# Patient Record
Sex: Male | Born: 1940 | Race: White | Hispanic: No | Marital: Married | Smoking: Former smoker
Health system: Southern US, Community
[De-identification: ages and names within clinical notes are randomized; demographics above are authoritative.]

## PROBLEM LIST (undated history)

## (undated) DIAGNOSIS — C61 Malignant neoplasm of prostate: Secondary | ICD-10-CM

## (undated) DIAGNOSIS — E119 Type 2 diabetes mellitus without complications: Secondary | ICD-10-CM

## (undated) DIAGNOSIS — G20A1 Parkinson's disease without dyskinesia, without mention of fluctuations: Secondary | ICD-10-CM

## (undated) DIAGNOSIS — I10 Essential (primary) hypertension: Secondary | ICD-10-CM

## (undated) DIAGNOSIS — D509 Iron deficiency anemia, unspecified: Secondary | ICD-10-CM

## (undated) DIAGNOSIS — K08109 Complete loss of teeth, unspecified cause, unspecified class: Secondary | ICD-10-CM

## (undated) DIAGNOSIS — Z972 Presence of dental prosthetic device (complete) (partial): Secondary | ICD-10-CM

## (undated) DIAGNOSIS — N200 Calculus of kidney: Secondary | ICD-10-CM

## (undated) DIAGNOSIS — G20 Parkinson's disease: Secondary | ICD-10-CM

## (undated) DIAGNOSIS — E785 Hyperlipidemia, unspecified: Secondary | ICD-10-CM

## (undated) DIAGNOSIS — K219 Gastro-esophageal reflux disease without esophagitis: Secondary | ICD-10-CM

## (undated) HISTORY — DX: Calculus of kidney: N20.0

## (undated) HISTORY — DX: Parkinson's disease without dyskinesia, without mention of fluctuations: G20.A1

## (undated) HISTORY — DX: Parkinson's disease: G20

## (undated) HISTORY — PX: UPPER GI ENDOSCOPY: SHX6162

## (undated) HISTORY — DX: Type 2 diabetes mellitus without complications: E11.9

## (undated) HISTORY — DX: Iron deficiency anemia, unspecified: D50.9

## (undated) HISTORY — DX: Hyperlipidemia, unspecified: E78.5

## (undated) HISTORY — DX: Essential (primary) hypertension: I10

## (undated) HISTORY — PX: COLONOSCOPY: SHX174

## (undated) HISTORY — DX: Gastro-esophageal reflux disease without esophagitis: K21.9

## (undated) HISTORY — PX: OTHER SURGICAL HISTORY: SHX169

## (undated) HISTORY — PX: ORIF RADIAL FRACTURE: SHX5113

## (undated) HISTORY — DX: Malignant neoplasm of prostate: C61

---

## 1999-10-27 ENCOUNTER — Encounter: Payer: Self-pay | Admitting: Emergency Medicine

## 1999-10-27 ENCOUNTER — Emergency Department (HOSPITAL_COMMUNITY): Admission: EM | Admit: 1999-10-27 | Discharge: 1999-10-27 | Payer: Self-pay | Admitting: Emergency Medicine

## 2007-03-16 ENCOUNTER — Inpatient Hospital Stay (HOSPITAL_COMMUNITY): Admission: EM | Admit: 2007-03-16 | Discharge: 2007-03-19 | Payer: Self-pay | Admitting: Emergency Medicine

## 2008-07-24 IMAGING — CT CT ENTERO ABD W/CM
2 of 4 series · 16 of 46 positions shown, 18 images · IV contrast (omnipaque)
Comparison: None.

ABDOMEN CT WITH CONTRAST: (Performed using CT Enterography protocol)

CLINICAL DATA: Abdominal pain. Nausea. Vomiting. GI bleed.
TECHNIQUE: Multidetector CT imaging of the abdomen and pelvis was performed
following administration of 8116cc of VoLumen low attenuation enteric contrast. 
Images were acquired after bolus injection of nonionic intravenous contrast with
coronal and sagittal reformations reviewed for assessment of the small bowel
mucosa.

Contrast:  125 cc Omnipaque 300

[Series 3: abd_pel 3.0 b40s do not sen · axial · 0.74mm/px · z∈[-461,-14]mm · 13 of 163 slices shown, 15 images]
[im 7/163  soft-tissue]
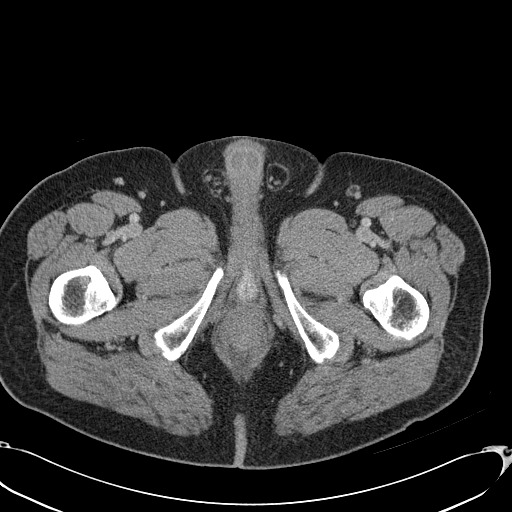
[im 7/163  bone]
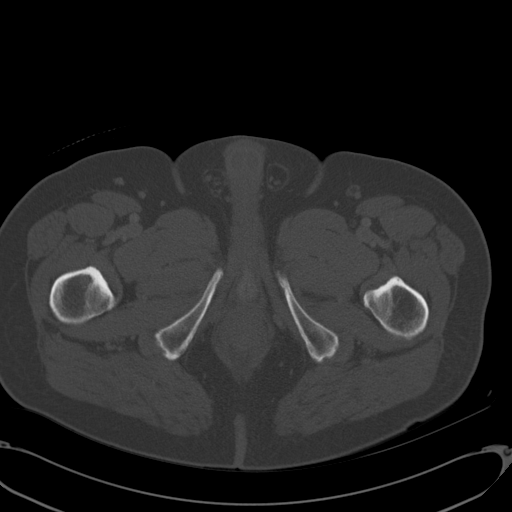
[im 19/163  soft-tissue]
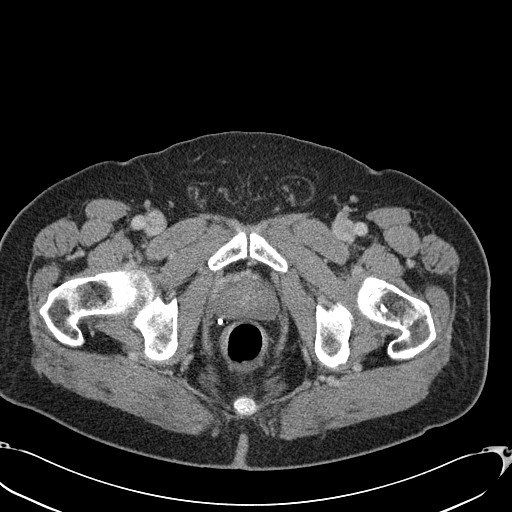
[im 32/163  soft-tissue]
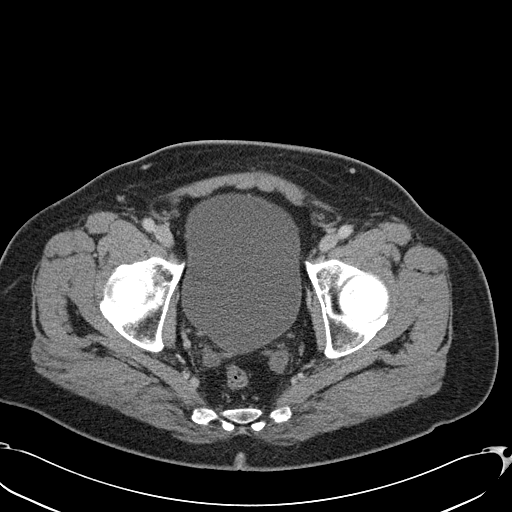
[im 44/163  soft-tissue]
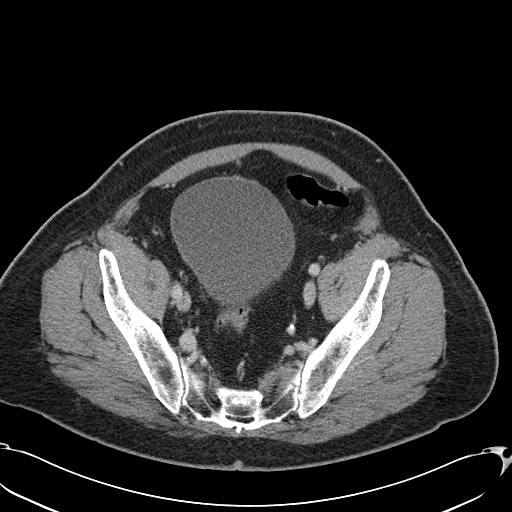
[im 57/163  soft-tissue]
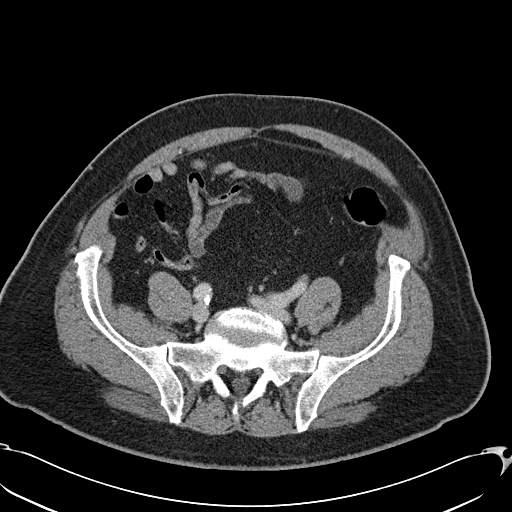
[im 69/163  soft-tissue]
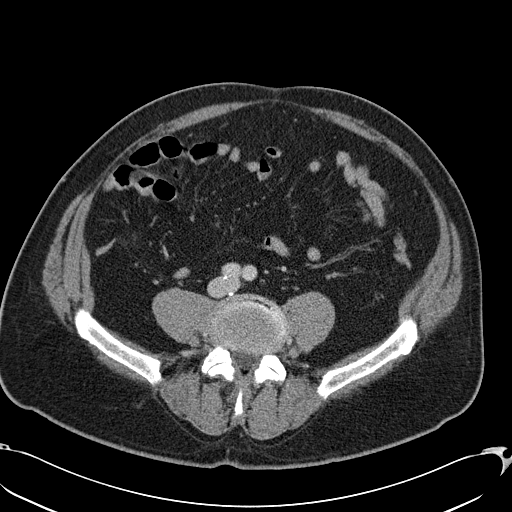
[im 82/163  soft-tissue]
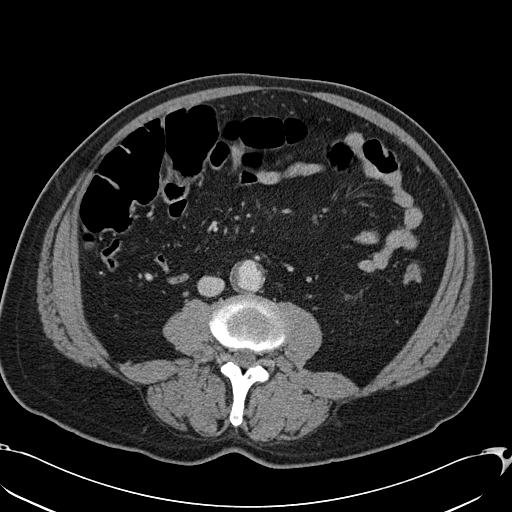
[im 94/163  soft-tissue]
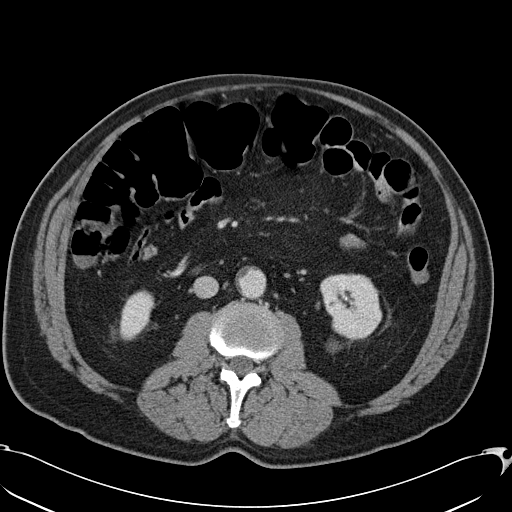
[im 106/163  soft-tissue]
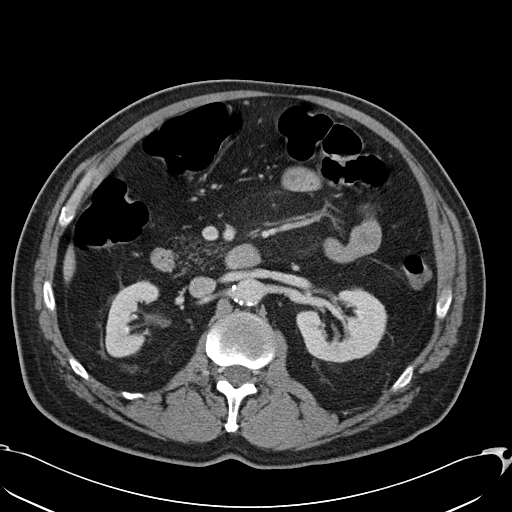
[im 106/163  bone]
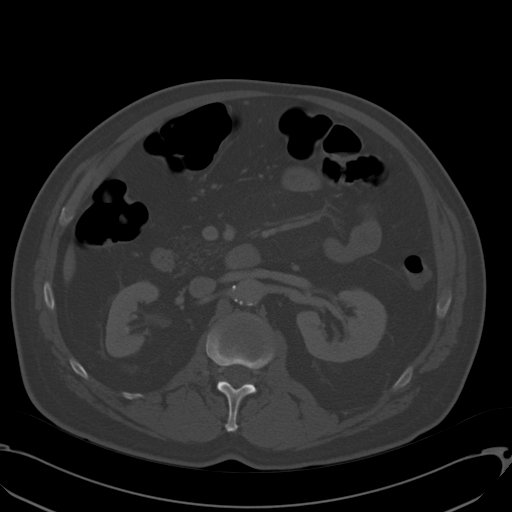
[im 119/163  soft-tissue]
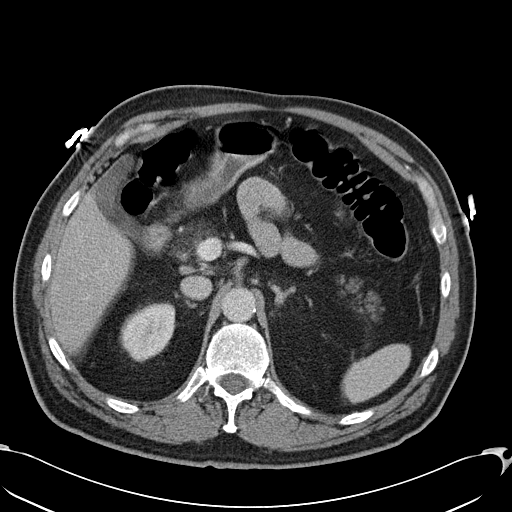
[im 131/163  soft-tissue]
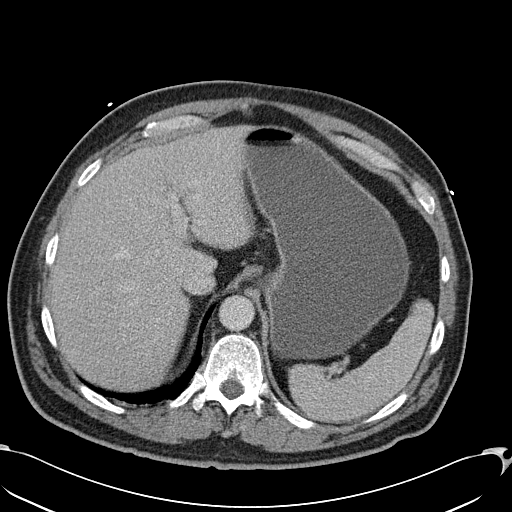
[im 144/163  soft-tissue]
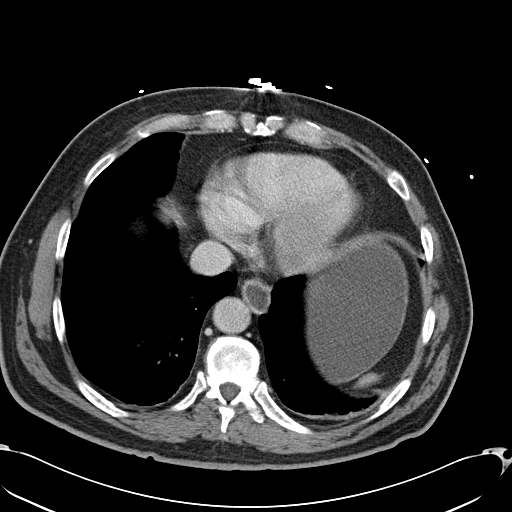
[im 156/163  soft-tissue]
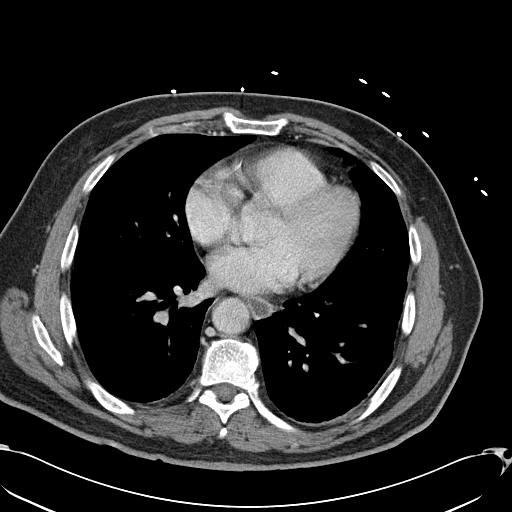

[Series 602: <mpr range> · coronal · 0.95mm/px · 3 of 133 slices shown]
[im 45/133  soft-tissue]
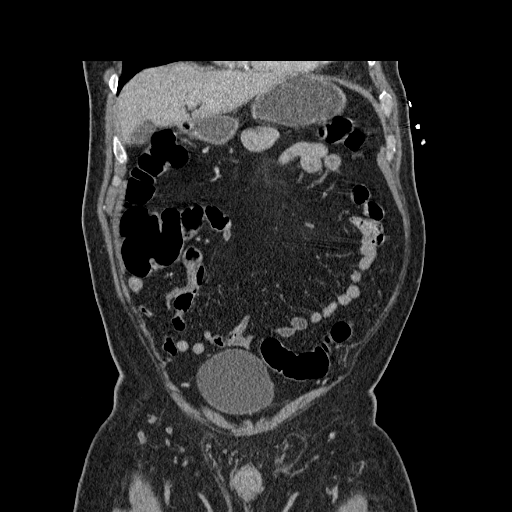
[im 59/133  soft-tissue]
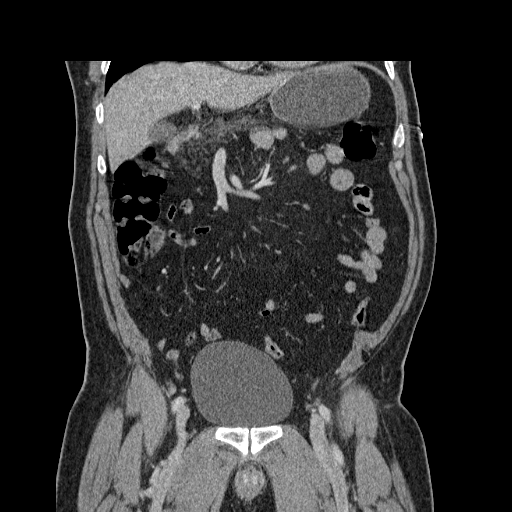
[im 74/133  soft-tissue]
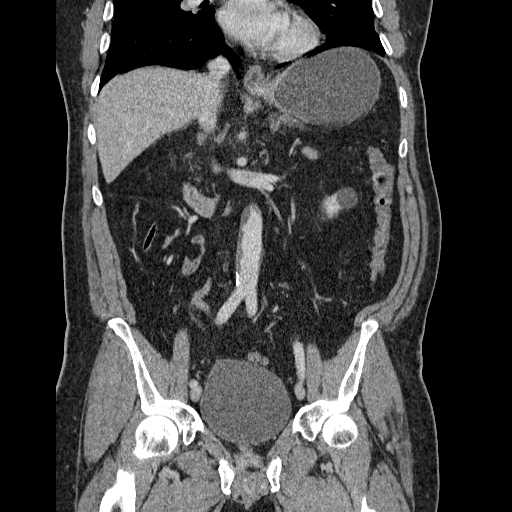

[16 of 46 positions shown; findings below may reference images not displayed]

FINDINGS: The stomach is prominently distended with oral contrast. There is oral
contrast within the visualized portions of the distal esophagus. The small bowel
is poorly distended with appearance suggesting most of the oral contrast agent
has remained in the stomach. Apparent wall thickening in the jejunum is diffuse
and there is no associated perienteric edema or inflammation. The appearance of
jejunal wall is felt to be related to underdistention. The ileal loops are
collapsed and otherwise unremarkable in appearance. The terminal ileum is
unremarkable. No areas of transmural enhancement identified within the small
bowel. Inner wall hyper enhancement may be obscured by the poor bowel
distention.

No focal abnormality is seen in the liver or spleen. The pancreas is diffusely
fatty calculated. Low attenuation diffusely in the pancreatic head is felt to be
a combination of associated fat and motion artifact. The gallbladder and adrenal
glands are unremarkable. Bilateral small nonobstructing renal calculi are
evident. Three cysts are identified in the lower pole of the left kidney.
Abdominal aorta measures up to 2.6 cm in diameter with prominent mural thrombus.
The origin of the celiac axis, superior mesenteric artery are widely patent.
There is calcification at the origin of the IMA, but it does opacify. Subtle
haziness in the root of the small bowel mesentery is a nonspecific finding.
IMPRESSION: Limited study in that all of the consumed oral contrast appears to still reside
within the stomach which is prominently distended. Functional or mechanical
gastric outlet obstruction is a consideration. Gastroparesis could also have
this appearance. There is fluid in the distal esophagus.

Bilateral nonobstructing renal calculi with left renal cysts.

PELVIS CT WITH CONTRAST:  (Performed using CT Enterography protocol)
FINDINGS: No intraperitoneal free fluid. No lymphadenopathy. The bladder is
moderately distended. No evidence for diverticulitis. The appendix is not
visualized.
IMPRESSION: No acute findings in the anatomic pelvis.

## 2008-08-02 ENCOUNTER — Ambulatory Visit (HOSPITAL_COMMUNITY): Admission: RE | Admit: 2008-08-02 | Discharge: 2008-08-02 | Payer: Self-pay | Admitting: Urology

## 2008-08-05 DIAGNOSIS — C61 Malignant neoplasm of prostate: Secondary | ICD-10-CM

## 2008-08-05 HISTORY — DX: Malignant neoplasm of prostate: C61

## 2008-08-30 ENCOUNTER — Ambulatory Visit: Admission: RE | Admit: 2008-08-30 | Discharge: 2008-09-29 | Payer: Self-pay | Admitting: Radiation Oncology

## 2008-09-15 ENCOUNTER — Ambulatory Visit: Payer: Self-pay | Admitting: Oncology

## 2008-09-15 ENCOUNTER — Emergency Department (HOSPITAL_COMMUNITY): Admission: EM | Admit: 2008-09-15 | Discharge: 2008-09-15 | Payer: Self-pay | Admitting: Emergency Medicine

## 2008-09-22 LAB — CBC & DIFF AND RETIC
BASO%: 0 % (ref 0.0–2.0)
Basophils Absolute: 0 10*3/uL (ref 0.0–0.1)
EOS%: 1.9 % (ref 0.0–7.0)
HCT: 27.8 % — ABNORMAL LOW (ref 38.7–49.9)
HGB: 9 g/dL — ABNORMAL LOW (ref 13.0–17.1)
IRF: 0.49 — ABNORMAL HIGH (ref 0.070–0.380)
MCH: 26.6 pg — ABNORMAL LOW (ref 28.0–33.4)
MCHC: 32.3 g/dL (ref 32.0–35.9)
MONO#: 0.8 10*3/uL (ref 0.1–0.9)
RDW: 19 % — ABNORMAL HIGH (ref 11.2–14.6)
RETIC #: 76.4 10*3/uL (ref 31.8–103.9)
WBC: 8.2 10*3/uL (ref 4.0–10.0)
lymph#: 2.4 10*3/uL (ref 0.9–3.3)

## 2008-09-28 LAB — SPEP & IFE WITH QIG
Albumin ELP: 55.4 % — ABNORMAL LOW (ref 55.8–66.1)
Alpha-1-Globulin: 4 % (ref 2.9–4.9)
Alpha-2-Globulin: 9.7 % (ref 7.1–11.8)
Beta 2: 7.2 % — ABNORMAL HIGH (ref 3.2–6.5)
Beta Globulin: 7.8 % — ABNORMAL HIGH (ref 4.7–7.2)
Gamma Globulin: 15.9 % (ref 11.1–18.8)
IgA: 637 mg/dL — ABNORMAL HIGH (ref 68–378)
IgG (Immunoglobin G), Serum: 1310 mg/dL (ref 694–1618)
IgM, Serum: 111 mg/dL (ref 60–263)
Total Protein, Serum Electrophoresis: 7.5 g/dL (ref 6.0–8.3)

## 2008-09-28 LAB — VITAMIN B12: Vitamin B-12: 467 pg/mL (ref 211–911)

## 2008-09-28 LAB — HEMOGLOBINOPATHY EVALUATION
Hgb A2 Quant: 2.3 % (ref 2.2–3.2)
Hgb A: 97.7 % (ref 96.8–97.8)
Hgb F Quant: 0 % (ref 0.0–2.0)
Hgb S Quant: 0 % (ref 0.0–0.0)

## 2008-09-28 LAB — FERRITIN: Ferritin: 3 ng/mL — ABNORMAL LOW (ref 22–322)

## 2008-09-28 LAB — KAPPA/LAMBDA LIGHT CHAINS: Kappa:Lambda Ratio: 0.64 (ref 0.26–1.65)

## 2008-09-28 LAB — IRON AND TIBC: %SAT: 5 % — ABNORMAL LOW (ref 20–55)

## 2008-09-28 LAB — COMPREHENSIVE METABOLIC PANEL
ALT: 10 U/L (ref 0–53)
AST: 15 U/L (ref 0–37)
Alkaline Phosphatase: 97 U/L (ref 39–117)
Creatinine, Ser: 1.2 mg/dL (ref 0.40–1.50)
Sodium: 138 mEq/L (ref 135–145)
Total Bilirubin: 0.3 mg/dL (ref 0.3–1.2)
Total Protein: 7.5 g/dL (ref 6.0–8.3)

## 2008-09-28 LAB — DIRECT ANTIGLOBULIN TEST (NOT AT ARMC)
DAT (Complement): NEGATIVE
DAT IgG: NEGATIVE

## 2008-09-28 LAB — JAK2 EXONS 12 & 13 MUTATION, REFLEX: JAK2 Exons 12 & 13: 13

## 2008-09-28 LAB — FOLATE: Folate: >20 ng/mL

## 2008-09-28 LAB — ERYTHROPOIETIN: Erythropoietin: 156 m[IU]/mL — ABNORMAL HIGH (ref 2.6–34.0)

## 2008-09-28 LAB — PNH PROFILE (-HIGH SENSITIVITY)
% CD55: 99.999 % (ref 99.995–100.000)
% PNH Cells: 0.001 % (ref 0.000–0.005)

## 2008-09-30 ENCOUNTER — Encounter (HOSPITAL_COMMUNITY): Admission: RE | Admit: 2008-09-30 | Discharge: 2008-12-08 | Payer: Self-pay | Admitting: Oncology

## 2008-09-30 LAB — CBC WITH DIFFERENTIAL/PLATELET
Basophils Absolute: 0.1 10*3/uL (ref 0.0–0.1)
Eosinophils Absolute: 0.2 10*3/uL (ref 0.0–0.5)
HCT: 25.4 % — ABNORMAL LOW (ref 38.4–49.9)
LYMPH%: 43.5 % (ref 14.0–49.0)
MCHC: 31.8 g/dL — ABNORMAL LOW (ref 32.0–36.0)
MONO#: 0.8 10*3/uL (ref 0.1–0.9)
NEUT%: 40.1 % (ref 39.0–75.0)
Platelets: 601 10*3/uL — ABNORMAL HIGH (ref 140–400)
WBC: 6.9 10*3/uL (ref 4.0–10.3)

## 2008-10-07 ENCOUNTER — Encounter: Admission: RE | Admit: 2008-10-07 | Discharge: 2008-10-07 | Payer: Self-pay | Admitting: Gastroenterology

## 2008-10-12 ENCOUNTER — Other Ambulatory Visit: Admission: RE | Admit: 2008-10-12 | Discharge: 2008-10-12 | Payer: Self-pay | Admitting: Oncology

## 2008-10-12 ENCOUNTER — Encounter: Payer: Self-pay | Admitting: Oncology

## 2008-10-12 LAB — CBC WITH DIFFERENTIAL/PLATELET
Basophils Absolute: 0 10*3/uL (ref 0.0–0.1)
EOS%: 1.9 % (ref 0.0–7.0)
HGB: 9.2 g/dL — ABNORMAL LOW (ref 13.0–17.1)
LYMPH%: 32.7 % (ref 14.0–49.0)
MCH: 27 pg — ABNORMAL LOW (ref 27.2–33.4)
MCV: 82.2 fL (ref 79.3–98.0)
MONO%: 9.2 % (ref 0.0–14.0)
Platelets: 516 10*3/uL — ABNORMAL HIGH (ref 140–400)
RBC: 3.4 10*6/uL — ABNORMAL LOW (ref 4.20–5.82)
RDW: 18.8 % — ABNORMAL HIGH (ref 11.0–14.6)

## 2008-10-19 ENCOUNTER — Encounter (INDEPENDENT_AMBULATORY_CARE_PROVIDER_SITE_OTHER): Payer: Self-pay | Admitting: Urology

## 2008-10-19 ENCOUNTER — Inpatient Hospital Stay (HOSPITAL_COMMUNITY): Admission: RE | Admit: 2008-10-19 | Discharge: 2008-10-20 | Payer: Self-pay | Admitting: Urology

## 2008-10-31 ENCOUNTER — Ambulatory Visit: Payer: Self-pay | Admitting: Oncology

## 2008-11-02 LAB — HOLD TUBE, BLOOD BANK

## 2008-11-02 LAB — CBC WITH DIFFERENTIAL/PLATELET
BASO%: 0.7 % (ref 0.0–2.0)
EOS%: 2.1 % (ref 0.0–7.0)
HCT: 36.6 % — ABNORMAL LOW (ref 38.4–49.9)
LYMPH%: 31.1 % (ref 14.0–49.0)
MCH: 26.9 pg — ABNORMAL LOW (ref 27.2–33.4)
MCHC: 32.2 g/dL (ref 32.0–36.0)
MONO#: 0.7 10*3/uL (ref 0.1–0.9)
MONO%: 8 % (ref 0.0–14.0)
NEUT%: 58.1 % (ref 39.0–75.0)
Platelets: 618 10*3/uL — ABNORMAL HIGH (ref 140–400)
RBC: 4.39 10*6/uL (ref 4.20–5.82)
WBC: 9 10*3/uL (ref 4.0–10.3)

## 2008-11-16 LAB — CBC WITH DIFFERENTIAL/PLATELET
Basophils Absolute: 0 10*3/uL (ref 0.0–0.1)
Eosinophils Absolute: 0.2 10*3/uL (ref 0.0–0.5)
HGB: 12.3 g/dL — ABNORMAL LOW (ref 13.0–17.1)
NEUT#: 2.8 10*3/uL (ref 1.5–6.5)
RDW: 20.2 % — ABNORMAL HIGH (ref 11.0–14.6)
lymph#: 3.2 10*3/uL (ref 0.9–3.3)

## 2008-12-01 LAB — CBC WITH DIFFERENTIAL/PLATELET
Eosinophils Absolute: 0.2 10*3/uL (ref 0.0–0.5)
HCT: 34.3 % — ABNORMAL LOW (ref 38.4–49.9)
HGB: 11.3 g/dL — ABNORMAL LOW (ref 13.0–17.1)
LYMPH%: 22 % (ref 14.0–49.0)
MONO#: 1 10*3/uL — ABNORMAL HIGH (ref 0.1–0.9)
NEUT#: 6 10*3/uL (ref 1.5–6.5)
NEUT%: 64.2 % (ref 39.0–75.0)
Platelets: 419 10*3/uL — ABNORMAL HIGH (ref 140–400)
WBC: 9.3 10*3/uL (ref 4.0–10.3)

## 2008-12-01 LAB — FERRITIN: Ferritin: 58 ng/mL (ref 22–322)

## 2008-12-01 LAB — HOLD TUBE, BLOOD BANK

## 2008-12-01 LAB — IRON AND TIBC
TIBC: 283 ug/dL (ref 215–435)
UIBC: 205 ug/dL

## 2009-01-27 ENCOUNTER — Ambulatory Visit: Payer: Self-pay | Admitting: Oncology

## 2009-01-31 LAB — IRON AND TIBC: %SAT: 30 % (ref 20–55)

## 2009-01-31 LAB — CBC WITH DIFFERENTIAL/PLATELET
Basophils Absolute: 0 10*3/uL (ref 0.0–0.1)
EOS%: 1 % (ref 0.0–7.0)
Eosinophils Absolute: 0.1 10*3/uL (ref 0.0–0.5)
HCT: 40.1 % (ref 38.4–49.9)
HGB: 13.9 g/dL (ref 13.0–17.1)
MCH: 32 pg (ref 27.2–33.4)
MONO#: 0.8 10*3/uL (ref 0.1–0.9)
NEUT#: 4.4 10*3/uL (ref 1.5–6.5)
NEUT%: 50.3 % (ref 39.0–75.0)
RDW: 16.6 % — ABNORMAL HIGH (ref 11.0–14.6)
lymph#: 3.4 10*3/uL — ABNORMAL HIGH (ref 0.9–3.3)

## 2009-01-31 LAB — FERRITIN: Ferritin: 24 ng/mL (ref 22–322)

## 2009-03-28 ENCOUNTER — Ambulatory Visit: Payer: Self-pay | Admitting: Oncology

## 2009-05-26 ENCOUNTER — Ambulatory Visit: Payer: Self-pay | Admitting: Oncology

## 2009-05-31 LAB — IRON AND TIBC
%SAT: 29 % (ref 20–55)
Iron: 86 ug/dL (ref 42–165)

## 2009-05-31 LAB — COMPREHENSIVE METABOLIC PANEL
ALT: 15 U/L (ref 0–53)
AST: 18 U/L (ref 0–37)
CO2: 23 mEq/L (ref 19–32)
Chloride: 101 mEq/L (ref 96–112)
Sodium: 137 mEq/L (ref 135–145)
Total Bilirubin: 0.6 mg/dL (ref 0.3–1.2)
Total Protein: 7.4 g/dL (ref 6.0–8.3)

## 2009-05-31 LAB — CBC WITH DIFFERENTIAL/PLATELET
BASO%: 0.5 % (ref 0.0–2.0)
EOS%: 1.7 % (ref 0.0–7.0)
LYMPH%: 37.4 % (ref 14.0–49.0)
MCH: 33 pg (ref 27.2–33.4)
MCHC: 33.9 g/dL (ref 32.0–36.0)
MONO#: 1 10*3/uL — ABNORMAL HIGH (ref 0.1–0.9)
RBC: 4.18 10*6/uL — ABNORMAL LOW (ref 4.20–5.82)
WBC: 8.1 10*3/uL (ref 4.0–10.3)
lymph#: 3 10*3/uL (ref 0.9–3.3)

## 2009-05-31 LAB — FERRITIN: Ferritin: 50 ng/mL (ref 22–322)

## 2009-07-25 ENCOUNTER — Ambulatory Visit: Payer: Self-pay | Admitting: Oncology

## 2009-07-27 LAB — IRON AND TIBC
%SAT: 37 % (ref 20–55)
TIBC: 336 ug/dL (ref 215–435)
UIBC: 213 ug/dL

## 2009-07-27 LAB — CBC WITH DIFFERENTIAL/PLATELET
Basophils Absolute: 0 10*3/uL (ref 0.0–0.1)
EOS%: 0.6 % (ref 0.0–7.0)
Eosinophils Absolute: 0.1 10*3/uL (ref 0.0–0.5)
HCT: 44.3 % (ref 38.4–49.9)
HGB: 15.1 g/dL (ref 13.0–17.1)
MCH: 32.9 pg (ref 27.2–33.4)
MCV: 96.8 fL (ref 79.3–98.0)
MONO%: 8.1 % (ref 0.0–14.0)
NEUT#: 7 10*3/uL — ABNORMAL HIGH (ref 1.5–6.5)
NEUT%: 65.2 % (ref 39.0–75.0)
RDW: 12.9 % (ref 11.0–14.6)
lymph#: 2.8 10*3/uL (ref 0.9–3.3)

## 2009-07-27 LAB — COMPREHENSIVE METABOLIC PANEL
AST: 23 U/L (ref 0–37)
Albumin: 4.5 g/dL (ref 3.5–5.2)
BUN: 18 mg/dL (ref 6–23)
Calcium: 10.1 mg/dL (ref 8.4–10.5)
Chloride: 100 mEq/L (ref 96–112)
Creatinine, Ser: 0.96 mg/dL (ref 0.40–1.50)
Glucose, Bld: 129 mg/dL — ABNORMAL HIGH (ref 70–99)
Potassium: 4 mEq/L (ref 3.5–5.3)

## 2010-02-23 IMAGING — CR DG CHEST 2V
2 series · 2 of 2 positions shown · non-contrast
Comparison: 03/16/2007

CLINICAL DATA: Prostate cancer, preop.

CHEST - 2 VIEW

[w chest pa]
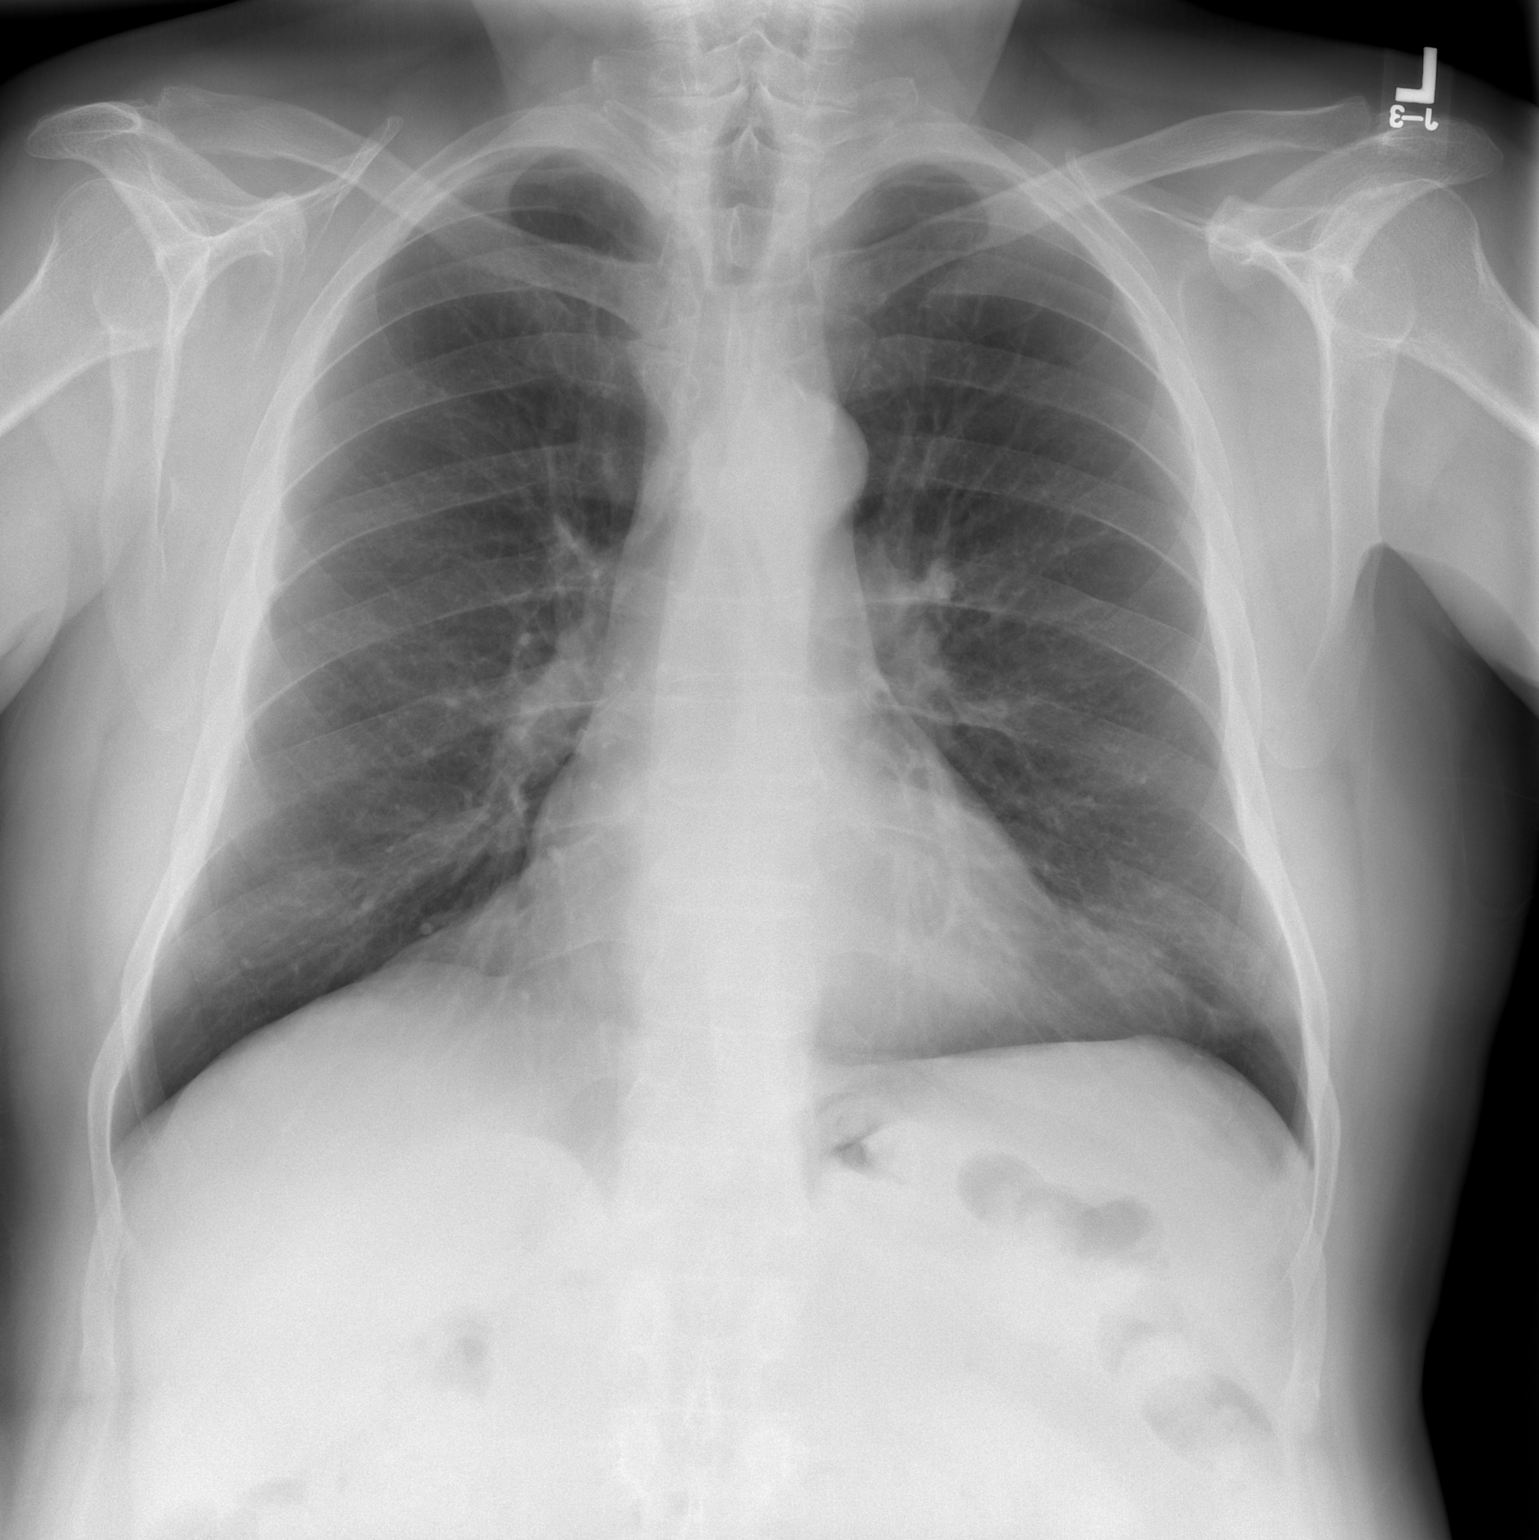

[w chest lat]
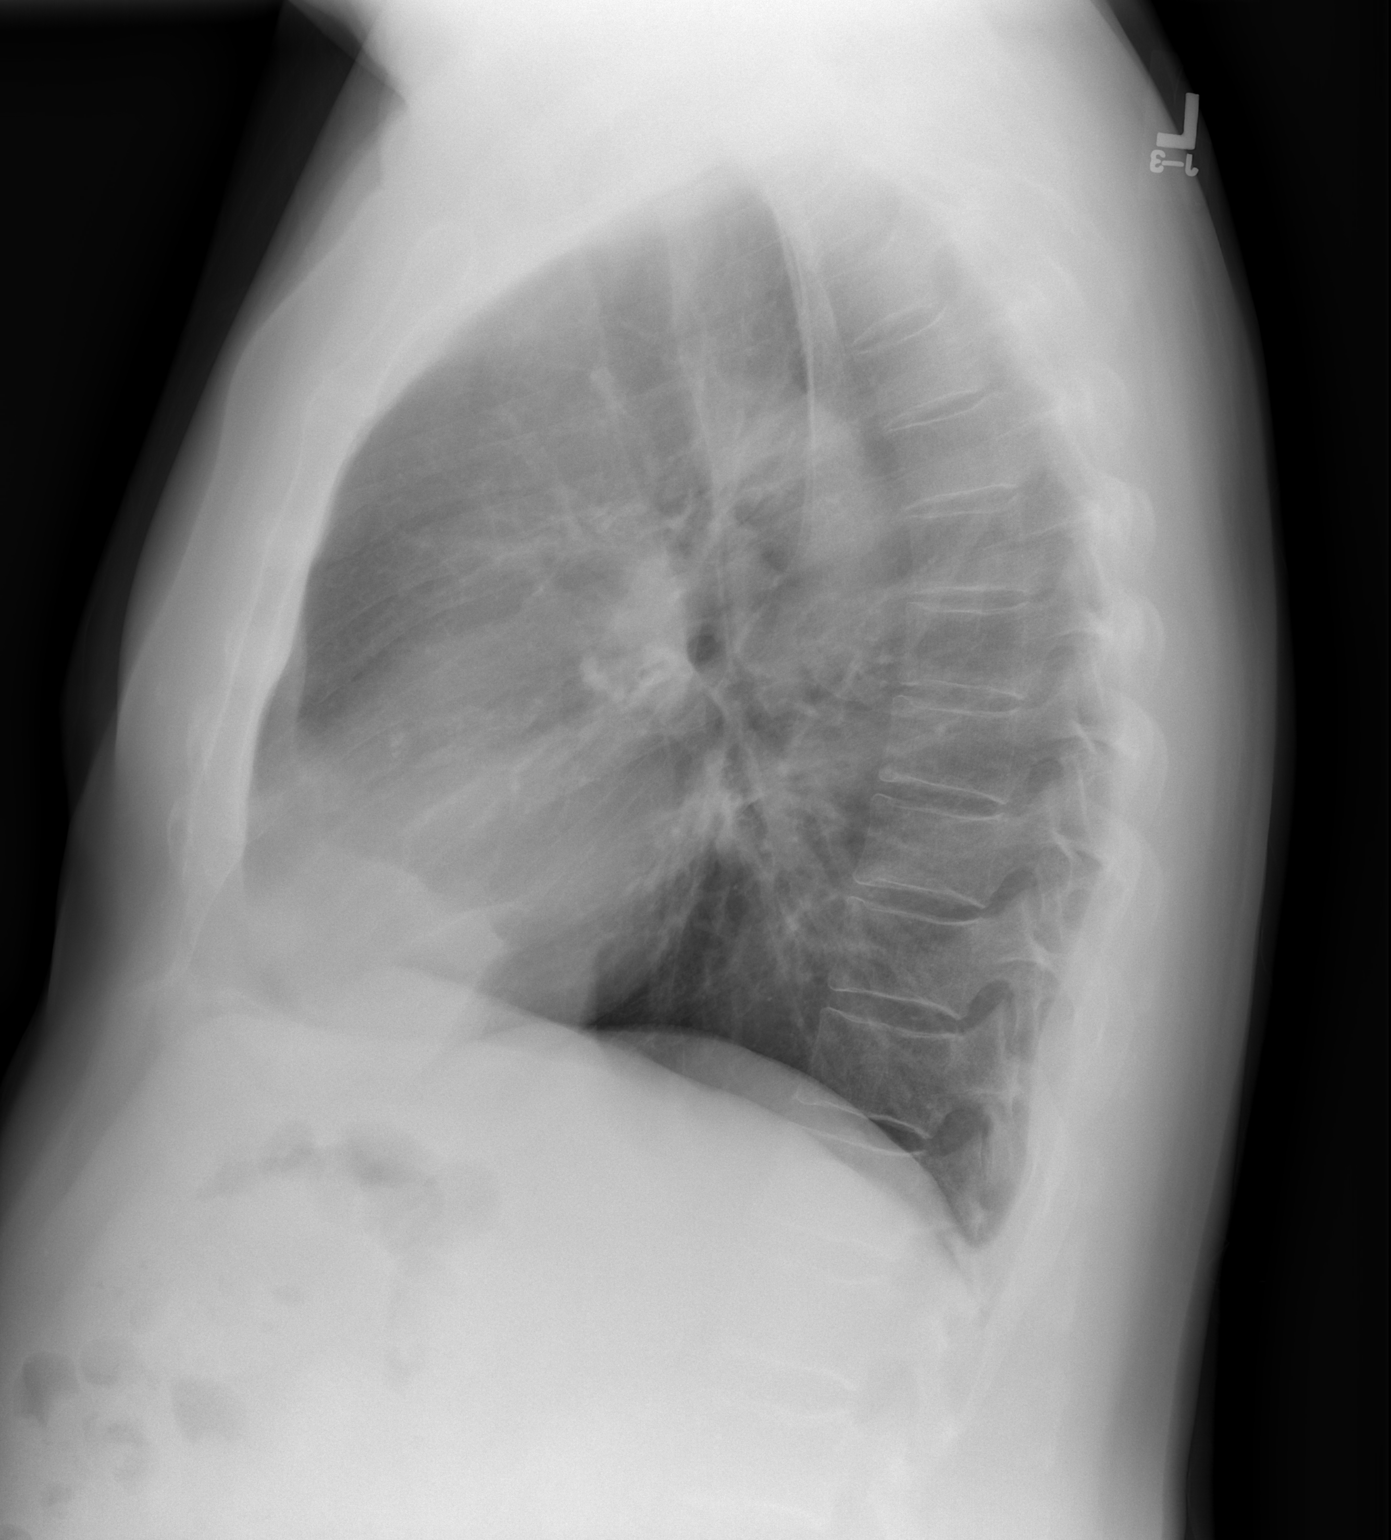

[2 of 2 positions shown; findings below may reference images not displayed]

FINDINGS: Heart and mediastinal contours are within normal limits.
No focal opacities or effusions.  No acute bony abnormality.
IMPRESSION: No active disease.

## 2010-03-21 ENCOUNTER — Encounter: Payer: Self-pay | Admitting: Cardiology

## 2010-04-05 ENCOUNTER — Ambulatory Visit: Payer: Self-pay | Admitting: Cardiology

## 2010-04-05 DIAGNOSIS — E1159 Type 2 diabetes mellitus with other circulatory complications: Secondary | ICD-10-CM | POA: Insufficient documentation

## 2010-04-05 DIAGNOSIS — R9431 Abnormal electrocardiogram [ECG] [EKG]: Secondary | ICD-10-CM | POA: Insufficient documentation

## 2010-04-05 DIAGNOSIS — E663 Overweight: Secondary | ICD-10-CM | POA: Insufficient documentation

## 2010-04-05 DIAGNOSIS — R5383 Other fatigue: Secondary | ICD-10-CM

## 2010-04-05 DIAGNOSIS — E1169 Type 2 diabetes mellitus with other specified complication: Secondary | ICD-10-CM | POA: Insufficient documentation

## 2010-04-05 DIAGNOSIS — I10 Essential (primary) hypertension: Secondary | ICD-10-CM | POA: Insufficient documentation

## 2010-04-05 DIAGNOSIS — I152 Hypertension secondary to endocrine disorders: Secondary | ICD-10-CM | POA: Insufficient documentation

## 2010-04-05 DIAGNOSIS — R5381 Other malaise: Secondary | ICD-10-CM | POA: Insufficient documentation

## 2010-04-05 DIAGNOSIS — E785 Hyperlipidemia, unspecified: Secondary | ICD-10-CM | POA: Insufficient documentation

## 2010-09-04 NOTE — Assessment & Plan Note (Signed)
Summary: np6/rbbb/jml   Visit Type:  Initial Consult Primary Helana Macbride:  Paul [name], PAc  CC:  abnormal ekg.  History of Present Illness: The she presents for evaluation of an abnormal EKG.  He was recently noted to have a right bundle branch block on routine followup. He has no prior cardiac history. He has never had any cardiac testing. He is an active gentleman working in his garden and around his house though he does not exercise routinely. With this level of activity he denies any chest pressure, neck or arm discomfort. He has no palpitations, presyncope or syncope. He has no PND or orthopnea. He denies any shortness of breath with activity. He has no weight gain or swelling. Of note I don't have an old EKG for comparison.  Preventive Screening-Counseling & Management  Alcohol-Tobacco     Smoking Status: quit     Year Quit: 1980  Current Medications (verified): 1)  Lisinopril 20 Mg Tabs (Lisinopril) .... Take 1 Tablet By Mouth Once A Day 2)  Aspir-Low 81 Mg Tbec (Aspirin) .... Take 1 Tablet By Mouth Once A Day 3)  Amlodipine Besylate 5 Mg Tabs (Amlodipine Besylate) .... Take 1 Tablet By Mouth Once A Day 4)  Lipitor 40 Mg Tabs (Atorvastatin Calcium) .... Take 1 Tablet By Mouth Once A Day 5)  Multivitamins  Tabs (Multiple Vitamin) .... Take 1 Tablet By Mouth Once A Day 6)  Fish Oil 1200 Mg Caps (Omega-3 Fatty Acids) .... Take 1 Tablet By Mouth Once A Day 7)  Drisdol 01027 Unit Caps (Ergocalciferol) .... Take One By Mouth Weekly  Allergies (verified): 1)  ! Sulfa 2)  ! * Norvasc 10mg   Comments:  Nurse/Medical Assistant: The patient's medication list and allergies were reviewed with the patient and were updated in the Medication and Allergy Lists.  Past History:  Past Medical History: Hypertension x 5 years Hyperlipidemia x 5 years Kdney stones Low-grade adenocarcinoma of the prostate.   Past Surgical History: Robotic-assisted laparoscopic radical  prostatectomy and  bilateral pelvic lymphadenectomy.     Family History: Mother died at 102 years of age of heart failure, diabetes mellitus.  Father died at 5 years of age of heart attack. Brother with stents 60.     Social History: Married, Tobacco x 33 years 1 ppd quit 1983, No EtOHSmoking Status:  quit  Review of Systems       As stated in the HPI and negative for all other systems.   Vital Signs:  Patient profile:   70 year old male Height:      68 inches Weight:      199 pounds BMI:     30.37 Pulse rate:   90 / minute BP sitting:   119 / 73  (left arm) Cuff size:   large  Vitals Entered By: Carlye Grippe (April 05, 2010 1:03 PM)  Nutrition Counseling: Patient's BMI is greater than 25 and therefore counseled on weight management options. CC: abnormal ekg   Physical Exam  General:  Well developed, well nourished, in no acute distress. Head:  normocephalic and atraumatic Eyes:  PERRLA/EOM intact; conjunctiva and lids normal. Neck:  Neck supple, no JVD. No masses, thyromegaly or abnormal cervical nodes. Chest Wall:  no deformities or breast masses noted Lungs:  Clear bilaterally to auscultation and percussion. Abdomen:  Bowel sounds positive; abdomen soft and non-tender without masses, organomegaly, or hernias noted. No hepatosplenomegaly. Msk:  Back normal, normal gait. Muscle strength and tone normal. Extremities:  No clubbing  or cyanosis. Neurologic:  Alert and oriented x 3. Skin:  Intact without lesions or rashes. Cervical Nodes:  no significant adenopathy Axillary Nodes:  no significant adenopathy Inguinal Nodes:  no significant adenopathy Psych:  Normal affect.   Detailed Cardiovascular Exam  Neck    Carotids: Carotids full and equal bilaterally without bruits.      Neck Veins: Normal, no JVD.    Heart    Inspection: no deformities or lifts noted.      Palpation: normal PMI with no thrills palpable.      Auscultation: regular rate and rhythm, S1, S2 without  murmurs, rubs, gallops, or clicks.    Vascular    Abdominal Aorta: no palpable masses, pulsations, or audible bruits.      Femoral Pulses: normal femoral pulses bilaterally.      Pedal Pulses: normal pedal pulses bilaterally.      Radial Pulses: normal radial pulses bilaterally.      Peripheral Circulation: no clubbing, cyanosis, or edema noted with normal capillary refill.     EKG  Procedure date:  04/05/2010  Findings:      Sinus rhythm, rate 69,right bundle branch block, right axis deviation, no acute ST-T wave changes  Impression & Recommendations:  Problem # 1:  ABNORMAL ELECTROCARDIOGRAM (ICD-794.31) The patient has a right bundle branch block which I assume is new. However, he has no other conduction abnormalities. He has a normal physical examination and no symptoms. In this situation further structural heart disease is not suspected. No further cardiovascular testing is suggested.  Problem # 2:  OVERWEIGHT (ICD-278.02) We discussed the need for weight loss with diet and exercise.  Problem # 3:  ESSENTIAL HYPERTENSION, BENIGN (ICD-401.1) His blood pressure is controlled. He will continue the meds as listed.  Problem # 4:  HYPERLIPIDEMIA-MIXED (ICD-272.4) I reviewed his lipid profile from the outside. While his LDL is not significantly out of range his particle size is not optimal. I agree with current management and aggressive followup.  Patient Instructions: 1)  No further cardiac work up needed.

## 2010-09-04 NOTE — Letter (Signed)
Summary: External Correspondence/ [facility]  External Correspondence/ [facility]   Imported By: Dorise Hiss 03/27/2010 12:37:45  _____________________________________________________________________  External Attachment:    Type:   Image     Comment:   External Document

## 2010-11-15 LAB — COMPREHENSIVE METABOLIC PANEL
ALT: 17 U/L (ref 0–53)
AST: 23 U/L (ref 0–37)
Alkaline Phosphatase: 81 U/L (ref 39–117)
CO2: 28 mEq/L (ref 19–32)
Calcium: 9.5 mg/dL (ref 8.4–10.5)
Chloride: 99 mEq/L (ref 96–112)
GFR calc Af Amer: >60 mL/min (ref >60)
GFR calc non Af Amer: >60 mL/min (ref >60)
Glucose, Bld: 108 mg/dL — ABNORMAL HIGH (ref 70–99)
Potassium: 3.7 mEq/L (ref 3.5–5.1)
Sodium: 136 mEq/L (ref 135–145)
Total Bilirubin: 0.6 mg/dL (ref 0.3–1.2)

## 2010-11-15 LAB — CHROMOSOME ANALYSIS, BONE MARROW

## 2010-11-15 LAB — BASIC METABOLIC PANEL
BUN: 11 mg/dL (ref 6–23)
BUN: 16 mg/dL (ref 6–23)
Calcium: 8.5 mg/dL (ref 8.4–10.5)
Creatinine, Ser: 1.09 mg/dL (ref 0.4–1.5)
GFR calc non Af Amer: >60 mL/min (ref >60)
GFR calc non Af Amer: >60 mL/min (ref >60)
Glucose, Bld: 140 mg/dL — ABNORMAL HIGH (ref 70–99)
Potassium: 3.9 mEq/L (ref 3.5–5.1)
Potassium: 4.4 mEq/L (ref 3.5–5.1)

## 2010-11-15 LAB — HEMOGLOBIN AND HEMATOCRIT, BLOOD
HCT: 29.7 % — ABNORMAL LOW (ref 39.0–52.0)
Hemoglobin: 9.7 g/dL — ABNORMAL LOW (ref 13.0–17.0)

## 2010-11-15 LAB — CBC
HCT: 23.6 % — ABNORMAL LOW (ref 39.0–52.0)
HCT: 28.5 % — ABNORMAL LOW (ref 39.0–52.0)
Hemoglobin: 9.4 g/dL — ABNORMAL LOW (ref 13.0–17.0)
MCHC: 32.3 g/dL (ref 30.0–36.0)
MCV: 83.1 fL (ref 78.0–100.0)
MCV: 83.9 fL (ref 78.0–100.0)
Platelets: 421 10*3/uL — ABNORMAL HIGH (ref 150–400)
Platelets: 522 10*3/uL — ABNORMAL HIGH (ref 150–400)
RBC: 3.4 MIL/uL — ABNORMAL LOW (ref 4.22–5.81)
RBC: 3.52 MIL/uL — ABNORMAL LOW (ref 4.22–5.81)
RDW: 20 % — ABNORMAL HIGH (ref 11.5–15.5)
WBC: 10 10*3/uL (ref 4.0–10.5)
WBC: 6.8 10*3/uL (ref 4.0–10.5)
WBC: 7.3 10*3/uL (ref 4.0–10.5)

## 2010-11-15 LAB — DIFFERENTIAL
Eosinophils Absolute: 0.1 10*3/uL (ref 0.0–0.7)
Eosinophils Relative: 2 % (ref 0–5)
Lymphocytes Relative: 16 % (ref 12–46)
Lymphocytes Relative: 34 % (ref 12–46)
Lymphs Abs: 1.6 10*3/uL (ref 0.7–4.0)
Lymphs Abs: 2.4 10*3/uL (ref 0.7–4.0)
Monocytes Relative: 9 % (ref 3–12)
Neutrophils Relative %: 71 % (ref 43–77)
Neutrophils Relative %: 56 % (ref 43–77)

## 2010-11-15 LAB — URINALYSIS, ROUTINE W REFLEX MICROSCOPIC
Bilirubin Urine: NEGATIVE
Nitrite: NEGATIVE
Specific Gravity, Urine: 1.008 (ref 1.005–1.030)
pH: 7 (ref 5.0–8.0)

## 2010-11-15 LAB — PROTIME-INR: Prothrombin Time: 13.2 seconds (ref 11.6–15.2)

## 2010-11-15 LAB — TYPE AND SCREEN: ABO/RH(D): O POS

## 2010-11-20 LAB — CROSSMATCH

## 2010-12-18 NOTE — Discharge Summary (Signed)
NAME:  Paul [name], KINA NO.:  [id number]   MEDICAL RECORD NO.:  [id number]          PATIENT TYPE:  INP   LOCATION:  1406                         FACILITY:  [facility]   PHYSICIAN:  Lonia Blood, M.D.      DATE OF BIRTH:  1940/11/19   DATE OF ADMISSION:  03/16/2007  DATE OF DISCHARGE:  03/19/2007                               DISCHARGE SUMMARY   PRIMARY CARE PHYSICIAN:  Dr. Molly Maduro Day.   DISCHARGE DIAGNOSIS:  1. Profound anemia, profound etiology.  2. Hypertension.  3. Hyperlipidemia.  4. Kidney stones.   DISCHARGE MEDICATIONS:  Vytorin 1 tablet daily, amlodipine/benazepril  50/20 1 tablet daily, multivitamin daily, aspirin 1 mg daily and iron  tablet daily.   DISPOSITION:  The patient was followed with Dr. Morrie Sheldon in about a week.  He  subsequently had a hemoglobin followup.  If he continues to drop he will  need to have hematology workup.  At this point there is no evidence of  GI source of bleeding.   PROCEDURES PERFORMED:  This admission.  Chest x-ray on March 16, 2007  showed no acute cardiopulmonary findings.  CT entero, abdomen and pelvis  on March 18, 2007 showed limited study, but not negative finding except  for bilateral nonobstructive renal calculi and left renal cyst.  Also  EGD and colonoscopy on March 18, 2007 by Dr. Herbert Moors was negative.   CONSULTATIONS:  Dr. Evette Cristal, gastroenterologist.   BRIEF HISTORY AND PHYSICAL:  Please refer to dictated history and  physical on March 16, 2007 by Dr. Elliot Cousin.  In short however,  this is a 70 year old gentleman that presented with weakness and some  elements of dizziness.  He was seen in the emergency room, and initial  evaluation showed that he was profoundly anemic with hemoglobin of 7.4.  He was heme-negative.  Anemia was microcytic with an MCV of 70.  The  patient was subsequently admitted for further workup of microcytic  anemia.   HOSPITAL COURSE:  1. Profound microcytic anemia.  The first  impression was this is      likely to be GI based on the patient's age, and the fact that this      is microcytic anemia.  The patient's guaiac was negative as      indicated.  He received 2 units of packed red blood cells and also      a GI consult.  He had workup done including EGD, colonoscopy and CT      scan arthroscopy which are all negative.  This indicated his source      of bleeding is likely to be GI  based on these studies.  We will      now discharge him, and his hemoglobin will be followed as an      outpatient.  If he continues dropping, hematology workup is      warranted.  2. Hypertension.  His blood pressure was well-controlled his home      medications.  3. Dyslipidemia.  The patient was continued on Vytorin once his oral  intake resumes.  4. Hypokalemia and hyponatremia.  Again, he had transient hypokalemia      and hyponatremia on admission with potassium 3.4 and a sodium of      131.  This was likely secondary to dehydration which has since      resolved.  Otherwise, patient quite stable for discharge.      Lonia Blood, M.D.  Electronically Signed     LG/MEDQ  D:  03/19/2007  T:  03/20/2007  Job:  [id number]

## 2010-12-18 NOTE — Op Note (Signed)
NAME:  Paul [name], Paul [name] NO.:  [id number]   MEDICAL RECORD NO.:  [id number]          PATIENT TYPE:  INP   LOCATION:  1406                         FACILITY:  [facility]   PHYSICIAN:  Graylin Shiver, M.D.   DATE OF BIRTH:  Feb 22, 1941   DATE OF PROCEDURE:  03/18/2007  DATE OF DISCHARGE:                               OPERATIVE REPORT   PROCEDURE PERFORMED:  Esophagogastroduodenoscopy.   INDICATIONS:  Anemia.  Informed consent was obtained after explanation  of the risks of bleeding, infection and perforation.   PREMEDICATION:  Fentanyl 50 mcg IV, Versed 4 mg IV.   PROCEDURE:  With the patient in the left lateral decubitus position, the  Pentax gastroscope was inserted into the oropharynx and passed into the  esophagus. It was advanced down the esophagus then into the stomach and  into the duodenum.  The second portion and bulb of the duodenum were  normal.  The stomach looked normal in its entirety.  The esophagus  looked normal in its entirety.  He tolerated the procedure well without  complications.   IMPRESSION:  Normal esophagogastroduodenoscopy.           ______________________________  Graylin Shiver, M.D.     SFG/MEDQ  D:  03/18/2007  T:  03/19/2007  Job:  [id number]

## 2010-12-18 NOTE — H&P (Signed)
NAME:  RAKIN, LEMELLE NO.:  [id number]   MEDICAL RECORD NO.:  [id number]          PATIENT TYPE:  INP   LOCATION:  0107                         FACILITY:  [facility]   PHYSICIAN:  Elliot Cousin, M.D.    DATE OF BIRTH:  August 28, 1940   DATE OF ADMISSION:  03/16/2007  DATE OF DISCHARGE:                              HISTORY & PHYSICAL   PRIMARY CARE PHYSICIAN:  Alfredia Client, MD, [facility].   CHIEF COMPLAINT:  Swimmy headed and weak.   HISTORY OF PRESENT ILLNESS:  The patient is a 70 year old man with a  past medical history significant for hypertension and hyperlipidemia,  who presents to the emergency department with the Chief Complaint of  feeling swimmy headed and weak.  His symptoms started approximately a  week to two weeks ago.  He also complains of no energy.  He has had  intermittent blurred vision but no headache or syncopal episodes.  He  had a sore throat which radiated to the chest while he was eating dinner  yesterday.  He has had a mild productive cough with clear and greenish  sputum.  He denies associated fever and chills.  He has had no abdominal  pain, although he has had nausea.  He denies vomiting.  He denies  diarrhea and constipation.  The stools have been mostly brown with the  exception on one which was maroon colored yesterday.  He says that he  has hemorrhoids.  He denies shortness of breath, swelling in the legs,  and weight loss.  He denies using NSAIDs frequently.  He generally takes  Tylenol if needed for pain.  He also denies alcohol use.   The patient presented to his primary care physician today.  Ms. Bevelyn Buckles  evaluated the patient in the office.  At that time, the patient was  hemodynamically stable, and his abdomen was nontender per exam.  Ms.  Bevelyn Buckles performed a rectal exam, and his stool was apparently heme  negative.  Ms. Bevelyn Buckles did see small external hemorrhoids.  A CBC was  ordered.  His hemoglobin was  found to be 6.4 with an MCV of 71.5.  The  patient was advised to come to the emergency department for further  evaluation.   When the patient arrived to the emergency department, he was found to be  hemodynamically stable.  His hemoglobin per the CBC in the emergency  department is 7.0.  The patient will be admitted for further evaluation  and management.   PAST MEDICAL HISTORY:  1. Hypertension.  2. Hyperlipidemia.  3. Kidney stones.   MEDICATIONS:  1. Amlodipine/benazepril 5/20 mg daily.  2. Vytorin 10/20 mg daily.  3. Multivitamins once daily.  4. Aspirin 81 mg daily.   ALLERGIES:  No known drug allergies.   SOCIAL HISTORY:  The patient is married. [place]  He has one son.  He is retired.  He stopped smoking 25 years  ago.  He denies alcohol use.   FAMILY HISTORY:  His mother died at 70 years of age of heart  failure.  She also had diabetes mellitus.  His father died at 98 years of age of  heart attack.   REVIEW OF SYSTEMS:  Otherwise negative.   PHYSICAL EXAMINATION:  VITAL SIGNS:  Temperature 98.9, blood pressure  151/84, pulse 93, respiratory rate 20, oxygen saturation 97% on room  air.  GENERAL:  The patient is a pleasant, 70 year old,  Caucasian man who is  currently lying in bed in no acute distress.  HEENT:  Head is normocephalic and atraumatic.  Pupils equal, round, and  reactive to light. Extraocular movements are intact.  Conjunctivae are  clear, sclerae white.  Nasal mucosa is mildly dry.  No sinus tenderness.  Oropharynx reveals a full set of dentures. Mucous membranes are mildly  dry.  No posterior exudates or erythema.  His mucous membranes are pale  in color.  NECK:  Supple.  No adenopathy, no thyromegaly, no bruit, no JVD.  LUNGS:  Clear to auscultation bilaterally.  HEART:  S1, S2, with no murmurs, rubs, or gallops.  ABDOMEN:  Mild obese, positive bowel sounds.  Soft, nontender,  nondistended.  No hepatosplenomegaly,  no masses palpated.  RECTAL/GU:  Deferred.  EXTREMITIES:  Pedal pulse on the right 2+, pedal pulse on the left 1+.  No pretibial edema and no pedal edema.  NEUROLOGIC:  The patient is alert and oriented x3.  Cranial nerves II-  XII intact.  Strength is 5/5 throughout.  Sensation is intact.  No  nystagmus.   ADMISSION LABORATORY DATA:  Chest x-ray pending.   EKG reveals normal sinus rhythm with a questionable incomplete right  bundle branch block,  heart rate 82 beats per minute.   WBC 6.4, hemoglobin 7.0, MCV 70.3, platelets 551.  Sodium 131, potassium  3.4, chloride 98, CO2 25, glucose 197, BUN 10, creatinine 1.02, calcium  9.0, total protein 7.4, ALT 13, AST 24, albumin 3.8.  PT 13.1, INR  1.0,  PTT 26.   ASSESSMENT:  1. Profound microcytic anemia.  The etiology is unclear at this time.      More than likely, the patient's anemia is from a gastrointestinal      source.  Of note, his stool was heme negative per Ms. Steadman's      exam in the office at [facility] today.  The patient is      currently hemodynamically stable.  His hemoglobin is 7 and his MCV      is 70.3.  2. Mild thrombocytosis.  The patient's platelet count is 551. More      than likely, the thrombocytosis is secondary to anemia.  3. Hypokalemia and hyponatremia.  The patient's serum potassium is 3.4      and his serum sodium is 131.   PLAN:  1. The patient will be admitted for further evaluation and management.  2. Will type and cross 4 units of packed red blood cells and transfuse      2 tonight.  3. Will continue to monitor his hemoglobin and hematocrit.  We will      also order iron studies.  4. Consult GI (gastroenterologist, Dr. Randa Evens, has already been      notified).  5. Will guaiac stools x3.  6. Replete potassium and sodium via normal saline with potassium      chloride added.  7. Prophylactic Protonix.  8. Will add bilateral lower extremity compression devices for DVT       prophylaxis.      Elliot Cousin, M.D.  Electronically Signed  DF/MEDQ  D:  03/16/2007  T:  03/16/2007  Job:  [id number]   cc:   Alfredia Client, MD  Fax: [phone]

## 2010-12-18 NOTE — Consult Note (Signed)
NAME:  Paul [name], Paul [name] NO.:  [id number]   MEDICAL RECORD NO.:  [id number]          PATIENT TYPE:  INP   LOCATION:  1406                         FACILITY:  [facility]   PHYSICIAN:  Graylin Shiver, M.D.   DATE OF BIRTH:  [birth year]   DATE OF CONSULTATION:  DATE OF DISCHARGE:                                 CONSULTATION   We were asked to see Paul [name] today in consultation for a possible GI  bleed and anemia, by Dr. Elliot Cousin.   HPI:  This is a 70 year old gentleman with a history of hypertension,  hyperlipidemia and kidney stones who began feeling weak and dizzy  approximately 2 weeks ago.  He was found in his primary care physician's  office to have a hemoglobin of 6.4 with an MCV value of 71.5.  He  reports positive acid indigestion and occasional episodes of dysphagia.  He had a bad episode of both acid indigestion and dysphagia this past  Sunday at a family reunion.  He also had an episode of maroon stools on  Sunday.  However, he reports no previous history of maroon stools or  melena.  No hematemesis.  Reports that he does not use NSAID'S but  occasionally takes Tylenol for pain.  He does use an 81-mg aspirin per  day as prescribed by his doctor.  He has never had a colonoscopy,  endoscopy or sedation that he knows of.   PAST MEDICAL HISTORY:  Significant for hypertension, hyperlipidemia and  kidney stones.  He reports that he has had no surgeries.  No previous  history of anemia.   CURRENT MEDICATIONS:  Vytorin, amlodipine and 81-mg aspirin and a  multivitamin.   ALLERGIES:  HE HAS NO KNOWN DRUG ALLERGIES.   REVIEW OF SYSTEMS:  Negative for recent fever, shortness of breath or  chest pain.   SOCIAL HISTORY:  Social history is positive for tobacco x25 years.  He  quit 25 years ago. It is also positive for alcohol x25 years and again  the patient quit drinking 25 years ago.  Negative for drugs.   FAMILY HISTORY:  Negative for colon cancer and ulcer  disease.   PHYSICAL EXAM:  GENERAL:  On physical exam he is alert and oriented in  no apparent distress.  VITAL SIGNS:  His temperature is 98.4.  His pulse is 77.  His  respirations are 18.  His blood pressure is 136/76.  CARDIOVASCULAR:  His cardiovascular system has a regular rate and  rhythm.  LUNGS:  His lungs are clear to auscultation bilaterally.  ABDOMEN:  His abdomen is obese, mildly firm.  Has positive bowel sounds.  He is nontender.  RECTAL:  On rectal exam, he has both internal and external hemorrhoids.  No masses found, and he is guaiac negative.   LABS:  Hemoglobin 8.9, hematocrit 27.5.  Yesterday, he had a hemoglobin  of 7, hematocrit 21.8, white count 6.4 and platelets 551,000.  His BUN  is 10, creatinine is 1.02, potassium is 3.4.  Sodium is 131.  LFT's are  within normal limits.   ASSESSMENT:  Dr.  Wandalee Ferdinand has seen and examined the patient and  believes that this is a 70 year old male who has anemia.  It is possibly  an upper or a lower GI bleed.  We will proceed with endoscopy and  colonoscopy  tomorrow at noon.  Risks of sedation, perforation and bleeding have been  explained to the patient and his wife.  Both indicate comprehension and  acceptance and wish to proceed with the colonoscopy and endoscopy.   Thanks very much for this consultation.      Stephani Police, PA    ______________________________  Graylin Shiver, M.D.    MLY/MEDQ  D:  03/17/2007  T:  03/18/2007  Job:  [id number]   cc:   Alfredia Client, MD  Fax: [phone]

## 2010-12-18 NOTE — Op Note (Signed)
NAME:  Paul [name], Paul [name] NO.:  [id number]   MEDICAL RECORD NO.:  [id number]          PATIENT TYPE:  INP   LOCATION:  1406                         FACILITY:  [facility]   PHYSICIAN:  Graylin Shiver, M.D.   DATE OF BIRTH:  12/04/1940   DATE OF PROCEDURE:  03/18/2007  DATE OF DISCHARGE:                               OPERATIVE REPORT   PROCEDURE PERFORMED:  Colonoscopy.   INDICATIONS FOR PROCEDURE:  Anemia.  Informed consent was obtained after  explanation of the risks of bleeding, infection and perforation.   PREMEDICATION:  The procedure was done after an EGD with an additional  50 mcg of fentanyl and 6 mg of Versed.   PROCEDURE:  With the patient in the left lateral decubitus position, a  rectal exam was performed.  No masses were felt. The Pentax colonoscope  was inserted into the rectum and advanced around the colon to the cecum.  Cecal landmarks were identified.  The cecum and ascending colon were  normal.  The transverse colon was normal.  The descending colon, sigmoid  and rectum were normal.  The scope was retroflexed.  There was a small  hemorrhoid. He tolerated the procedure well without complications.   IMPRESSION:  Normal colonoscopy with a small hemorrhoid   There is nothing on the colonoscopy or the EGD to explain the patient's  anemia. We will schedule a CT enterography.           ______________________________  Graylin Shiver, M.D.     SFG/MEDQ  D:  03/18/2007  T:  03/19/2007  Job:  [id number]

## 2010-12-18 NOTE — Discharge Summary (Signed)
NAME:  Paul [name], Paul [name] NO.:  [id number]   MEDICAL RECORD NO.:  [id number]          PATIENT TYPE:  INP   LOCATION:  1439                         FACILITY:  [facility]   PHYSICIAN:  Lucrezia Starch. Earlene Plater, M.D.  DATE OF BIRTH:  September 11, 1940   DATE OF ADMISSION:  10/19/2008  DATE OF DISCHARGE:  10/20/2008                               DISCHARGE SUMMARY   ADMISSION DIAGNOSIS:  Adenocarcinoma of the prostate.   DISCHARGE DIAGNOSIS:  Adenocarcinoma of the prostate.   PROCEDURES:  1. Robotic assisted laparoscopic radical prostatectomy (bilateral      nerve sparring).  2. Bilateral pelvic lymphadenectomy.   HISTORY AND PHYSICAL:  For full details, please see admission history  and physical.  Briefly, Paul [name] is a 70 year old gentleman who was  found to have clinically low-grade adenocarcinoma of the prostate.  After careful consideration regarding management options for treatment,  he elected to proceed with surgical therapy and a robotic assisted  laparoscopic radical prostatectomy.   HOSPITAL COURSE:  On October 19, 2008, he was taken to the operating room  and underwent the above named procedure in which he tolerated well and  without complications.  Postoperatively, he was able to be transferred  to a regular hospital room following recovery from anesthesia.  He was  able to begin ambulation that evening.  His postoperative labs showed a  hemoglobin of 7.6.  Dr. Earlene Plater and Dr. Jethro Bolus (Hematology) were  consulted about his low hemoglobin.  It was found that he has a  chronically low hemoglobin.  It was decided that this was probably  delusional and he was given two units of packed red blood cells, and  then his hemoglobin was rechecked.  At that time, his hemoglobin had  increased to 9.7.  On the morning of postoperative day #1, his  hemoglobin was also found to be stable at 9.7.  He maintained excellent  urine output with minimal output from his pelvic drain, therefore, the  pelvic drain was removed.  He was placed on clear, liquid diet and  continued to ambulate.  He was reevaluated on the afternoon of  postoperative day #1.  His urine output, blood pressure and pulse all  remained stable.  He was also able to tolerate his clear liquid diet.  Therefore, he was felt to be stable for discharge as he had met all  discharge criteria.   DISPOSITION:  Home.   DISCHARGE MEDICATIONS:  He was instructed to resume his regular home  medications including lisinopril, Vytorin, iron supplements and vitamin  C.  In addition, he was provided a prescription for Vicodin to take as  needed for pain, and told to use Colace as a stool softener.  He was  also given a prescription for Cipro to use for seven days.   DISCHARGE INSTRUCTIONS:  He was instructed to be ambulatory, but  specifically told to refrain from any heavy lifting, strenuous activity  or driving.  He was instructed on routine Foley catheter care and told  to gradually advance his diet over the course of the next few days.  FOLLOWUP:  He will follow up in one week with Dr. Earlene Plater for removal of  Foley catheter and skin staples.  He will follow up in 10 days with Dr.  Gaylyn Rong (Hematology) for continued workup of his clinically low hemoglobin.      Delia Chimes, NP      Lucrezia Starch. Earlene Plater, M.D.  Electronically Signed    MA/MEDQ  D:  10/21/2008  T:  10/21/2008  Job:  [id number]

## 2010-12-18 NOTE — Op Note (Signed)
NAME:  Paul [name], Paul [name] NO.:  [id number]   MEDICAL RECORD NO.:  [id number]          PATIENT TYPE:  INP   LOCATION:  0003                         FACILITY:  [facility]   PHYSICIAN:  Lucrezia Starch. Earlene Plater, M.D.  DATE OF BIRTH:  May 30, 1941   DATE OF PROCEDURE:  10/19/2008  DATE OF DISCHARGE:                               OPERATIVE REPORT   DIAGNOSIS:  Adenocarcinoma of the prostate.   OPERATIVE PROCEDURE:  Robotic-assisted laparoscopic radical  prostatectomy and bilateral pelvic lymphadenectomy.   SURGEON:  Gaynelle Arabian, M.D.   ASSISTANT:  Delia Chimes, N.P.C.   ANESTHESIA:  General endotracheal.   BLOOD LOSS:  200 mL.   TUBES:  Large flat Blake drain and 20-French coude catheter.   COMPLICATIONS:  None.   INDICATIONS FOR PROCEDURE:  Mr. Alter is a very nice 70 year old white  male who was found have an elevated PSA of 5.8.  He subsequently  underwent transrectal ultrasound and biopsy of the prostate which  revealed multiple foci of prostate cancer throughout the prostate.  It  was Gleason 3 + 3 = 6 on the right side of the prostate.  However, on  the left at the left lateral apex and left apex, it was a Gleason score  7 which was 3 + 4.  He has undergone a metastatic workup which was  negative.  He has a chronic anemia and has been worked up by hematology  and GI, and after having cleared and after understanding risks, benefits  and alternatives, he has elected to proceed with the above procedure.   PROCEDURE IN DETAIL:  The patient was placed in supine position.  After  proper general endotracheal anesthesia, he was placed in the exaggerated  lithotomy position and prepped and draped with Betadine in sterile  fashion.  A 24-French 30-mL balloon Foley was placed into the bladder,  and the bladder was drained of clear urine.  A periumbilical port was  placed with a small incision under direct vision at the proper measured  angle, and a 12-mm cannula was placed for  the camera port, and the  abdomen was insufflated with carbon dioxide.  Inspection of the abdomen  revealed no significant lesions throughout the abdomen.  Under direct  vision, both a right and left robotic arm port were placed and noted to  be in good position with 8-mm robotic cannulas.  A fourth arm port was  placed in the left lateral portion of the abdomen, and 2 working ports  were placed in the right abdomen and 5-mm and 12-mm working ports.  The  bladder was then insufflated with 250 mL of normal saline, and the  dissection was begun.  Both the median and medial umbilical ligaments  were taken down anteriorly and the space of Retzius was entered, and the  pelvic fascia was taken down bilaterally, and the prostate was defatted.  Dissection was carried down after the endopelvic fascia had been taken  down.  The puboprostatic ligaments were maintained intact, and the  dorsal vein complex was identified.  Utilizing the endovascular staple,  the dorsal vein complex  was clamped and cut, and the prostate was  released.  The bladder was then taken off of the prostate carefully, the  anterior bladder and then the posterior bladder.  The seminal vesicles  and ampulla vas deferens were identified.  The seminal vesicles were  dissected out in their entirety, and the ampulla vas deferens were  incised revealing Denonvilliers fascia.  Bilateral nerve spare was then  performed with sharp dissection utilizing a veil technique, and the  pedicles were taken both on the right side in packets with Hem-o-lok  clips, and dissection was carried down on Denonvilliers fascia to the  apex of the prostate.  The apex was then approached, and the  neurovascular bundles and then taken down laterally and appeared to be a  good margin.  The apex was then incised and the specimen placed in the  right lower quadrant.  Bilateral pelvic lymphadenectomy was then  performed.  Both the obturator, hypogastric and  external iliac nodes  were taken.  There were clipped proximally and distally and submitted to  pathology as right and left sides, and the obturator nerve was  identified and protected.  Next, the bladder urethral anastomosis was  approached.  An amp of indigo carmine was given IV, and the ureteral  orifices appeared to be well away from the bladder neck incision.  A  holding stitch was placed in the 6 o'clock position with 2-0 Vicryl  suture and placed the bladder neck in proximity to the urethra.  The  anastomosis was then completed with running 3-0 Monocryl suture, both  dyed and undyed suture tied posteriorly under the bladder.  Following  anastomosis, a 20-French coude catheter was passed in the bladder.  The  balloon was inflated with 15 mL of sterile water, and the bladder was  noted to irrigate a clear blue dye without extravasation.  The specimen  was then grasped with an EndoCatch bag and pulled through the  periumbilical port.  The right 12-mm working port was closed under  direct vision with a suture passer and a 2-0 Vicryl suture.  Each port  was visualized and removed, and there was good hemostasis present.  The  specimen was moved to the periumbilical incision and submitted to  pathology.  The fascia of the paraumbilical incision was closed under  direct vision with running 2-0 Vicryl suture.  All wounds were  irrigated, injected with 0.25% Marcaine and closed with skin staples.  Again, his bladder was noted to irrigate clear.  Of note, a large flat  Blake drain was then placed through the fourth arm port and was placed  in good position in the pelvis and was sutured to the skin with nylon.  All wounds were dressed sterilely.  The patient tolerated the procedure  well.  No complications.  He was taken to recovery room stable.      Ronald L. Earlene Plater, M.D.  Electronically Signed     RLD/MEDQ  D:  10/19/2008  T:  10/19/2008  Job:  [id number]

## 2010-12-18 NOTE — H&P (Signed)
NAME:  Paul [name], SASSONE NO.:  [id number]   MEDICAL RECORD NO.:  [id number]          PATIENT TYPE:  INP   LOCATION:  1439                         [facility]ACILITY:  [facility]   PHYSICIAN:  Lucrezia Starch. Earlene Plater, M.D.  DATE OF BIRTH:  06/23/1941   DATE OF ADMISSION:  10/19/2008  DATE OF DISCHARGE:  10/20/2008                              HISTORY & PHYSICAL   HISTORY OF PRESENT ILLNESS:  Mr. Luhn is a nice 70 year old white male  who was found on November 24, 2007, to have a PSA of 3.9 and his biopsy  revealed a Gleason score 6/3+3 and a Gleason score 7/3+4 plus  adenocarcinoma.  His metastatic workup was negative.  His prostate size  was 29.5 mL.  He has considered all options, delayed the situation for a  while, but has elected to be admitted to undergo a robotic  prostatectomy.  His metastatic workup was negative.   PAST MEDICAL HISTORY:  NO KNOWN ALLERGIES.   MEDICATIONS:  Benicar/hydrochlorothiazide combination, Vytorin, iron and  vitamin C.   PAST MEDICAL HISTORY:  He does have chronic anemia and he has been  worked up for that and his iron deficiency.   FAMILY HISTORY:  Nonsignificant.   SOCIAL HISTORY:  Nonsignificant.   REVIEW OF SYSTEMS:  He has no shortness of breath, dyspnea on exertion,  chest pain or GI complaints.   PHYSICAL EXAMINATION:  VITAL SIGNS:  He is afebrile.  Vital signs  stable.  GENERAL:  Well-nourished, well-developed, well-groomed in no acute  stress, oriented x3.  HEAD/EARS/EYES/NOSE/THROAT:  Normal.  NECK:  Without masses, thyromegaly or bruits.  CHEST:  Clear posteriorly without rales or rhonchi.  HEART:  Normal sinus rhythm without murmurs or gallops.  ABDOMEN:  Soft, mildly obese without masses or organomegaly.  GU:  Penis and testicles without lesion.  Rectal vault is empty.  Prostate is 3+ and smooth.  EXTREMITIES:  Normal.  NEURO:  Intact.  SKIN:  Normal.   IMPRESSION:  Adenocarcinoma of the prostate.   PLAN:  Robotic prostatectomy  with bilateral pelvic lymphadenectomy.      Ronald L. Earlene Plater, M.D.  Electronically Signed     RLD/MEDQ  D:  11/07/2008  T:  11/07/2008  Job:  [id number]

## 2011-05-20 LAB — HEMOGLOBIN AND HEMATOCRIT, BLOOD
HCT: 29.2 — ABNORMAL LOW
Hemoglobin: 8.9 — ABNORMAL LOW

## 2011-05-20 LAB — BASIC METABOLIC PANEL
GFR calc Af Amer: >60
GFR calc non Af Amer: >60
Glucose, Bld: 111 — ABNORMAL HIGH
Potassium: 3.9
Sodium: 138

## 2011-05-20 LAB — DIFFERENTIAL
Basophils Relative: 2 — ABNORMAL HIGH
Lymphs Abs: 1.8
Monocytes Relative: 11
Neutro Abs: 3.8
Neutrophils Relative %: 59

## 2011-05-20 LAB — CBC
HCT: 21.8 — ABNORMAL LOW
HCT: 34.1 — ABNORMAL LOW
HCT: 35.6 — ABNORMAL LOW
Hemoglobin: 11.3 — ABNORMAL LOW
Hemoglobin: 11.6 — ABNORMAL LOW
Hemoglobin: 7 — CL
MCHC: 31.9
MCHC: 33.1
MCV: 70.3 — ABNORMAL LOW
MCV: 79.5
RBC: 4.44
RDW: 18.6 — ABNORMAL HIGH
RDW: 22.4 — ABNORMAL HIGH
RDW: 22.9 — ABNORMAL HIGH

## 2011-05-20 LAB — CROSSMATCH: Antibody Screen: NEGATIVE

## 2011-05-20 LAB — COMPREHENSIVE METABOLIC PANEL
BUN: 10
Calcium: 9
GFR calc non Af Amer: >60
Glucose, Bld: 197 — ABNORMAL HIGH
Total Protein: 7.4

## 2011-05-20 LAB — FERRITIN: Ferritin: <1 — ABNORMAL LOW (ref 22–322)

## 2011-05-20 LAB — VITAMIN B12: Vitamin B-12: 298 (ref 211–911)

## 2011-05-20 LAB — ABO/RH: ABO/RH(D): O POS

## 2012-04-07 ENCOUNTER — Other Ambulatory Visit: Payer: Self-pay | Admitting: Oncology

## 2012-04-07 DIAGNOSIS — D649 Anemia, unspecified: Secondary | ICD-10-CM

## 2012-04-07 DIAGNOSIS — D539 Nutritional anemia, unspecified: Secondary | ICD-10-CM

## 2012-04-09 ENCOUNTER — Telehealth: Payer: Self-pay | Admitting: Oncology

## 2012-04-09 NOTE — Telephone Encounter (Signed)
S/w the pt's wife and she is aware of the appts on 04/17/2012

## 2012-04-17 ENCOUNTER — Ambulatory Visit (HOSPITAL_BASED_OUTPATIENT_CLINIC_OR_DEPARTMENT_OTHER): Payer: Medicare Other

## 2012-04-17 ENCOUNTER — Ambulatory Visit (HOSPITAL_BASED_OUTPATIENT_CLINIC_OR_DEPARTMENT_OTHER): Payer: Medicare Other | Admitting: Oncology

## 2012-04-17 ENCOUNTER — Other Ambulatory Visit (HOSPITAL_BASED_OUTPATIENT_CLINIC_OR_DEPARTMENT_OTHER): Payer: Medicare Other | Admitting: Lab

## 2012-04-17 VITALS — BP 125/72 | HR 86 | Temp 98.4°F | Resp 20 | Ht 68.0 in | Wt 200.0 lb

## 2012-04-17 DIAGNOSIS — D649 Anemia, unspecified: Secondary | ICD-10-CM

## 2012-04-17 DIAGNOSIS — D509 Iron deficiency anemia, unspecified: Secondary | ICD-10-CM

## 2012-04-17 DIAGNOSIS — D539 Nutritional anemia, unspecified: Secondary | ICD-10-CM

## 2012-04-17 LAB — CBC WITH DIFFERENTIAL/PLATELET
EOS%: 0.8 % (ref 0.0–7.0)
LYMPH%: 30.1 % (ref 14.0–49.0)
MCH: 28.6 pg (ref 27.2–33.4)
MCHC: 32.5 g/dL (ref 32.0–36.0)
MCV: 88.1 fL (ref 79.3–98.0)
MONO%: 10.1 % (ref 0.0–14.0)
Platelets: 387 10*3/uL (ref 140–400)
RBC: 3.83 10*6/uL — ABNORMAL LOW (ref 4.20–5.82)
RDW: 24.1 % — ABNORMAL HIGH (ref 11.0–14.6)

## 2012-04-17 LAB — COMPREHENSIVE METABOLIC PANEL (CC13)
ALT: 20 U/L (ref 0–55)
AST: 21 U/L (ref 5–34)
Albumin: 4 g/dL (ref 3.5–5.0)
BUN: 15 mg/dL (ref 7.0–26.0)
CO2: 24 mEq/L (ref 22–29)
Calcium: 9.9 mg/dL (ref 8.4–10.4)
Chloride: 104 mEq/L (ref 98–107)
Creatinine: 1 mg/dL (ref 0.7–1.3)
Potassium: 4.5 mEq/L (ref 3.5–5.1)

## 2012-04-17 LAB — IRON AND TIBC
TIBC: 352 ug/dL (ref 215–435)
UIBC: 145 ug/dL (ref 125–400)

## 2012-04-17 LAB — MORPHOLOGY

## 2012-04-17 LAB — CHCC SMEAR

## 2012-04-17 LAB — LACTATE DEHYDROGENASE (CC13): LDH: 159 U/L (ref 125–220)

## 2012-04-17 MED ORDER — SODIUM CHLORIDE 0.9 % IV SOLN
1020.0000 mg | Freq: Once | INTRAVENOUS | Status: AC
  Administered 2012-04-17: 1020 mg via INTRAVENOUS
  Filled 2012-04-17: qty 34

## 2012-04-17 NOTE — Patient Instructions (Addendum)
1.  Diagnosis:  Anemia.  History of iron deficiency with improvement with IV iron and resolution of prostate biopsy bleeding. 2.  Work up:  Iron panel to determine if you're iron deficient. 3.  Treatment:  Empiric IV iron within 1 week.  4.  Follow up:  Lab appointment every 2 months.  Return visit in about 6 months.  If no improvement of hemoglobin despite IV infusion, then further work up may be considered.

## 2012-04-18 ENCOUNTER — Encounter: Payer: Self-pay | Admitting: Oncology

## 2012-04-18 NOTE — Progress Notes (Signed)
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   PAST THERAPY: IV iron  CURRENT THERAPY: oral iron.   INTERVAL HISTORY: Paul [name] 71 y.o. male returns for follow up.  I evaluated him in 2010 for anemia.  Extensive work up for iron deficiency anemia was negative including EGD/capsule endo/colonoscopy.  Even bone marrow biopsy was performed due to the refractory nature of his anemia which turned out to be normal.  He received IV iron with normalization of his anemia.  He was lost to follow up.  He was doing well until recently when his PCP found him anemic again.  He has recently resumed oral iron for the last 3 weeks.  He was kindly referred back to the [facility] for evaluation.    Paul [name] presented today with his wife.  There has been no additional medical history since he last saw me.  His urologist seems him about twice a year for his history of prostate cancer.  His wife thinks that his last PSA was not detectable.  Despite anemia, he feels well.  He denies all visible bleeding.  Patient denies fever, anorexia, weight loss, fatigue, headache, visual changes, confusion, drenching night sweats, palpable lymph node swelling, mucositis, odynophagia, dysphagia, nausea vomiting, jaundice, chest pain, palpitation, shortness of breath, dyspnea on exertion, productive cough, gum bleeding, epistaxis, hematemesis, hemoptysis, abdominal pain, abdominal swelling, early satiety, melena, hematochezia, hematuria, skin rash, spontaneous bleeding, joint swelling, joint pain, heat or cold intolerance, bowel bladder incontinence, back pain, focal motor weakness, paresthesia, depression, suicidal or homicidal ideation, feeling hopelessness.   Past Medical History  Diagnosis Date  . Hypertension   . Hyperlipidemia   . Kidney stones   . Adenocarcinoma  of prostate 2010    Gleason 7; s/p prostatectomy; involving both lobes; No extraprostatic involvement; neg margin;  pT2c, pN0, M0   . Iron deficiency anemia     presumped malabsorption; neg EGD/ capsule endos/colosopcy    Past Surgical History  Procedure Date  . Robtic assisted laparoscopic     radical prostatectomy and bilateral pelvic lymphadenectomy    Current Outpatient Prescriptions  Medication Sig Dispense Refill  . AMLODIPINE BESYLATE PO Take 5 mg by mouth daily.      Marland Kitchen aspirin 81 MG tablet Take 81 mg by mouth daily.      Marland Kitchen atorvastatin (LIPITOR) 40 MG tablet Take 40 mg by mouth daily.      . ergocalciferol (VITAMIN D2) 50000 UNITS capsule Take 50,000 Units by mouth once a week.      . fish oil-omega-3 fatty acids 1000 MG capsule Take 2 g by mouth daily.      . Iron 66 MG TABS Take 1 tablet by mouth daily.      Marland Kitchen lisinopril (PRINIVIL,ZESTRIL) 20 MG tablet Take 20 mg by mouth daily.        ALLERGIES:  is allergic to sulfonamide derivatives.  REVIEW OF SYSTEMS:  The rest of the 14-point review of system was negative.   Filed Vitals:   04/17/12 1342  BP: 125/72  Pulse: 86  Temp: 98.4 F (36.9 C)  Resp: 20   Wt Readings from Last 3 Encounters:  04/17/12 200 lb (90.719 kg)  04/05/10 199 lb (90.266 kg)   ECOG Performance status: 0  PHYSICAL EXAMINATION:   General:  well-nourished man in no acute distress.  Eyes:  no scleral icterus.  ENT:  There were no oropharyngeal lesions.  Neck was without thyromegaly.  Lymphatics:  Negative cervical, supraclavicular or axillary adenopathy.  Respiratory: lungs were clear bilaterally without wheezing or crackles.  Cardiovascular:  Regular rate and rhythm, S1/S2, without murmur, rub or gallop.  There was no pedal edema.  GI:  abdomen was soft, flat, nontender, nondistended, without organomegaly.  Muscoloskeletal:  no spinal tenderness of palpation of vertebral spine.  Skin exam was without echymosis, petichae.  Neuro exam was nonfocal.   Patient was able to get on and off exam table without assistance.  Gait was normal.  Patient was alerted and oriented.  Attention was good.   Language was appropriate.  Mood was normal without depression.  Speech was not pressured.  Thought content was not tangential.       LABORATORY/RADIOLOGY DATA:  Lab Results  Component Value Date   WBC 6.8 04/17/2012   HGB 10.9* 04/17/2012   HCT 33.7* 04/17/2012   PLT 387 04/17/2012   GLUCOSE 107* 04/17/2012   ALKPHOS 109 04/17/2012   ALT 20 04/17/2012   AST 21 04/17/2012   NA 138 04/17/2012   K 4.5 04/17/2012   CL 104 04/17/2012   CREATININE 1.0 04/17/2012   BUN 15.0 04/17/2012   CO2 24 04/17/2012   INR 1.0 10/17/2008   Iron 207; TIBC 352; % sat 59; Ferritin 21.  ASSESSMENT AND PLAN:   1.  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  Lab today did not show evidence of hemolysis.  He has been on oral iron with no significant improvement of his Hgb yet. He had great improvement of his anemia in the past with IV iron.  I again recommended IV Kathrine Cords which he agreed to receive today.  He expressed informed understanding of slight risk of myalgia/arthralgia, infusion reaction.  If there is no significant response with IV iron, I may consider further work up in the future.   - Follow up: [facility] in 2, 4 months.  Return visit in 6 months.      The length of time of the face-to-face encounter was 25   minutes. More than 50% of time was spent counseling and coordination of care.

## 2012-06-12 ENCOUNTER — Other Ambulatory Visit (HOSPITAL_BASED_OUTPATIENT_CLINIC_OR_DEPARTMENT_OTHER): Payer: Medicare Other

## 2012-06-12 ENCOUNTER — Telehealth: Payer: Self-pay | Admitting: *Deleted

## 2012-06-12 ENCOUNTER — Telehealth: Payer: Self-pay | Admitting: Oncology

## 2012-06-12 DIAGNOSIS — D649 Anemia, unspecified: Secondary | ICD-10-CM

## 2012-06-12 LAB — CBC WITH DIFFERENTIAL/PLATELET
BASO%: 0.7 % (ref 0.0–2.0)
HCT: 42.8 % (ref 38.4–49.9)
MCHC: 33.9 g/dL (ref 32.0–36.0)
MONO#: 0.7 10*3/uL (ref 0.1–0.9)
NEUT%: 47.2 % (ref 39.0–75.0)
RBC: 4.49 10*6/uL (ref 4.20–5.82)
WBC: 6.6 10*3/uL (ref 4.0–10.3)
lymph#: 2.6 10*3/uL (ref 0.9–3.3)

## 2012-06-12 NOTE — Telephone Encounter (Signed)
Message copied by Wende Mott on Fri Jun 12, 2012 11:16 AM ------      Message from: Jethro Bolus T      Created: Fri Jun 12, 2012 10:37 AM       Please call pt.  His Hgb improved with IV iron.  He can cancel lab only appointment in Jan 2013, but keep lab/RV with Belenda Cruise in March 2014.  Continue oral iron as tolerated.  Thanks.

## 2012-06-12 NOTE — Telephone Encounter (Signed)
Per pof cancelled lab only in [facility]

## 2012-06-12 NOTE — Telephone Encounter (Signed)
Called pt and spoke w/ wife.  Informed her of anemia improved w/ IV Iron,  Hgb wnl today.  Instructed per Dr. Gaylyn Rong pt to keep appt in March but we will cancel the lab in January.  She verbalized understanding.

## 2012-08-07 ENCOUNTER — Other Ambulatory Visit: Payer: Medicare Other | Admitting: Lab

## 2012-09-24 ENCOUNTER — Encounter: Payer: Self-pay | Admitting: *Deleted

## 2012-09-24 NOTE — Progress Notes (Signed)
Lab received from Dr. Caryn Bee at [facility].  Forwarded to Dr. Gaylyn Rong for review.  Hgb 12.0.

## 2012-10-16 ENCOUNTER — Encounter: Payer: Self-pay | Admitting: Oncology

## 2012-10-16 ENCOUNTER — Other Ambulatory Visit (HOSPITAL_BASED_OUTPATIENT_CLINIC_OR_DEPARTMENT_OTHER): Payer: Medicare Other | Admitting: Lab

## 2012-10-16 ENCOUNTER — Ambulatory Visit (HOSPITAL_BASED_OUTPATIENT_CLINIC_OR_DEPARTMENT_OTHER): Payer: Medicare Other | Admitting: Oncology

## 2012-10-16 ENCOUNTER — Telehealth: Payer: Self-pay | Admitting: Oncology

## 2012-10-16 VITALS — BP 141/72 | HR 79 | Temp 97.5°F | Resp 18 | Ht 68.0 in | Wt 205.7 lb

## 2012-10-16 DIAGNOSIS — D509 Iron deficiency anemia, unspecified: Secondary | ICD-10-CM | POA: Insufficient documentation

## 2012-10-16 DIAGNOSIS — D649 Anemia, unspecified: Secondary | ICD-10-CM

## 2012-10-16 HISTORY — DX: Iron deficiency anemia, unspecified: D50.9

## 2012-10-16 LAB — COMPREHENSIVE METABOLIC PANEL (CC13)
BUN: 18 mg/dL (ref 7.0–26.0)
CO2: 26 mEq/L (ref 22–29)
Creatinine: 1 mg/dL (ref 0.7–1.3)
Glucose: 105 mg/dl — ABNORMAL HIGH (ref 70–99)
Total Bilirubin: 0.36 mg/dL (ref 0.20–1.20)
Total Protein: 7.3 g/dL (ref 6.4–8.3)

## 2012-10-16 LAB — FERRITIN: Ferritin: 33 ng/mL (ref 22–322)

## 2012-10-16 LAB — CBC WITH DIFFERENTIAL/PLATELET
Basophils Absolute: 0 10*3/uL (ref 0.0–0.1)
Eosinophils Absolute: 0.2 10*3/uL (ref 0.0–0.5)
HCT: 38.2 % — ABNORMAL LOW (ref 38.4–49.9)
HGB: 12.4 g/dL — ABNORMAL LOW (ref 13.0–17.1)
MONO#: 0.8 10*3/uL (ref 0.1–0.9)
NEUT#: 4.3 10*3/uL (ref 1.5–6.5)
NEUT%: 53.1 % (ref 39.0–75.0)
WBC: 8.1 10*3/uL (ref 4.0–10.3)
lymph#: 2.7 10*3/uL (ref 0.9–3.3)

## 2012-10-16 LAB — IRON AND TIBC
%SAT: 95 % — ABNORMAL HIGH (ref 20–55)
Iron: 322 ug/dL — ABNORMAL HIGH (ref 42–165)
TIBC: 340 ug/dL (ref 215–435)

## 2012-10-16 NOTE — Telephone Encounter (Signed)
gv and printed appt schedule for may, july, and sept

## 2012-10-16 NOTE — Progress Notes (Signed)
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   PAST THERAPY: IV iron  CURRENT THERAPY: oral iron.   INTERVAL HISTORY: Paul [name] 72 y.o. male returns for follow up. He has had extensive work up for iron deficiency anemia was negative including EGD/capsule endo/colonoscopy.  Even bone marrow biopsy was performed due to the refractory nature of his anemia which turned out to be normal.  He received IV iron with normalization of his anemia.  Paul [name] presented today with his wife.  There has been no additional medical history since he last saw me.  His urologist sees him annually for his history of prostate cancer.  His wife thinks that his last PSA was not detectable.  He denies all visible bleeding.  Patient denies fever, anorexia, weight loss, fatigue, headache, visual changes, confusion, drenching night sweats, palpable lymph node swelling, mucositis, odynophagia, dysphagia, nausea vomiting, jaundice, chest pain, palpitation, shortness of breath, dyspnea on exertion, productive cough, gum bleeding, epistaxis, hematemesis, hemoptysis, abdominal pain, abdominal swelling, early satiety, melena, hematochezia, hematuria, skin rash, spontaneous bleeding, joint swelling, joint pain, heat or cold intolerance, bowel bladder incontinence, back pain, focal motor weakness, paresthesia, depression, suicidal or homicidal ideation, feeling hopelessness.   Past Medical History  Diagnosis Date  . Hypertension   . Hyperlipidemia   . Kidney stones   . 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 Iron deficiency anemia     presumped malabsorption; neg EGD/ capsule endos/colosopcy  . Iron deficiency anemia, unspecified 10/16/2012    Past Surgical  History  Procedure Laterality Date  . Robtic assisted laparoscopic      radical prostatectomy and bilateral pelvic lymphadenectomy    Current Outpatient Prescriptions  Medication Sig Dispense Refill  . Cholecalciferol (VITAMIN D3) 2000 UNITS TABS Take 2,000 Units by mouth daily.      Marland Kitchen AMLODIPINE BESYLATE PO Take 5 mg by mouth daily.      Marland Kitchen aspirin 81 MG tablet Take 81 mg by mouth daily.      Marland Kitchen atorvastatin (LIPITOR) 40 MG tablet Take 40 mg by mouth daily.      . fish oil-omega-3 fatty acids 1000 MG capsule Take 2 g by mouth daily.      . Iron 66 MG TABS Take 1 tablet by mouth daily.      Marland Kitchen lisinopril (PRINIVIL,ZESTRIL) 20 MG tablet Take 20 mg by mouth daily.       No current facility-administered medications for this visit.    ALLERGIES:  is allergic to sulfonamide derivatives.  REVIEW OF SYSTEMS:  The rest of the 14-point review of system was negative.   Filed Vitals:   10/16/12 1039  BP: 141/72  Pulse: 79  Temp: 97.5 F (36.4 C)  Resp: 18   Wt Readings from Last 3 Encounters:  10/16/12 205 lb 11.2 oz (93.305 kg)  04/17/12 200 lb (90.719 kg)  04/05/10 199 lb (90.266 kg)   ECOG Performance status: 0  PHYSICAL EXAMINATION:   General:  well-nourished man in no acute distress.  Eyes:  no scleral icterus.  ENT:  There were no oropharyngeal lesions.  Neck was without thyromegaly.  Lymphatics:  Negative cervical, supraclavicular or axillary adenopathy.  Respiratory: lungs were clear bilaterally without wheezing or crackles.  Cardiovascular:  Regular rate and rhythm, S1/S2, without murmur, rub or gallop.  There was no pedal edema.  GI:  abdomen was soft, flat, nontender, nondistended, without organomegaly.  Muscoloskeletal:  no spinal tenderness of palpation of vertebral spine.  Skin exam was without echymosis, petichae.  Neuro exam was nonfocal.  Patient was able to get on and off exam table without assistance.  Gait was normal.  Patient was alerted and oriented.  Attention was  good.   Language was appropriate.  Mood was normal without depression.  Speech was not pressured.  Thought content was not tangential.       LABORATORY/RADIOLOGY DATA:  Lab Results  Component Value Date   WBC 8.1 10/16/2012   HGB 12.4* 10/16/2012   HCT 38.2* 10/16/2012   PLT 410* 10/16/2012   GLUCOSE 105* 10/16/2012   ALKPHOS 121 10/16/2012   ALT 23 10/16/2012   AST 25 10/16/2012   NA 141 10/16/2012   K 4.1 10/16/2012   CL 106 10/16/2012   CREATININE 1.0 10/16/2012   BUN 18.0 10/16/2012   CO2 26 10/16/2012   INR 1.0 10/17/2008    ASSESSMENT AND PLAN:   1.  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  Lab today did not show evidence of hemolysis.  He received IV iron with normalization of his Hgb; Hgb is now drifting down. He remains or oral iron. I will await the results of his iron studies. He may need additional IV iron pending these results.   - Follow up: CBC [facility] at [facility] in 2, 4 months.  Return visit in 6 months.      The length of time of the face-to-face encounter was 15   minutes. More than 50% of time was spent counseling and coordination of care.

## 2012-12-11 ENCOUNTER — Other Ambulatory Visit (HOSPITAL_BASED_OUTPATIENT_CLINIC_OR_DEPARTMENT_OTHER): Payer: Medicare Other

## 2012-12-11 ENCOUNTER — Telehealth: Payer: Self-pay

## 2012-12-11 DIAGNOSIS — D509 Iron deficiency anemia, unspecified: Secondary | ICD-10-CM

## 2012-12-11 LAB — CBC WITH DIFFERENTIAL/PLATELET
BASO%: 0.7 % (ref 0.0–2.0)
Eosinophils Absolute: 0.2 10*3/uL (ref 0.0–0.5)
HCT: 41 % (ref 38.4–49.9)
LYMPH%: 40.9 % (ref 14.0–49.0)
MCHC: 32.2 g/dL (ref 32.0–36.0)
MCV: 95.3 fL (ref 79.3–98.0)
MONO#: 0.8 10*3/uL (ref 0.1–0.9)
MONO%: 11.9 % (ref 0.0–14.0)
NEUT%: 44.2 % (ref 39.0–75.0)
Platelets: 328 10*3/uL (ref 140–400)
RBC: 4.31 10*6/uL (ref 4.20–5.82)
WBC: 6.9 10*3/uL (ref 4.0–10.3)

## 2012-12-11 NOTE — Telephone Encounter (Signed)
Message copied by Kallie Locks on Fri Dec 11, 2012  5:58 PM ------      Message from: Myrtis Ser      Created: Fri Dec 11, 2012 10:58 AM       Please call pt. Hgb is normal today. Continue observation with labs as scheduled in July. ------

## 2013-01-12 ENCOUNTER — Telehealth: Payer: Self-pay | Admitting: *Deleted

## 2013-01-12 NOTE — Telephone Encounter (Signed)
sw pt informed him that 04/05/13 we are closed. gv appt d/t for 04/06/13 labs@10am  and ov @10 :30am. Pt is aware that i will mail a letter/cal as well....td

## 2013-02-08 ENCOUNTER — Other Ambulatory Visit (HOSPITAL_BASED_OUTPATIENT_CLINIC_OR_DEPARTMENT_OTHER): Payer: Medicare Other

## 2013-02-08 DIAGNOSIS — D509 Iron deficiency anemia, unspecified: Secondary | ICD-10-CM

## 2013-02-08 LAB — CBC WITH DIFFERENTIAL/PLATELET
BASO%: 0.7 % (ref 0.0–2.0)
EOS%: 2.3 % (ref 0.0–7.0)
HCT: 37.9 % — ABNORMAL LOW (ref 38.4–49.9)
LYMPH%: 34.1 % (ref 14.0–49.0)
MCH: 32.4 pg (ref 27.2–33.4)
MCHC: 33.3 g/dL (ref 32.0–36.0)
NEUT%: 51.8 % (ref 39.0–75.0)
Platelets: 448 10*3/uL — ABNORMAL HIGH (ref 140–400)
RBC: 3.89 10*6/uL — ABNORMAL LOW (ref 4.20–5.82)
WBC: 7.9 10*3/uL (ref 4.0–10.3)

## 2013-02-10 ENCOUNTER — Telehealth: Payer: Self-pay | Admitting: *Deleted

## 2013-02-10 NOTE — Telephone Encounter (Signed)
Wife called for pt's CBC results.  Informed her Hbg stable at 12.2 and to have pt keep his next visit as scheduled in September.  She verbalized understanding.

## 2013-03-23 ENCOUNTER — Ambulatory Visit (INDEPENDENT_AMBULATORY_CARE_PROVIDER_SITE_OTHER): Payer: Medicare Other | Admitting: Family Medicine

## 2013-03-23 ENCOUNTER — Encounter: Payer: Self-pay | Admitting: Family Medicine

## 2013-03-23 VITALS — BP 126/74 | HR 85 | Temp 97.7°F | Ht 71.0 in | Wt 202.0 lb

## 2013-03-23 DIAGNOSIS — E785 Hyperlipidemia, unspecified: Secondary | ICD-10-CM

## 2013-03-23 DIAGNOSIS — I10 Essential (primary) hypertension: Secondary | ICD-10-CM

## 2013-03-23 DIAGNOSIS — D509 Iron deficiency anemia, unspecified: Secondary | ICD-10-CM

## 2013-03-23 DIAGNOSIS — R9431 Abnormal electrocardiogram [ECG] [EKG]: Secondary | ICD-10-CM

## 2013-03-23 DIAGNOSIS — E663 Overweight: Secondary | ICD-10-CM

## 2013-03-23 DIAGNOSIS — E559 Vitamin D deficiency, unspecified: Secondary | ICD-10-CM

## 2013-03-23 DIAGNOSIS — N200 Calculus of kidney: Secondary | ICD-10-CM | POA: Insufficient documentation

## 2013-03-23 DIAGNOSIS — R635 Abnormal weight gain: Secondary | ICD-10-CM

## 2013-03-23 DIAGNOSIS — K219 Gastro-esophageal reflux disease without esophagitis: Secondary | ICD-10-CM

## 2013-03-23 DIAGNOSIS — C61 Malignant neoplasm of prostate: Secondary | ICD-10-CM | POA: Insufficient documentation

## 2013-03-23 MED ORDER — PANTOPRAZOLE SODIUM 40 MG PO TBEC: 40.0000 mg | DELAYED_RELEASE_TABLET | Freq: Every day | ORAL | Status: DC

## 2013-03-23 NOTE — Patient Instructions (Addendum)
Gastroesophageal Reflux Disease, Adult Gastroesophageal reflux disease (GERD) happens when acid from your stomach flows up into the esophagus. When acid comes in contact with the esophagus, the acid causes soreness (inflammation) in the esophagus. Over time, GERD may create small holes (ulcers) in the lining of the esophagus. CAUSES   Increased body weight. This puts pressure on the stomach, making acid rise from the stomach into the esophagus.  Smoking. This increases acid production in the stomach.  Drinking alcohol. This causes decreased pressure in the lower esophageal sphincter (valve or ring of muscle between the esophagus and stomach), allowing acid from the stomach into the esophagus.  Late evening meals and a full stomach. This increases pressure and acid production in the stomach.  A malformed lower esophageal sphincter. Sometimes, no cause is found. SYMPTOMS   Burning pain in the lower part of the mid-chest behind the breastbone and in the mid-stomach area. This may occur twice a week or more often.  Trouble swallowing.  Sore throat.  Dry cough.  Asthma-like symptoms including chest tightness, shortness of breath, or wheezing. DIAGNOSIS  Your caregiver may be able to diagnose GERD based on your symptoms. In some cases, X-rays and other tests may be done to check for complications or to check the condition of your stomach and esophagus. TREATMENT  Your caregiver may recommend over-the-counter or prescription medicines to help decrease acid production. Ask your caregiver before starting or adding any new medicines.  HOME CARE INSTRUCTIONS   Change the factors that you can control. Ask your caregiver for guidance concerning weight loss, quitting smoking, and alcohol consumption.  Avoid foods and drinks that make your symptoms worse, such as:  Caffeine or alcoholic drinks.  Chocolate.  Peppermint or mint flavorings.  Garlic and onions.  Spicy foods.  Citrus fruits,  such as oranges, lemons, or limes.  Tomato-based foods such as sauce, chili, salsa, and pizza.  Fried and fatty foods.  Avoid lying down for the 3 hours prior to your bedtime or prior to taking a nap.  Eat small, frequent meals instead of large meals.  Wear loose-fitting clothing. Do not wear anything tight around your waist that causes pressure on your stomach.  Raise the head of your bed 6 to 8 inches with wood blocks to help you sleep. Extra pillows will not help.  Only take over-the-counter or prescription medicines for pain, discomfort, or fever as directed by your caregiver.  Do not take aspirin, ibuprofen, or other nonsteroidal anti-inflammatory drugs (NSAIDs). SEEK IMMEDIATE MEDICAL CARE IF:   You have pain in your arms, neck, jaw, teeth, or back.  Your pain increases or changes in intensity or duration.  You develop nausea, vomiting, or sweating (diaphoresis).  You develop shortness of breath, or you faint.  Your vomit is green, yellow, black, or looks like coffee grounds or blood.  Your stool is red, bloody, or black. These symptoms could be signs of other problems, such as heart disease, gastric bleeding, or esophageal bleeding. MAKE SURE YOU:   Understand these instructions.  Will watch your condition.  Will get help right away if you are not doing well or get worse. Document Released: 05/01/2005 Document Revised: 10/14/2011 Document Reviewed: 02/08/2011 Exit[employer] Patient Information 2014 [employer].        Dr Kaizer Dissinger's Recommendations  For nutrition information, I recommend books:  1).Eat to Live by Dr Joel Fuhrman. 2).Prevent and Reverse Heart Disease by Dr Caldwell Esselstyn. 3) Dr Neal Barnard's Book:  Program to Reverse Diabetes    Exercise recommendations are:  If unable to walk, then the patient can exercise in a chair 3 times a day. By flapping arms like a bird gently and raising legs outwards to the front.  If ambulatory, the patient  can go for walks for 30 minutes 3 times a week. Then increase the intensity and duration as tolerated.  Goal is to try to attain exercise frequency to 5 times a week.  If applicable: Best to perform resistance exercises (machines or weights) 2 days a week and cardio type exercises 3 days per week.  

## 2013-03-23 NOTE — Progress Notes (Signed)
Patient ID: Paul [name], male   DOB: 1940/09/16, 72 y.o.   MRN: [id number] SUBJECTIVE: CC: Chief Complaint  Patient presents with  . Follow-up    6 MONTH FOLLOW UP  C/O BURPING WITH SOUR BELCHES    HPI: Breakfast: eggs and grits one day, oatmeal next day, cereal the next day Lunch: differs, chicken salad and potato chips, sometimes vegetables Supper: last night: green peas and potatoes, October beans.  .Patient is here for follow up of hyperlipidemia/HTN denies Headache;denies Chest Pain;denies weakness;denies Shortness of Breath and orthopnea;denies Visual changes;denies palpitations;denies cough;denies pedal edema;denies symptoms of TIA or stroke;deniesClaudication symptoms. admits to Compliance with medications; denies Problems with medications.  Having GERD symptoms: after he eats and belches, fluid would come up in his throat. Worse if he lies down. No chest pain. This has been going on for months. TUMS relieves it. No weight loss. Went to a [employer] and ate a hotdog and potato salad and fixings and he belched and the sour fluids came up the chest.  Past Medical History  Diagnosis Date  . Hypertension   . Hyperlipidemia   . Iron deficiency anemia     presumped malabsorption; neg EGD/ capsule endos/colosopcy  . Iron deficiency anemia, unspecified 10/16/2012  . 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 Kidney stones    Past Surgical History  Procedure Laterality Date  . Robtic assisted laparoscopic      radical prostatectomy and bilateral pelvic lymphadenectomy   History   Social History  . Marital Status: Married    Spouse Name: N/A    Number of Children: N/A  . Years of Education: N/A   Occupational History  . Not on file.   Social History Main Topics  . Smoking status: Former Smoker    Quit date: 09/18/1981  . Smokeless tobacco: Not on file  .  Alcohol Use: No  . Drug Use: No  . Sexual Activity: Not on file   Other Topics Concern  . Not on file   Social History Narrative  . No narrative on file   Family History  Problem Relation Age of Onset  . Heart failure Mother   . Diabetes type II Mother    Current Outpatient Prescriptions on File Prior to Visit  Medication Sig Dispense Refill  . AMLODIPINE BESYLATE PO Take 5 mg by mouth daily.      Marland Kitchen aspirin 81 MG tablet Take 81 mg by mouth daily.      Marland Kitchen atorvastatin (LIPITOR) 40 MG tablet Take 40 mg by mouth daily.      . Cholecalciferol (VITAMIN D3) 2000 UNITS TABS Take 2,000 Units by mouth daily.      . fish oil-omega-3 fatty acids 1000 MG capsule Take 2 g by mouth daily.      . Iron 66 MG TABS Take 1 tablet by mouth daily.      Marland Kitchen lisinopril (PRINIVIL,ZESTRIL) 20 MG tablet Take 20 mg by mouth daily.       No current facility-administered medications on file prior to visit.   Allergies  Allergen Reactions  . Sulfonamide Derivatives     REACTION: rash    There is no immunization history on file for this patient. Prior to Admission medications   Medication Sig Start Date End Date Taking? Authorizing Provider  AMLODIPINE BESYLATE PO Take 5 mg by mouth daily.   Yes Historical Provider,  MD  aspirin 81 MG tablet Take 81 mg by mouth daily.   Yes Historical Provider, MD  atorvastatin (LIPITOR) 40 MG tablet Take 40 mg by mouth daily.   Yes Historical Provider, MD  Cholecalciferol (VITAMIN D3) 2000 UNITS TABS Take 2,000 Units by mouth daily.   Yes Historical Provider, MD  fish oil-omega-3 fatty acids 1000 MG capsule Take 2 g by mouth daily.   Yes Historical Provider, MD  Iron 66 MG TABS Take 1 tablet by mouth daily.   Yes Historical Provider, MD  lisinopril (PRINIVIL,ZESTRIL) 20 MG tablet Take 20 mg by mouth daily.   Yes Historical Provider, MD    ROS: As above in the HPI. All other systems are stable or negative.  OBJECTIVE: APPEARANCE:  Patient in no acute distress.The  patient appeared well nourished and normally developed. Acyanotic. Waist: VITAL SIGNS:BP 126/74  Pulse 85  Temp(Src) 97.7 F (36.5 C) (Oral)  Ht 5\' 11"  (1.803 m)  Wt 202 lb (91.627 kg)  BMI 28.19 kg/m2 WM Quiet man  SKIN: warm and  Dry without overt rashes, tattoos and scars  HEAD and Neck: without JVD, Head and scalp: normal Eyes:No scleral icterus. Fundi normal, eye movements normal. Ears: Auricle normal, canal normal, Tympanic membranes normal, insufflation normal. Nose: normal Throat: normal Neck & thyroid: normal  CHEST & LUNGS: Chest wall: normal Lungs: Clear  CVS: Reveals the PMI to be normally located. Regular rhythm, First and Second Heart sounds are normal,  absence of murmurs, rubs or gallops. Peripheral vasculature: Radial pulses: normal Dorsal pedis pulses: normal Posterior pulses: normal  ABDOMEN:  Appearance: central overweight Benign, no organomegaly, no masses, no Abdominal Aortic enlargement. No Guarding , no rebound. No Bruits. Bowel sounds: normal  RECTAL: N/A GU: N/A  EXTREMETIES: nonedematous.  MUSCULOSKELETAL:  Spine: normal Joints: intact  NEUROLOGIC: oriented to time,place and person; nonfocal.  ASSESSMENT: Essential hypertension, benign - Plan: CMP14+EGFR  HYPERLIPIDEMIA-MIXED - Plan: CMP14+EGFR, NMR, lipoprofile  Iron deficiency anemia, unspecified  OVERWEIGHT  Adenocarcinoma of prostate  Kidney stones  ABNORMAL ELECTROCARDIOGRAM  Unspecified vitamin D deficiency - Plan: Vit D25 hydroxy(osteoporosis monitoring)  GERD (gastroesophageal reflux disease) - Plan: pantoprazole (PROTONIX) 40 MG tablet   PLAN: Orders Placed This Encounter  Procedures  . CMP14+EGFR  . NMR, lipoprofile  . Vit D25 hydroxy(osteoporosis monitoring)    Meds ordered this encounter  Medications  . pantoprazole (PROTONIX) 40 MG tablet    Sig: Take 1 tablet (40 mg total) by mouth daily.    Dispense:  30 tablet    Refill:  3    Discussed  reducing belching by reducing aerophagia.      Dr Woodroe Mode Recommendations  For nutrition information, I recommend books:  1).Eat to Live by Dr Monico Hoar. 2).Prevent and Reverse Heart Disease by Dr Suzzette Righter. 3) Dr Katherina Right Book:  Program to Reverse Diabetes  Exercise recommendations are:  If unable to walk, then the patient can exercise in a chair 3 times a day. By flapping arms like a bird gently and raising legs outwards to the front.  If ambulatory, the patient can go for walks for 30 minutes 3 times a week. Then increase the intensity and duration as tolerated.  Goal is to try to attain exercise frequency to 5 times a week.  If applicable: Best to perform resistance exercises (machines or weights) 2 days a week and cardio type exercises 3 days per week.  GERD handout in the AVS. Discussed steps to take to control GERD  symptoms.  Discussed risks -benefits of medications(PPI).  Continue other medications. Keep follow up with the Urologist and Dr Gaylyn Rong the hematologist. Will leave the CBC and the PSA for the relevant specialist to follow.   Return in about 4 months (around 07/23/2013) for Recheck medical problems.  Star Cheese P. [place] Charon, M.D.

## 2013-03-25 ENCOUNTER — Other Ambulatory Visit: Payer: Self-pay | Admitting: Family Medicine

## 2013-03-25 DIAGNOSIS — R899 Unspecified abnormal finding in specimens from other organs, systems and tissues: Secondary | ICD-10-CM

## 2013-03-25 DIAGNOSIS — R1013 Epigastric pain: Secondary | ICD-10-CM

## 2013-03-25 LAB — NMR, LIPOPROFILE
Cholesterol: 170 mg/dL (ref <200)
HDL Cholesterol by NMR: 54 mg/dL (ref >=40)
HDL Particle Number: 35.6 umol/L (ref >=30.5)
LDL Particle Number: 1098 nmol/L — ABNORMAL HIGH (ref <1000)
LDL Size: 20.5 nm — ABNORMAL LOW (ref >20.5)
LDLC SERPL CALC-MCNC: 82 mg/dL (ref <100)
LP-IR Score: 44 (ref <=45)
Small LDL Particle Number: 540 nmol/L — ABNORMAL HIGH (ref <=527)
Triglycerides by NMR: 169 mg/dL — ABNORMAL HIGH (ref <150)

## 2013-03-25 LAB — CMP14+EGFR
ALT: 22 IU/L (ref 0–44)
AST: 24 IU/L (ref 0–40)
Albumin/Globulin Ratio: 1.5 (ref 1.1–2.5)
Albumin: 4.6 g/dL (ref 3.5–4.8)
Alkaline Phosphatase: 126 IU/L — ABNORMAL HIGH (ref 39–117)
BUN/Creatinine Ratio: 13 (ref 10–22)
BUN: 14 mg/dL (ref 8–27)
CO2: 24 mmol/L (ref 18–29)
Calcium: 10.2 mg/dL (ref 8.6–10.2)
Chloride: 98 mmol/L (ref 97–108)
Creatinine, Ser: 1.06 mg/dL (ref 0.76–1.27)
GFR calc Af Amer: 81 mL/min/{1.73_m2} (ref >59)
GFR calc non Af Amer: 70 mL/min/{1.73_m2} (ref >59)
Globulin, Total: 3 g/dL (ref 1.5–4.5)
Glucose: 118 mg/dL — ABNORMAL HIGH (ref 65–99)
Potassium: 4.9 mmol/L (ref 3.5–5.2)
Sodium: 138 mmol/L (ref 134–144)
Total Bilirubin: 0.3 mg/dL (ref 0.0–1.2)
Total Protein: 7.6 g/dL (ref 6.0–8.5)

## 2013-03-25 LAB — H PYLORI, IGM, IGG, IGA AB
H Pylori IgG: 6.9 U/mL — ABNORMAL HIGH (ref 0.0–0.8)
H. pylori, IgA Abs: 28.4 units — ABNORMAL HIGH (ref 0.0–8.9)
H. pylori, IgM Abs: <9 units (ref 0.0–8.9)

## 2013-03-25 LAB — PSA: PSA: <0.1 ng/mL (ref 0.0–4.0)

## 2013-03-25 LAB — VITAMIN D 25 HYDROXY (VIT D DEFICIENCY, FRACTURES): Vit D, 25-Hydroxy: 30.2 ng/mL (ref 30.0–100.0)

## 2013-03-25 NOTE — Progress Notes (Signed)
Quick Note:  Labs abnormal. H pylori test is positive. Need to do a stool test to see if he has the bacteria that can cause ulcers and some of his indigestion symptoms. Ordered in Epic.  Rest of labs okay with present medications.  ______

## 2013-03-26 ENCOUNTER — Telehealth: Payer: Self-pay | Admitting: Family Medicine

## 2013-03-29 NOTE — Telephone Encounter (Signed)
Spoke with wife and will come get the stool test for the h pyloric

## 2013-04-01 ENCOUNTER — Other Ambulatory Visit (INDEPENDENT_AMBULATORY_CARE_PROVIDER_SITE_OTHER): Payer: Medicare Other

## 2013-04-01 DIAGNOSIS — Z1212 Encounter for screening for malignant neoplasm of rectum: Secondary | ICD-10-CM

## 2013-04-02 LAB — FECAL OCCULT BLOOD, IMMUNOCHEMICAL: Fecal Occult Bld: NEGATIVE

## 2013-04-05 ENCOUNTER — Other Ambulatory Visit: Payer: Medicare Other | Admitting: Lab

## 2013-04-05 ENCOUNTER — Ambulatory Visit: Payer: Medicare Other | Admitting: Oncology

## 2013-04-06 ENCOUNTER — Ambulatory Visit (HOSPITAL_BASED_OUTPATIENT_CLINIC_OR_DEPARTMENT_OTHER): Payer: Medicare Other | Admitting: Hematology and Oncology

## 2013-04-06 ENCOUNTER — Telehealth: Payer: Self-pay | Admitting: Hematology and Oncology

## 2013-04-06 ENCOUNTER — Other Ambulatory Visit (HOSPITAL_BASED_OUTPATIENT_CLINIC_OR_DEPARTMENT_OTHER): Payer: Medicare Other | Admitting: Lab

## 2013-04-06 VITALS — BP 125/70 | HR 72 | Temp 97.4°F | Resp 18 | Ht 71.0 in | Wt 202.6 lb

## 2013-04-06 DIAGNOSIS — D509 Iron deficiency anemia, unspecified: Secondary | ICD-10-CM

## 2013-04-06 LAB — COMPREHENSIVE METABOLIC PANEL (CC13)
ALT: 23 U/L (ref 0–55)
Albumin: 3.7 g/dL (ref 3.5–5.0)
BUN: 16.7 mg/dL (ref 7.0–26.0)
CO2: 24 mEq/L (ref 22–29)
Calcium: 9.7 mg/dL (ref 8.4–10.4)
Chloride: 106 mEq/L (ref 98–109)
Creatinine: 1 mg/dL (ref 0.7–1.3)
Potassium: 4.1 mEq/L (ref 3.5–5.1)

## 2013-04-06 LAB — IRON AND TIBC CHCC
TIBC: 285 ug/dL (ref 202–409)
UIBC: 123 ug/dL (ref 117–376)

## 2013-04-06 LAB — CBC WITH DIFFERENTIAL/PLATELET
BASO%: 0.8 % (ref 0.0–2.0)
Eosinophils Absolute: 0.2 10*3/uL (ref 0.0–0.5)
HCT: 41.4 % (ref 38.4–49.9)
LYMPH%: 36.1 % (ref 14.0–49.0)
MONO#: 0.9 10*3/uL (ref 0.1–0.9)
NEUT#: 3.8 10*3/uL (ref 1.5–6.5)
Platelets: 312 10*3/uL (ref 140–400)
RBC: 4.35 10*6/uL (ref 4.20–5.82)
WBC: 7.7 10*3/uL (ref 4.0–10.3)
lymph#: 2.8 10*3/uL (ref 0.9–3.3)

## 2013-04-06 LAB — FERRITIN CHCC: Ferritin: 24 ng/ml (ref 22–316)

## 2013-04-06 NOTE — Telephone Encounter (Signed)
Gavept appt for labs and MD on November , January and MArch 2015

## 2013-04-07 NOTE — Progress Notes (Signed)
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   PAST THERAPY: IV iron   CURRENT THERAPY: oral iron.   INTERVAL HISTORY:  Paul [name] 72 y.o. male who returns for follow up visit. His appetite is good. He has had extensive work up for iron deficiency anemia was negative including EGD/capsule endo/colonoscopy. Bone marrow biopsy was done and it was normal. He received IV iron with normalization of his anemia. Mrs. Tigges presented today with his wife. Patient complains on constipation which he controled with prune juice. Occasionally he saw blood on tissue secondary to hemorrhoids. He had negative colonoscopy in 2010. He has annual follow up with  his urologist  for his history of prostate cancer. The patient denied fever, chills, night sweats, change in weight. He denied headaches, double vision, blurry vision, nasal congestion, nasal discharge, hearing problems, odynophagia or dysphagia. No chest pain, palpitations, dyspnea, cough, abdominal pain, nausea, vomiting, diarrhea. The patient denied dysuria, nocturia, polyuria, hematuria, myalgia, numbness, tingling, psychiatric problems.  PAST MEDICAL HISTORY: Past Medical History  Diagnosis Date  . Hypertension   . Hyperlipidemia   . Iron deficiency anemia     presumped malabsorption; neg EGD/ capsule endos/colosopcy  . Iron deficiency anemia, unspecified 10/16/2012  . 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 Kidney stones     PAST SURGICAL HISTORY: Past Surgical History  Procedure Laterality Date  . Robtic assisted laparoscopic      radical prostatectomy and bilateral pelvic  lymphadenectomy    FAMILY HISTORY Family History  Problem Relation Age of Onset  . Heart failure Mother   . Diabetes type II Mother    HEALTH MAINTENANCE: History  Substance Use Topics  . Smoking status: Former Smoker    Quit date: 09/18/1981  . Smokeless tobacco: Not on file  . Alcohol Use: No    Allergies  Allergen Reactions  . Sulfonamide Derivatives     REACTION: rash    Current Outpatient Prescriptions  Medication Sig Dispense Refill  . AMLODIPINE BESYLATE PO Take 5 mg by mouth daily.      Marland Kitchen aspirin 81 MG tablet Take 81 mg by mouth daily.      Marland Kitchen atorvastatin (LIPITOR) 40 MG tablet Take 40 mg by mouth daily.      . Cholecalciferol (VITAMIN D3) 2000 UNITS TABS Take 2,000 Units by mouth daily.      . fish oil-omega-3 fatty acids 1000 MG capsule Take 2 g by mouth daily.      . Iron 66 MG TABS Take 1 tablet by mouth daily.      Marland Kitchen lisinopril (PRINIVIL,ZESTRIL) 20 MG tablet Take 20 mg by mouth daily.      . pantoprazole (PROTONIX) 40 MG tablet Take 1 tablet (40 mg total) by mouth daily.  30 tablet  3   No current facility-administered medications for this visit.    OBJECTIVE: Filed Vitals:   04/06/13 1040  BP: 125/70  Pulse: 72  Temp: 97.4 F (36.3 C)  Resp: 18     Body mass index is 28.27 kg/(m^2).    ECOG FS: 0 PHYSICAL EXAMINATION:  HEENT: Sclerae anicteric.  Conjunctivae were pink. Pupils round and reactive bilaterally.  Oral mucosa is moist without ulceration or thrush. No occipital, submandibular, cervical, supraclavicular or axillar adenopathy. Lungs: clear to auscultation without wheezes. No rales or rhonchi. Heart: regular rate and rhythm. No murmur, gallop or rubs. Abdomen: soft, non tender. No guarding or rebound tenderness. Bowel sounds are present. No palpable hepatosplenomegaly. MSK: no focal spinal tenderness. Extremities: No clubbing or cyanosis.No calf tenderness to palpitation, no peripheral edema. The patient had grossly intact strength in upper  and lower extremities. Skin exam was without ecchymosis, petechiae. Neuro: non-focal, diminished hearing on left. Alert and oriented to time, person and place, appropriate affect  LAB RESULTS:  CMP     Component Value Date/Time   NA 141 04/06/2013 0956   NA 138 03/23/2013 1004   NA 136 07/27/2009 1406   K 4.1 04/06/2013 0956   K 4.9 03/23/2013 1004   CL 98 03/23/2013 1004   CL 106 10/16/2012 1022   CO2 24 04/06/2013 0956   CO2 24 03/23/2013 1004   GLUCOSE 116 04/06/2013 0956   GLUCOSE 118* 03/23/2013 1004   GLUCOSE 105* 10/16/2012 1022   GLUCOSE 129* 07/27/2009 1406   BUN 16.7 04/06/2013 0956   BUN 14 03/23/2013 1004   BUN 18 07/27/2009 1406   CREATININE 1.0 04/06/2013 0956   CREATININE 1.06 03/23/2013 1004   CALCIUM 9.7 04/06/2013 0956   CALCIUM 10.2 03/23/2013 1004   PROT 7.3 04/06/2013 0956   PROT 7.6 03/23/2013 1004   PROT 8.0 07/27/2009 1406   ALBUMIN 3.7 04/06/2013 0956   ALBUMIN 4.5 07/27/2009 1406   AST 24 04/06/2013 0956   AST 24 03/23/2013 1004   ALT 23 04/06/2013 0956   ALT 22 03/23/2013 1004   ALKPHOS 101 04/06/2013 0956   ALKPHOS 126* 03/23/2013 1004   BILITOT 0.34 04/06/2013 0956   BILITOT 0.3 03/23/2013 1004   GFRNONAA 70 03/23/2013 1004   GFRAA 81 03/23/2013 1004    Lab Results  Component Value Date   WBC 7.7 04/06/2013   NEUTROABS 3.8 04/06/2013   HGB 14.0 04/06/2013   HCT 41.4 04/06/2013   MCV 95.2 04/06/2013   PLT 312 04/06/2013      Chemistry      Component Value Date/Time   NA 141 04/06/2013 0956   NA 138 03/23/2013 1004   NA 136 07/27/2009 1406   K 4.1 04/06/2013 0956   K 4.9 03/23/2013 1004   CL 98 03/23/2013 1004   CL 106 10/16/2012 1022   CO2 24 04/06/2013 0956   CO2 24 03/23/2013 1004   BUN 16.7 04/06/2013 0956   BUN 14 03/23/2013 1004   BUN 18 07/27/2009 1406   CREATININE 1.0 04/06/2013 0956   CREATININE 1.06 03/23/2013 1004      Component Value Date/Time   CALCIUM 9.7 04/06/2013 0956   CALCIUM 10.2 03/23/2013 1004   ALKPHOS 101 04/06/2013 0956   ALKPHOS 126* 03/23/2013 1004   AST 24  04/06/2013 0956   AST 24 03/23/2013 1004   ALT 23 04/06/2013 0956   ALT 22 03/23/2013 1004   BILITOT 0.34 04/06/2013 0956   BILITOT 0.3 03/23/2013 1004       ASSESSMENT AND PLAN: 1. Iron deficiency anemia:  - He received IV iron with normalization of his Hgb. He remains or oral iron. His CBC is normal today - Recommendation: Continue iron pills p.o.Marland Kitchen - Follow up: CBC here at the Cancer Center in 2, 4 months. Return visit in 6 months.    Myra Rude, MD   04/07/2013 1:57 PM

## 2013-04-16 ENCOUNTER — Other Ambulatory Visit: Payer: Medicare Other

## 2013-04-20 LAB — H PYLORI, IGM, IGG, IGA AB

## 2013-04-21 LAB — SPECIMEN STATUS REPORT

## 2013-04-21 LAB — H. PYLORI ANTIGEN, STOOL: H pylori Ag, Stl: POSITIVE — AB

## 2013-05-10 ENCOUNTER — Ambulatory Visit: Payer: Medicare Other

## 2013-05-10 DIAGNOSIS — Z23 Encounter for immunization: Secondary | ICD-10-CM

## 2013-06-07 ENCOUNTER — Encounter (INDEPENDENT_AMBULATORY_CARE_PROVIDER_SITE_OTHER): Payer: Self-pay

## 2013-06-07 ENCOUNTER — Telehealth: Payer: Self-pay | Admitting: Hematology and Oncology

## 2013-06-07 ENCOUNTER — Other Ambulatory Visit (HOSPITAL_BASED_OUTPATIENT_CLINIC_OR_DEPARTMENT_OTHER): Payer: Medicare Other

## 2013-06-07 ENCOUNTER — Ambulatory Visit (HOSPITAL_BASED_OUTPATIENT_CLINIC_OR_DEPARTMENT_OTHER): Payer: Medicare Other | Admitting: Hematology and Oncology

## 2013-06-07 ENCOUNTER — Encounter: Payer: Self-pay | Admitting: Hematology and Oncology

## 2013-06-07 ENCOUNTER — Other Ambulatory Visit: Payer: Self-pay | Admitting: Hematology and Oncology

## 2013-06-07 VITALS — BP 132/65 | HR 101 | Temp 96.9°F | Resp 18 | Ht 71.0 in | Wt 201.7 lb

## 2013-06-07 DIAGNOSIS — K921 Melena: Secondary | ICD-10-CM

## 2013-06-07 DIAGNOSIS — D509 Iron deficiency anemia, unspecified: Secondary | ICD-10-CM

## 2013-06-07 DIAGNOSIS — A048 Other specified bacterial intestinal infections: Secondary | ICD-10-CM

## 2013-06-07 LAB — CBC & DIFF AND RETIC
BASO%: 0.3 % (ref 0.0–2.0)
EOS%: 1.2 % (ref 0.0–7.0)
Immature Retic Fract: 25.4 % — ABNORMAL HIGH (ref 3.00–10.60)
MCH: 31.1 pg (ref 27.2–33.4)
MCHC: 32.6 g/dL (ref 32.0–36.0)
MONO#: 0.9 10*3/uL (ref 0.1–0.9)
RBC: 3.34 10*6/uL — ABNORMAL LOW (ref 4.20–5.82)
RDW: 15.3 % — ABNORMAL HIGH (ref 11.0–14.6)
Retic Ct Abs: 150.3 10*3/uL — ABNORMAL HIGH (ref 34.80–93.90)
WBC: 11.5 10*3/uL — ABNORMAL HIGH (ref 4.0–10.3)
lymph#: 3 10*3/uL (ref 0.9–3.3)
nRBC: 0 % (ref 0–0)

## 2013-06-07 LAB — IRON AND TIBC CHCC: TIBC: 296 ug/dL (ref 202–409)

## 2013-06-07 NOTE — Progress Notes (Signed)
[facility] OFFICE PROGRESS NOTE  Redmond Baseman, MD DIAGNOSIS:  Iron deficiency anemia  SUMMARY OF HEMATOLOGIC HISTORY: This is a pleasant gentleman who has extensive workup in 2010 for anemia. He had EGD colonoscopy and capsule endoscopy that came back negative. Bone marrow aspirate and biopsy was negative. The patient was also diagnosed with prostate cancer and underwent prostatectomy. Staging is unknown but he never require any adjuvant treatment. INTERVAL HISTORY: Paul [name] 72 y.o. male returns for followup. He complained of some mild fatigue. The patient had intermittent hematochezia for hemorrhoidal bleeding, twice a week on average. He denies any epistaxis or hematuria. Denies any dizziness, chest pain or shortness of breath on exertion. His appetite is stable no recent weight loss. Recently he was tested positive for H. pylori infection in the stool. He was placed on a proton pump inhibitor but never received antibiotics for this.  I have reviewed the past medical history, past surgical history, social history and family history with the patient and they are unchanged from previous note.  ALLERGIES:  is allergic to sulfonamide derivatives.  MEDICATIONS:  Current Outpatient Prescriptions  Medication Sig Dispense Refill  . AMLODIPINE BESYLATE PO Take 5 mg by mouth daily.      Marland Kitchen aspirin 81 MG tablet Take 81 mg by mouth daily.      Marland Kitchen atorvastatin (LIPITOR) 40 MG tablet Take 40 mg by mouth daily.      . Cholecalciferol (VITAMIN D3) 2000 UNITS TABS Take 2,000 Units by mouth daily.      . fish oil-omega-3 fatty acids 1000 MG capsule Take 2 g by mouth daily.      . Iron 66 MG TABS Take 1 tablet by mouth daily.      Marland Kitchen lisinopril (PRINIVIL,ZESTRIL) 20 MG tablet Take 20 mg by mouth daily.      . pantoprazole (PROTONIX) 40 MG tablet Take 1 tablet (40 mg total) by mouth daily.  30 tablet  3   No current facility-administered medications for this visit.      REVIEW OF SYSTEMS:   Constitutional: Denies fevers, chills or night sweats Eyes: Denies blurriness of vision Ears, nose, mouth, throat, and face: Denies mucositis or sore throat Respiratory: Denies cough, dyspnea or wheezes Cardiovascular: Denies palpitation, chest discomfort or lower extremity swelling Gastrointestinal:  Denies nausea, heartburn or change in bowel habits Skin: Denies abnormal skin rashes Lymphatics: Denies new lymphadenopathy or easy bruising Neurological:Denies numbness, tingling or new weaknesses Behavioral/Psych: Mood is stable, no new changes  All other systems were reviewed with the patient and are negative.  PHYSICAL EXAMINATION: ECOG PERFORMANCE STATUS: 1 - Symptomatic but completely ambulatory  Filed Vitals:   06/07/13 1015  BP: 132/65  Pulse: 101  Temp: 96.9 F (36.1 C)  Resp: 18   Filed Weights   06/07/13 1015  Weight: 201 lb 11.2 oz (91.491 kg)    GENERAL:alert, no distress and comfortable. Patient is pale. He is mildly obese SKIN: skin color, texture, turgor are normal, no rashes or significant lesions EYES: normal, Conjunctiva are pink and non-injected, sclera clear OROPHARYNX:no exudate, no erythema and lips, buccal mucosa, and tongue normal  NECK: supple, thyroid normal size, non-tender, without nodularity LYMPH:  no palpable lymphadenopathy in the cervical, axillary or inguinal LUNGS: clear to auscultation and percussion with normal breathing effort HEART: regular rate & rhythm and no murmurs and no lower extremity edema ABDOMEN:abdomen soft, non-tender and normal bowel sounds Musculoskeletal:no cyanosis of digits and no clubbing  NEURO: alert &  oriented x 3 with fluent speech, no focal motor/sensory deficits  LABORATORY DATA:  I have reviewed the data as listed  ASSESSMENT:  Iron deficiency anemia with a significant drop in blood count, GI bleeding until proven otherwise  PLAN:  #1 iron deficiency anemia #2 positive H. pylori  test I will get in touch with his referring physician about the positive H. pylori testing, whether he would need triple therapy for this. He is not taking proton pump inhibitor on a regular basis and I told him to take it in the morning. The patient has significant drop in his hemoglobin compared to the one done only 2 months ago. This is GI bleed until proven otherwise. I am waiting for the ferritin level. I instructed the patient to take 1 oral iron tablet twice a day half an hour before meals in the midday and evening. I will see him back in a month with repeat ferritin and CBC. If his hemoglobin drops further, I recommend repeat GI evaluation. At present time he does not require blood transfusion. All questions were answered. The patient knows to call the clinic with any problems, questions or concerns. No barriers to learning was detected.  I spent 25 minutes counseling the patient face to face. The total time spent in the appointment was 30 minutes and more than 50% was on counseling.     Leauna Sharber, MD 06/07/2013 10:51 AM

## 2013-06-07 NOTE — Telephone Encounter (Signed)
gv and printed appt sched and avs for pt for DEC °

## 2013-07-05 ENCOUNTER — Telehealth: Payer: Self-pay | Admitting: *Deleted

## 2013-07-05 ENCOUNTER — Other Ambulatory Visit (HOSPITAL_BASED_OUTPATIENT_CLINIC_OR_DEPARTMENT_OTHER): Payer: Medicare Other | Admitting: Lab

## 2013-07-05 ENCOUNTER — Ambulatory Visit (HOSPITAL_BASED_OUTPATIENT_CLINIC_OR_DEPARTMENT_OTHER): Payer: Medicare Other | Admitting: Hematology and Oncology

## 2013-07-05 VITALS — BP 126/67 | HR 86 | Temp 98.2°F | Resp 18 | Ht 71.0 in | Wt 205.2 lb

## 2013-07-05 DIAGNOSIS — D509 Iron deficiency anemia, unspecified: Secondary | ICD-10-CM

## 2013-07-05 LAB — CBC & DIFF AND RETIC
Basophils Absolute: 0 10*3/uL (ref 0.0–0.1)
Eosinophils Absolute: 0.1 10*3/uL (ref 0.0–0.5)
HGB: 10.6 g/dL — ABNORMAL LOW (ref 13.0–17.1)
Immature Retic Fract: 17.7 % — ABNORMAL HIGH (ref 3.00–10.60)
LYMPH%: 27 % (ref 14.0–49.0)
MCV: 103.7 fL — ABNORMAL HIGH (ref 79.3–98.0)
MONO#: 0.7 10*3/uL (ref 0.1–0.9)
MONO%: 9.8 % (ref 0.0–14.0)
NEUT#: 4.4 10*3/uL (ref 1.5–6.5)
Platelets: 408 10*3/uL — ABNORMAL HIGH (ref 140–400)
RBC: 3.25 10*6/uL — ABNORMAL LOW (ref 4.20–5.82)
RDW: 15.3 % — ABNORMAL HIGH (ref 11.0–14.6)
Retic %: 5.28 % — ABNORMAL HIGH (ref 0.80–1.80)
WBC: 7.2 10*3/uL (ref 4.0–10.3)
nRBC: 0 % (ref 0–0)

## 2013-07-05 NOTE — Progress Notes (Signed)
[facility] OFFICE PROGRESS NOTE  Redmond Baseman, MD DIAGNOSIS:  Iron deficiency anemia  SUMMARY OF HEMATOLOGIC HISTORY: This is a pleasant gentleman who has extensive workup in 2010 for anemia. He had EGD colonoscopy and capsule endoscopy that came back negative. Bone marrow aspirate and biopsy was negative. The patient was also diagnosed with prostate cancer and underwent prostatectomy. Staging is unknown but he never require any adjuvant treatment. INTERVAL HISTORY: Paul [name] 72 y.o. male returns for further followup. He continued to have hemorrhoidal bleeding almost on a daily basis. He start taking iron supplement twice a day, half an hour before meals. The patient denies any recent signs or symptoms of bleeding such as spontaneous epistaxis, hematuria or hematochezia. He denies any symptoms of anemia such as dizziness, headaches, or chest pain on exertion.  I have reviewed the past medical history, past surgical history, social history and family history with the patient and they are unchanged from previous note.  ALLERGIES:  is allergic to sulfonamide derivatives.  MEDICATIONS:  Current Outpatient Prescriptions  Medication Sig Dispense Refill  . AMLODIPINE BESYLATE PO Take 5 mg by mouth daily.      Marland Kitchen aspirin 81 MG tablet Take 81 mg by mouth daily.      Marland Kitchen atorvastatin (LIPITOR) 40 MG tablet Take 40 mg by mouth daily.      . Cholecalciferol (VITAMIN D3) 2000 UNITS TABS Take 2,000 Units by mouth daily.      . fish oil-omega-3 fatty acids 1000 MG capsule Take 2 g by mouth daily.      . Iron 66 MG TABS Take 1 tablet by mouth 2 (two) times daily.       Marland Kitchen lisinopril (PRINIVIL,ZESTRIL) 20 MG tablet Take 20 mg by mouth daily.      . pantoprazole (PROTONIX) 40 MG tablet Take 1 tablet (40 mg total) by mouth daily.  30 tablet  3   No current facility-administered medications for this visit.     REVIEW OF SYSTEMS:   Constitutional: Denies fevers, chills or  night sweats Eyes: Denies blurriness of vision Ears, nose, mouth, throat, and face: Denies mucositis or sore throat Respiratory: Denies cough, dyspnea or wheezes Cardiovascular: Denies palpitation, chest discomfort or lower extremity swelling Gastrointestinal:  Denies nausea, heartburn or change in bowel habits Skin: Denies abnormal skin rashes Lymphatics: Denies new lymphadenopathy or easy bruising Neurological:Denies numbness, tingling or new weaknesses Behavioral/Psych: Mood is stable, no new changes  All other systems were reviewed with the patient and are negative.  PHYSICAL EXAMINATION: ECOG PERFORMANCE STATUS: 0 - Asymptomatic  Filed Vitals:   07/05/13 1307  BP: 126/67  Pulse: 86  Temp: 98.2 F (36.8 C)  Resp: 18   Filed Weights   07/05/13 1307  Weight: 205 lb 3.2 oz (93.078 kg)    GENERAL:alert, no distress and comfortable. He will obese and pale SKIN: skin color, texture, turgor are normal, no rashes or significant lesions EYES: normal, Conjunctiva are pink and non-injected, sclera clear OROPHARYNX:no exudate, no erythema and lips, buccal mucosa, and tongue normal  NECK: supple, thyroid normal size, non-tender, without nodularity LYMPH:  no palpable lymphadenopathy in the cervical, axillary or inguinal LUNGS: clear to auscultation and percussion with normal breathing effort HEART: regular rate & rhythm and no murmurs and no lower extremity edema ABDOMEN:abdomen soft, non-tender and normal bowel sounds Musculoskeletal:no cyanosis of digits and no clubbing  NEURO: alert & oriented x 3 with fluent speech, no focal motor/sensory deficits  LABORATORY DATA:  I have reviewed the data as listed Results for orders placed in visit on 07/05/13 (from the past 48 hour(s))  CBC & DIFF AND RETIC     Status: Abnormal   Collection Time    07/05/13 12:41 PM      Result Value Range   WBC 7.2  4.0 - 10.3 10e3/uL   NEUT# 4.4  1.5 - 6.5 10e3/uL   HGB 10.6 (*) 13.0 - 17.1 g/dL    HCT 47.4 (*) 25.9 - 49.9 %   Platelets 408 (*) 140 - 400 10e3/uL   MCV 103.7 (*) 79.3 - 98.0 fL   MCH 32.6  27.2 - 33.4 pg   MCHC 31.5 (*) 32.0 - 36.0 g/dL   RBC 5.63 (*) 8.75 - 6.43 10e6/uL   RDW 15.3 (*) 11.0 - 14.6 %   lymph# 2.0  0.9 - 3.3 10e3/uL   MONO# 0.7  0.1 - 0.9 10e3/uL   Eosinophils Absolute 0.1  0.0 - 0.5 10e3/uL   Basophils Absolute 0.0  0.0 - 0.1 10e3/uL   NEUT% 61.1  39.0 - 75.0 %   LYMPH% 27.0  14.0 - 49.0 %   MONO% 9.8  0.0 - 14.0 %   EOS% 1.7  0.0 - 7.0 %   BASO% 0.4  0.0 - 2.0 %   nRBC 0  0 - 0 %   Retic % 5.28 (*) 0.80 - 1.80 %   Retic Ct Abs 171.60 (*) 34.80 - 93.90 10e3/uL   Immature Retic Fract 17.70 (*) 3.00 - 10.60 %  LACTATE DEHYDROGENASE (CC13)     Status: None   Collection Time    07/05/13 12:41 PM      Result Value Range   LDH 156  125 - 245 U/L    Lab Results  Component Value Date   WBC 7.2 07/05/2013   HGB 10.6* 07/05/2013   HCT 33.7* 07/05/2013   MCV 103.7* 07/05/2013   PLT 408* 07/05/2013   ASSESSMENT & PLAN:  #1 iron deficiency anemia and intermittent GI bleed from hemorrhoids The patient continued to have bleeding. Even though his ferritin level has improved a little bit, and is not tolerating oral iron supplement. We discussed some of the risks, benefits, and alternatives of intravenous iron infusions. The patient is symptomatic from anemia and the iron level is critically low. He tolerated oral iron supplement poorly and desires to achieved higher levels of iron faster for adequate hematopoesis. Some of the side-effects to be expected including risks of infusion reactions, phlebitis, headaches, nausea and fatigue.  The patient is willing to proceed. I will see him back in a month with repeat iron studies and blood work. If he is not able to retain his blood count, I might have to refer him back to GI service for further evaluation #2 hemorrhoidal bleeding I recommend regular stool softener. Also recommend him to stop his oral iron  supplement All questions were answered. The patient knows to call the clinic with any problems, questions or concerns. No barriers to learning was detected.     Lydiann Bonifas, MD 07/05/2013 1:43 PM

## 2013-07-05 NOTE — Telephone Encounter (Signed)
appts made and printed...td 

## 2013-07-06 ENCOUNTER — Telehealth: Payer: Self-pay | Admitting: Hematology and Oncology

## 2013-07-06 NOTE — Telephone Encounter (Signed)
Working on 12/1 POF printed order and left for Fransisca Kaufmann to see if she could schedule IV iron  since Marcelino Duster out of office Pended 12/1 POF w note to call pt shh

## 2013-07-07 ENCOUNTER — Telehealth: Payer: Self-pay | Admitting: Hematology and Oncology

## 2013-07-07 NOTE — Telephone Encounter (Signed)
sed added 12.5.14 tx...gv back to [facility] to call pt with appt d/t

## 2013-07-07 NOTE — Telephone Encounter (Signed)
sw wife adv of iron infusion for 12/5 at 11am shh

## 2013-07-09 ENCOUNTER — Encounter (INDEPENDENT_AMBULATORY_CARE_PROVIDER_SITE_OTHER): Payer: Self-pay

## 2013-07-09 ENCOUNTER — Ambulatory Visit (HOSPITAL_BASED_OUTPATIENT_CLINIC_OR_DEPARTMENT_OTHER): Payer: Medicare Other

## 2013-07-09 VITALS — BP 125/63 | HR 87 | Temp 97.8°F

## 2013-07-09 DIAGNOSIS — D509 Iron deficiency anemia, unspecified: Secondary | ICD-10-CM

## 2013-07-09 MED ORDER — SODIUM CHLORIDE 0.9 % IV SOLN
1020.0000 mg | Freq: Once | INTRAVENOUS | Status: AC
  Administered 2013-07-09: 1020 mg via INTRAVENOUS
  Filled 2013-07-09: qty 34

## 2013-07-09 MED ORDER — SODIUM CHLORIDE 0.9 % IV SOLN
Freq: Once | INTRAVENOUS | Status: AC
  Administered 2013-07-09: 11:00:00 via INTRAVENOUS

## 2013-07-09 NOTE — Patient Instructions (Signed)

## 2013-07-23 ENCOUNTER — Encounter (INDEPENDENT_AMBULATORY_CARE_PROVIDER_SITE_OTHER): Payer: Self-pay

## 2013-07-23 ENCOUNTER — Ambulatory Visit (INDEPENDENT_AMBULATORY_CARE_PROVIDER_SITE_OTHER): Payer: Medicare Other | Admitting: Family Medicine

## 2013-07-23 ENCOUNTER — Encounter: Payer: Self-pay | Admitting: Family Medicine

## 2013-07-23 VITALS — BP 126/64 | HR 91 | Temp 97.5°F | Ht 71.0 in | Wt 204.4 lb

## 2013-07-23 DIAGNOSIS — I10 Essential (primary) hypertension: Secondary | ICD-10-CM

## 2013-07-23 DIAGNOSIS — R9431 Abnormal electrocardiogram [ECG] [EKG]: Secondary | ICD-10-CM

## 2013-07-23 DIAGNOSIS — D509 Iron deficiency anemia, unspecified: Secondary | ICD-10-CM

## 2013-07-23 DIAGNOSIS — C61 Malignant neoplasm of prostate: Secondary | ICD-10-CM

## 2013-07-23 DIAGNOSIS — Z23 Encounter for immunization: Secondary | ICD-10-CM

## 2013-07-23 DIAGNOSIS — R5381 Other malaise: Secondary | ICD-10-CM

## 2013-07-23 DIAGNOSIS — E785 Hyperlipidemia, unspecified: Secondary | ICD-10-CM

## 2013-07-23 DIAGNOSIS — K219 Gastro-esophageal reflux disease without esophagitis: Secondary | ICD-10-CM

## 2013-07-23 DIAGNOSIS — E663 Overweight: Secondary | ICD-10-CM

## 2013-07-23 DIAGNOSIS — N200 Calculus of kidney: Secondary | ICD-10-CM

## 2013-07-23 NOTE — Progress Notes (Signed)
Patient ID: Paul [name], male   DOB: 06-16-1941, 72 y.o.   MRN: [id number] SUBJECTIVE: CC: Chief Complaint  Patient presents with  . Hyperlipidemia  . Hypertension  . Anemia  . Gastrophageal Reflux    HPI: Patient is here for follow up of hyperlipidemia/HTN/Anemia/GERD denies symptoms of TIA or stroke;deniesClaudication symptoms. admits to Compliance with medications; denies Problems with medications.  Doing fairly well and feeling better since the iron infusion. The hematologist is doing his anemia related lab work.   Past Medical History  Diagnosis Date  . Hypertension   . Hyperlipidemia   . Iron deficiency anemia     presumped malabsorption; neg EGD/ capsule endos/colosopcy  . Iron deficiency anemia, unspecified 10/16/2012  . 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 Kidney stones    Past Surgical History  Procedure Laterality Date  . Robtic assisted laparoscopic      radical prostatectomy and bilateral pelvic lymphadenectomy   History   Social History  . Marital Status: Married    Spouse Name: N/A    Number of Children: N/A  . Years of Education: N/A   Occupational History  . Not on file.   Social History Main Topics  . Smoking status: Former Smoker    Quit date: 09/18/1981  . Smokeless tobacco: Not on file  . Alcohol Use: No  . Drug Use: No  . Sexual Activity: Not on file   Other Topics Concern  . Not on file   Social History Narrative  . No narrative on file   Family History  Problem Relation Age of Onset  . Heart failure Mother   . Diabetes type II Mother    Current Outpatient Prescriptions on File Prior to Visit  Medication Sig Dispense Refill  . AMLODIPINE BESYLATE PO Take 5 mg by mouth daily.      Marland Kitchen aspirin 81 MG tablet Take 81 mg by mouth daily.      Marland Kitchen atorvastatin (LIPITOR) 40 MG tablet Take 40 mg by mouth daily.      .  Cholecalciferol (VITAMIN D3) 2000 UNITS TABS Take 2,000 Units by mouth daily.      . fish oil-omega-3 fatty acids 1000 MG capsule Take 2 g by mouth daily.      Marland Kitchen lisinopril (PRINIVIL,ZESTRIL) 20 MG tablet Take 20 mg by mouth daily.      . pantoprazole (PROTONIX) 40 MG tablet Take 1 tablet (40 mg total) by mouth daily.  30 tablet  3  . Iron 66 MG TABS Take 1 tablet by mouth 2 (two) times daily.        No current facility-administered medications on file prior to visit.   Allergies  Allergen Reactions  . Sulfonamide Derivatives     REACTION: rash   Immunization History  Administered Date(s) Administered  . Influenza,inj,Quad PF,36+ Mos 05/10/2013  . Pneumococcal Conjugate-13 07/23/2013   Prior to Admission medications   Medication Sig Start Date End Date Taking? Authorizing Provider  AMLODIPINE BESYLATE PO Take 5 mg by mouth daily.    Historical Provider, MD  aspirin 81 MG tablet Take 81 mg by mouth daily.    Historical Provider, MD  atorvastatin (LIPITOR) 40 MG tablet Take 40 mg by mouth daily.    Historical Provider, MD  Cholecalciferol (VITAMIN D3) 2000 UNITS TABS Take 2,000 Units by mouth daily.    Historical Provider, MD  fish oil-omega-3  fatty acids 1000 MG capsule Take 2 g by mouth daily.    Historical Provider, MD  Iron 66 MG TABS Take 1 tablet by mouth 2 (two) times daily.     Historical Provider, MD  lisinopril (PRINIVIL,ZESTRIL) 20 MG tablet Take 20 mg by mouth daily.    Historical Provider, MD  pantoprazole (PROTONIX) 40 MG tablet Take 1 tablet (40 mg total) by mouth daily. 03/23/13   Ileana Ladd, MD     ROS: As above in the HPI. All other systems are stable or negative.  OBJECTIVE: APPEARANCE:  Patient in no acute distress.The patient appeared well nourished and normally developed. Acyanotic. Waist: VITAL SIGNS:BP 126/64  Pulse 91  Temp(Src) 97.5 F (36.4 C) (Oral)  Ht 5\' 11"  (1.803 m)  Wt 204 lb 6.4 oz (92.715 kg)  BMI 28.52 kg/m2  WM overweight  SKIN:  warm and  Dry without overt rashes, tattoos and scars  HEAD and Neck: without JVD, Head and scalp: normal Eyes:No scleral icterus. Fundi normal, eye movements normal. Ears: Auricle normal, canal normal, Tympanic membranes normal, insufflation normal. Nose: normal Throat: normal Neck & thyroid: normal  CHEST & LUNGS: Chest wall: normal Lungs: Clear  CVS: Reveals the PMI to be normally located. Regular rhythm, First and Second Heart sounds are normal,  absence of murmurs, rubs or gallops. Peripheral vasculature: Radial pulses: normal Dorsal pedis pulses: normal Posterior pulses: normal  ABDOMEN:  Appearance: overweight Benign, no organomegaly, no masses, no Abdominal Aortic enlargement. No Guarding , no rebound. No Bruits. Bowel sounds: normal  RECTAL: N/A GU: N/A  EXTREMETIES: nonedematous.  MUSCULOSKELETAL:  Spine: normal Joints: intact  NEUROLOGIC: oriented to time,place and person; nonfocal. Strength is normal Sensory is normal Reflexes are normal Cranial Nerves are normal.  ASSESSMENT:  Hypertension - Plan: CMP14+EGFR  Hyperlipemia - Plan: CMP14+EGFR, Lipid panel  Iron deficiency anemia, unspecified  GERD (gastroesophageal reflux disease)  Essential hypertension, benign  HYPERLIPIDEMIA-MIXED  Overweight(278.02)  ABNORMAL ELECTROCARDIOGRAM  Adenocarcinoma of prostate  FATIGUE / MALAISE  Kidney stones  Need for vaccination - Plan: Pneumococcal conjugate vaccine 13-valent  PLAN:       Dr Woodroe Mode Recommendations  For nutrition information, I recommend books:  1).Eat to Live by Dr Monico Hoar. 2).Prevent and Reverse Heart Disease by Dr Suzzette Righter. 3) Dr Katherina Right Book:  Program to Reverse Diabetes  Exercise recommendations are:  If unable to walk, then the patient can exercise in a chair 3 times a day. By flapping arms like a bird gently and raising legs outwards to the front.  If ambulatory, the patient can go for  walks for 30 minutes 3 times a week. Then increase the intensity and duration as tolerated.  Goal is to try to attain exercise frequency to 5 times a week.  If applicable: Best to perform resistance exercises (machines or weights) 2 days a week and cardio type exercises 3 days per week.  Orders Placed This Encounter  Procedures  . Pneumococcal conjugate vaccine 13-valent  . CMP14+EGFR  . Lipid panel   No orders of the defined types were placed in this encounter.   There are no discontinued medications. Return in about 4 months (around 11/21/2013) for Recheck medical problems.  Chaquana Nichols P. Modesto Charon, M.D.

## 2013-07-23 NOTE — Patient Instructions (Addendum)
    Dr Bowie Doiron's Recommendations  For nutrition information, I recommend books:  1).Eat to Live by Dr Joel Fuhrman. 2).Prevent and Reverse Heart Disease by Dr Caldwell Esselstyn. 3) Dr Neal Barnard's Book:  Program to Reverse Diabetes  Exercise recommendations are:  If unable to walk, then the patient can exercise in a chair 3 times a day. By flapping arms like a bird gently and raising legs outwards to the front.  If ambulatory, the patient can go for walks for 30 minutes 3 times a week. Then increase the intensity and duration as tolerated.  Goal is to try to attain exercise frequency to 5 times a week.  If applicable: Best to perform resistance exercises (machines or weights) 2 days a week and cardio type exercises 3 days per week. Pneumococcal Vaccine, Polyvalent suspension for injection What is this medicine? PNEUMOCOCCAL VACCINE, POLYVALENT (NEU mo KOK al vak SEEN, pol ee VEY luhnt) is a vaccine to prevent pneumococcus bacteria infection. These bacteria are a major cause of ear infections, 'Strep throat' infections, and serious pneumonia, meningitis, or blood infections worldwide. These vaccines help the body to produce antibodies (protective substances) that help your body defend against these bacteria. This vaccine is recommended for infants and young children. This vaccine will not treat an infection. This medicine may be used for other purposes; ask your health care provider or pharmacist if you have questions. COMMON BRAND NAME(S): Prevnar 13 , Prevnar What should I tell my health care provider before I take this medicine? They need to know if you have any of these conditions: -bleeding problems -fever -immune system problems -low platelet count in the blood -seizures -an unusual or allergic reaction to pneumococcal vaccine, diphtheria toxoid, other vaccines, latex, other medicines, foods, dyes, or preservatives -pregnant or trying to get pregnant -breast-feeding How  should I use this medicine? This vaccine is for injection into a muscle. It is given by a health care professional. A copy of Vaccine Information Statements will be given before each vaccination. Read this sheet carefully each time. The sheet may change frequently. Talk to your pediatrician regarding the use of this medicine in children. While this drug may be prescribed for children as young as 6 weeks old for selected conditions, precautions do apply. Overdosage: If you think you have taken too much of this medicine contact a poison control center or emergency room at once. NOTE: This medicine is only for you. Do not share this medicine with others. What if I miss a dose? It is important not to miss your dose. Call your doctor or health care professional if you are unable to keep an appointment. What may interact with this medicine? -medicines for cancer chemotherapy -medicines that suppress your immune function -medicines that treat or prevent blood clots like warfarin, enoxaparin, and dalteparin -steroid medicines like prednisone or cortisone This list may not describe all possible interactions. Give your health care provider a list of all the medicines, herbs, non-prescription drugs, or dietary supplements you use. Also tell them if you smoke, drink alcohol, or use illegal drugs. Some items may interact with your medicine. What should I watch for while using this medicine? Mild fever and pain should go away in 3 days or less. Report any unusual symptoms to your doctor or health care professional. What side effects may I notice from receiving this medicine? Side effects that you should report to your doctor or health care professional as soon as possible: -allergic reactions like skin rash, itching   or hives, swelling of the face, lips, or tongue -breathing problems -confused -fever over 102 degrees F -pain, tingling, numbness in the hands or feet -seizures -unusual bleeding or  bruising -unusual muscle weakness Side effects that usually do not require medical attention (report to your doctor or health care professional if they continue or are bothersome): -aches and pains -diarrhea -fever of 102 degrees F or less -headache -irritable -loss of appetite -pain, tender at site where injected -trouble sleeping This list may not describe all possible side effects. Call your doctor for medical advice about side effects. You may report side effects to FDA at 1-800-FDA-1088. Where should I keep my medicine? This does not apply. This vaccine is given in a clinic, pharmacy, doctor's office, or other health care setting and will not be stored at home. NOTE: This sheet is a summary. It may not cover all possible information. If you have questions about this medicine, talk to your doctor, pharmacist, or health care provider.  2014, Elsevier/Gold Standard. (2008-10-04 10:17:22)  

## 2013-07-24 LAB — LIPID PANEL
Chol/HDL Ratio: 2.3 ratio units (ref 0.0–5.0)
Cholesterol, Total: 144 mg/dL (ref 100–199)
HDL: 62 mg/dL (ref >39)
LDL Calculated: 69 mg/dL (ref 0–99)
Triglycerides: 66 mg/dL (ref 0–149)
VLDL Cholesterol Cal: 13 mg/dL (ref 5–40)

## 2013-07-24 LAB — CMP14+EGFR
ALT: 20 IU/L (ref 0–44)
AST: 21 IU/L (ref 0–40)
Albumin/Globulin Ratio: 1.6 (ref 1.1–2.5)
Albumin: 4.1 g/dL (ref 3.5–4.8)
Alkaline Phosphatase: 113 IU/L (ref 39–117)
BUN/Creatinine Ratio: 10 (ref 10–22)
BUN: 10 mg/dL (ref 8–27)
CO2: 23 mmol/L (ref 18–29)
Calcium: 9.3 mg/dL (ref 8.6–10.2)
Chloride: 101 mmol/L (ref 97–108)
Creatinine, Ser: 1.03 mg/dL (ref 0.76–1.27)
GFR calc Af Amer: 84 mL/min/{1.73_m2} (ref >59)
GFR calc non Af Amer: 72 mL/min/{1.73_m2} (ref >59)
Globulin, Total: 2.5 g/dL (ref 1.5–4.5)
Glucose: 112 mg/dL — ABNORMAL HIGH (ref 65–99)
Potassium: 4.5 mmol/L (ref 3.5–5.2)
Sodium: 140 mmol/L (ref 134–144)
Total Bilirubin: 0.3 mg/dL (ref 0.0–1.2)
Total Protein: 6.6 g/dL (ref 6.0–8.5)

## 2013-08-06 ENCOUNTER — Ambulatory Visit (HOSPITAL_BASED_OUTPATIENT_CLINIC_OR_DEPARTMENT_OTHER): Payer: Medicare Other | Admitting: Hematology and Oncology

## 2013-08-06 ENCOUNTER — Telehealth: Payer: Self-pay | Admitting: Hematology and Oncology

## 2013-08-06 ENCOUNTER — Other Ambulatory Visit (HOSPITAL_BASED_OUTPATIENT_CLINIC_OR_DEPARTMENT_OTHER): Payer: Medicare Other

## 2013-08-06 VITALS — BP 119/59 | HR 82 | Temp 98.0°F | Resp 18 | Ht 71.0 in | Wt 203.7 lb

## 2013-08-06 DIAGNOSIS — D473 Essential (hemorrhagic) thrombocythemia: Secondary | ICD-10-CM

## 2013-08-06 DIAGNOSIS — K649 Unspecified hemorrhoids: Secondary | ICD-10-CM

## 2013-08-06 DIAGNOSIS — D509 Iron deficiency anemia, unspecified: Secondary | ICD-10-CM

## 2013-08-06 DIAGNOSIS — K922 Gastrointestinal hemorrhage, unspecified: Secondary | ICD-10-CM

## 2013-08-06 LAB — CBC & DIFF AND RETIC
BASO%: 0.4 % (ref 0.0–2.0)
Basophils Absolute: 0 10*3/uL (ref 0.0–0.1)
EOS ABS: 0.1 10*3/uL (ref 0.0–0.5)
EOS%: 1.3 % (ref 0.0–7.0)
HEMATOCRIT: 30.9 % — AB (ref 38.4–49.9)
HGB: 9.9 g/dL — ABNORMAL LOW (ref 13.0–17.1)
Immature Retic Fract: 16.2 % — ABNORMAL HIGH (ref 3.00–10.60)
LYMPH%: 38.1 % (ref 14.0–49.0)
MCH: 32.1 pg (ref 27.2–33.4)
MCHC: 32 g/dL (ref 32.0–36.0)
MCV: 100.3 fL — ABNORMAL HIGH (ref 79.3–98.0)
MONO#: 0.7 10*3/uL (ref 0.1–0.9)
MONO%: 8.7 % (ref 0.0–14.0)
NEUT#: 3.9 10*3/uL (ref 1.5–6.5)
NEUT%: 51.5 % (ref 39.0–75.0)
NRBC: 0 % (ref 0–0)
PLATELETS: 441 10*3/uL — AB (ref 140–400)
RBC: 3.08 10*6/uL — ABNORMAL LOW (ref 4.20–5.82)
RDW: 13.1 % (ref 11.0–14.6)
Retic %: 2.82 % — ABNORMAL HIGH (ref 0.80–1.80)
Retic Ct Abs: 86.86 10*3/uL (ref 34.80–93.90)
WBC: 7.6 10*3/uL (ref 4.0–10.3)
lymph#: 2.9 10*3/uL (ref 0.9–3.3)

## 2013-08-06 LAB — IRON AND TIBC CHCC
%SAT: 10 % — AB (ref 20–55)
Iron: 29 ug/dL — ABNORMAL LOW (ref 42–163)
TIBC: 306 ug/dL (ref 202–409)
UIBC: 277 ug/dL (ref 117–376)

## 2013-08-06 LAB — FERRITIN CHCC: Ferritin: 64 ng/ml (ref 22–316)

## 2013-08-06 NOTE — Telephone Encounter (Signed)
s.w. pt wife and advised on iron appt for wed 1.7.14...ok and aware

## 2013-08-06 NOTE — Telephone Encounter (Signed)
gv and printed appt sched and avs for pt for Jan 2015.Marland KitchenMarland Kitchenpt sched to see Dr. Penelope Coop on 1.9.15 @ 3:15pm

## 2013-08-06 NOTE — Progress Notes (Signed)
[place] OFFICE PROGRESS NOTE  [name], MD DIAGNOSIS:  Chronic GI bleed with iron deficiency anemia  SUMMARY OF HEMATOLOGIC HISTORY: This is a pleasant gentleman who has extensive workup in 2010 for anemia. He had EGD colonoscopy and capsule endoscopy that came back negative. Bone marrow aspirate and biopsy was negative. The patient was also diagnosed with prostate cancer and underwent prostatectomy. He never require any adjuvant treatment. In December 2014, he was found to have significant iron deficiency anemia and received intravenous iron infusion INTERVAL HISTORY: Paul [name] 73 y.o. male returns for further followup. He complained of fatigue. He has significant hemorrhoidal bleeding on a regular basis. Recently I give him iron infusion and asked him to stop oral iron supplement. The patient denies any recent signs or symptoms of bleeding such as spontaneous epistaxis, hematuria or hemoptysis.  I have reviewed the past medical history, past surgical history, social history and family history with the patient and they are unchanged from previous note.  ALLERGIES:  is allergic to sulfonamide derivatives.  MEDICATIONS:  Current Outpatient Prescriptions  Medication Sig Dispense Refill  . AMLODIPINE BESYLATE PO Take 5 mg by mouth daily.      Marland Kitchen aspirin 81 MG tablet Take 81 mg by mouth daily.      Marland Kitchen atorvastatin (LIPITOR) 40 MG tablet Take 40 mg by mouth daily.      . Cholecalciferol (VITAMIN D3) 2000 UNITS TABS Take 2,000 Units by mouth daily.      . fish oil-omega-3 fatty acids 1000 MG capsule Take 2 g by mouth daily.      Marland Kitchen lisinopril (PRINIVIL,ZESTRIL) 20 MG tablet Take 20 mg by mouth daily.      . pantoprazole (PROTONIX) 40 MG tablet Take 1 tablet (40 mg total) by mouth daily.  30 tablet  3   No current facility-administered medications for this visit.     REVIEW OF SYSTEMS:   All other systems were reviewed with the patient and are  negative.  PHYSICAL EXAMINATION: ECOG PERFORMANCE STATUS: 1 - Symptomatic but completely ambulatory  Filed Vitals:   08/06/13 1518  BP: 119/59  Pulse: 82  Temp: 98 F (36.7 C)  Resp: 18   Filed Weights   08/06/13 1518  Weight: 203 lb 11.2 oz (92.398 kg)    GENERAL:alert, no distress and comfortable. He looks pale SKIN: skin color is pale, texture, turgor are normal, no rashes or significant lesions ABDOMEN:abdomen soft, non-tender and normal bowel sounds Musculoskeletal:no cyanosis of digits and no clubbing  NEURO: alert & oriented x 3 with fluent speech, no focal motor/sensory deficits  LABORATORY DATA:  I have reviewed the data as listed Results for orders placed in visit on 08/06/13 (from the past 48 hour(s))  CBC & DIFF AND RETIC     Status: Abnormal   Collection Time    08/06/13  2:44 PM      Result Value Range   WBC 7.6  4.0 - 10.3 10e3/uL   NEUT# 3.9  1.5 - 6.5 10e3/uL   HGB 9.9 (*) 13.0 - 17.1 g/dL   HCT 30.9 (*) 38.4 - 49.9 %   Platelets 441 (*) 140 - 400 10e3/uL   MCV 100.3 (*) 79.3 - 98.0 fL   MCH 32.1  27.2 - 33.4 pg   MCHC 32.0  32.0 - 36.0 g/dL   RBC 3.08 (*) 4.20 - 5.82 10e6/uL   RDW 13.1  11.0 - 14.6 %   lymph# 2.9  0.9 - 3.3  10e3/uL   MONO# 0.7  0.1 - 0.9 10e3/uL   Eosinophils Absolute 0.1  0.0 - 0.5 10e3/uL   Basophils Absolute 0.0  0.0 - 0.1 10e3/uL   NEUT% 51.5  39.0 - 75.0 %   LYMPH% 38.1  14.0 - 49.0 %   MONO% 8.7  0.0 - 14.0 %   EOS% 1.3  0.0 - 7.0 %   BASO% 0.4  0.0 - 2.0 %   nRBC 0  0 - 0 %   Retic % 2.82 (*) 0.80 - 1.80 %   Retic Ct Abs 86.86  34.80 - 93.90 10e3/uL   Immature Retic Fract 16.20 (*) 3.00 - 10.60 %  IRON AND TIBC [facility]     Status: Abnormal   Collection Time    08/06/13  2:44 PM      Result Value Range   Iron 29 (*) 42 - 163 ug/dL   TIBC 306  202 - 409 ug/dL   UIBC 277  117 - 376 ug/dL   %SAT 10 (*) 20 - 55 %  FERRITIN [facility]     Status: None   Collection Time    08/06/13  2:44 PM      Result Value Range   Ferritin  64  22 - 316 ng/ml    Lab Results  Component Value Date   WBC 7.6 08/06/2013   HGB 9.9* 08/06/2013   HCT 30.9* 08/06/2013   MCV 100.3* 08/06/2013   PLT 441* 08/06/2013   ASSESSMENT & PLAN:  #1 iron deficiency anemia and intermittent GI bleed from hemorrhoids The patient continued to have bleeding. Even though his ferritin level has improved a little bit, he needs repeat GI evaluation. Previously he tested positive for H. pylori infection but he was not prescribed triple therapy.  With his iron level still low and he is getting more anemic, especially with ongoing GI bleed, I recommend repeat GI evaluation as well as iron infusion.  We discussed some of the risks, benefits, and alternatives of intravenous iron infusions. The patient is symptomatic from anemia and the iron level is critically low. He tolerated oral iron supplement poorly and desires to achieved higher levels of iron faster for adequate hematopoesis. Some of the side-effects to be expected including risks of infusion reactions, phlebitis, headaches, nausea and fatigue.  The patient is willing to proceed. I will see him back in a month with repeat iron studies and blood work. #2 hemorrhoidal bleeding I recommend regular stool softener. I recommend GI evaluation to rule out cancer.  #3 thrombocytosis This is due to iron deficiency. We'll monitor carefully. All questions were answered. The patient knows to call the clinic with any problems, questions or concerns. No barriers to learning was detected.  I spent 25 minutes counseling the patient face to face. The total time spent in the appointment was 40 minutes and more than 50% was on counseling.     [facility], [place], MD 08/06/2013 4:17 PM

## 2013-08-09 ENCOUNTER — Other Ambulatory Visit: Payer: Medicare Other | Admitting: Lab

## 2013-08-11 ENCOUNTER — Ambulatory Visit (HOSPITAL_BASED_OUTPATIENT_CLINIC_OR_DEPARTMENT_OTHER): Payer: Medicare Other

## 2013-08-11 VITALS — BP 120/66 | HR 85 | Temp 97.9°F | Resp 18

## 2013-08-11 DIAGNOSIS — D509 Iron deficiency anemia, unspecified: Secondary | ICD-10-CM

## 2013-08-11 MED ORDER — SODIUM CHLORIDE 0.9 % IV SOLN
1020.0000 mg | Freq: Once | INTRAVENOUS | Status: AC
  Administered 2013-08-11: 1020 mg via INTRAVENOUS
  Filled 2013-08-11: qty 34

## 2013-08-11 NOTE — Patient Instructions (Signed)

## 2013-09-03 ENCOUNTER — Ambulatory Visit (HOSPITAL_BASED_OUTPATIENT_CLINIC_OR_DEPARTMENT_OTHER): Payer: Medicare Other | Admitting: Hematology and Oncology

## 2013-09-03 ENCOUNTER — Other Ambulatory Visit (HOSPITAL_BASED_OUTPATIENT_CLINIC_OR_DEPARTMENT_OTHER): Payer: Medicare Other

## 2013-09-03 VITALS — BP 139/69 | HR 84 | Temp 98.1°F | Resp 20 | Ht 71.0 in | Wt 203.9 lb

## 2013-09-03 DIAGNOSIS — D509 Iron deficiency anemia, unspecified: Secondary | ICD-10-CM

## 2013-09-03 DIAGNOSIS — D473 Essential (hemorrhagic) thrombocythemia: Secondary | ICD-10-CM

## 2013-09-03 LAB — CBC & DIFF AND RETIC
BASO%: 0.3 % (ref 0.0–2.0)
Basophils Absolute: 0 10*3/uL (ref 0.0–0.1)
EOS%: 1.4 % (ref 0.0–7.0)
Eosinophils Absolute: 0.1 10*3/uL (ref 0.0–0.5)
HEMATOCRIT: 40.9 % (ref 38.4–49.9)
HEMOGLOBIN: 13.2 g/dL (ref 13.0–17.1)
Immature Retic Fract: 8.1 % (ref 3.00–10.60)
LYMPH%: 32.5 % (ref 14.0–49.0)
MCH: 31.8 pg (ref 27.2–33.4)
MCHC: 32.3 g/dL (ref 32.0–36.0)
MCV: 98.6 fL — AB (ref 79.3–98.0)
MONO#: 0.9 10*3/uL (ref 0.1–0.9)
MONO%: 12.4 % (ref 0.0–14.0)
NEUT#: 3.8 10*3/uL (ref 1.5–6.5)
NEUT%: 53.4 % (ref 39.0–75.0)
Platelets: 349 10*3/uL (ref 140–400)
RBC: 4.15 10*6/uL — ABNORMAL LOW (ref 4.20–5.82)
RDW: 15.2 % — AB (ref 11.0–14.6)
Retic %: 1.26 % (ref 0.80–1.80)
Retic Ct Abs: 52.29 10*3/uL (ref 34.80–93.90)
WBC: 7 10*3/uL (ref 4.0–10.3)
lymph#: 2.3 10*3/uL (ref 0.9–3.3)

## 2013-09-03 LAB — FERRITIN CHCC: FERRITIN: 204 ng/mL (ref 22–316)

## 2013-09-03 NOTE — Progress Notes (Signed)
[place] OFFICE PROGRESS NOTE  [name], MD DIAGNOSIS:  Iron deficiency anemia and thrombocytosis, resolved with iron infusion  SUMMARY OF HEMATOLOGIC HISTORY: This is a pleasant gentleman who has extensive workup in 2010 for anemia. He had EGD colonoscopy and capsule endoscopy that came back negative. Bone marrow aspirate and biopsy was negative. The patient was also diagnosed with prostate cancer and underwent prostatectomy. He never require any adjuvant treatment. In December 2014, he was found to have significant iron deficiency anemia and received intravenous iron infusion In January 2015, repeat EGD and colonoscopy revealed internal hemorrhoids INTERVAL HISTORY: Paul [name] 73 y.o. male returns for further followup. He continued to take stool softener and denies any further hemorrhoidal bleeding. He has significant improvement of energy level since the iron infusion. The patient denies any recent signs or symptoms of bleeding such as spontaneous epistaxis, hematuria or hematochezia. I have reviewed the past medical history, past surgical history, social history and family history with the patient and they are unchanged from previous note.  ALLERGIES:  is allergic to sulfonamide derivatives.  MEDICATIONS:  Current Outpatient Prescriptions  Medication Sig Dispense Refill  . AMLODIPINE BESYLATE PO Take 5 mg by mouth daily.      Marland Kitchen aspirin 81 MG tablet Take 81 mg by mouth daily.      Marland Kitchen atorvastatin (LIPITOR) 40 MG tablet Take 40 mg by mouth daily.      . Cholecalciferol (VITAMIN D3) 2000 UNITS TABS Take 2,000 Units by mouth daily.      . fish oil-omega-3 fatty acids 1000 MG capsule Take 2 g by mouth daily.      . folic acid (FOLVITE) 093 MCG tablet Take 400 mcg by mouth daily.      Marland Kitchen lisinopril (PRINIVIL,ZESTRIL) 20 MG tablet Take 20 mg by mouth daily.      . pantoprazole (PROTONIX) 40 MG tablet Take 1 tablet (40 mg total) by mouth daily.  30 tablet  3   . vitamin B-12 (CYANOCOBALAMIN) 1000 MCG tablet Take 1,000 mcg by mouth daily.       No current facility-administered medications for this visit.     REVIEW OF SYSTEMS:   Constitutional: Denies fevers, chills or night sweats Eyes: Denies blurriness of vision Ears, nose, mouth, throat, and face: Denies mucositis or sore throat Respiratory: Denies cough, dyspnea or wheezes Cardiovascular: Denies palpitation, chest discomfort or lower extremity swelling Gastrointestinal:  Denies nausea, heartburn or change in bowel habits Skin: Denies abnormal skin rashes Lymphatics: Denies new lymphadenopathy or easy bruising Neurological:Denies numbness, tingling or new weaknesses Behavioral/Psych: Mood is stable, no new changes  All other systems were reviewed with the patient and are negative.  PHYSICAL EXAMINATION: ECOG PERFORMANCE STATUS: 0 - Asymptomatic  Filed Vitals:   09/03/13 1149  BP: 139/69  Pulse: 84  Temp: 98.1 F (36.7 C)  Resp: 20   Filed Weights   09/03/13 1149  Weight: 203 lb 14.4 oz (92.488 kg)    GENERAL:alert, no distress and comfortable Musculoskeletal:no cyanosis of digits and no clubbing  NEURO: alert & oriented x 3 with fluent speech, no focal motor/sensory deficits  LABORATORY DATA:  I have reviewed the data as listed Results for orders placed in visit on 09/03/13 (from the past 48 hour(s))  CBC & DIFF AND RETIC     Status: Abnormal   Collection Time    09/03/13 11:33 AM      Result Value Range   WBC 7.0  4.0 - 10.3 10e3/uL  NEUT# 3.8  1.5 - 6.5 10e3/uL   HGB 13.2  13.0 - 17.1 g/dL   HCT 40.9  38.4 - 49.9 %   Platelets 349  140 - 400 10e3/uL   MCV 98.6 (*) 79.3 - 98.0 fL   MCH 31.8  27.2 - 33.4 pg   MCHC 32.3  32.0 - 36.0 g/dL   RBC 4.15 (*) 4.20 - 5.82 10e6/uL   RDW 15.2 (*) 11.0 - 14.6 %   lymph# 2.3  0.9 - 3.3 10e3/uL   MONO# 0.9  0.1 - 0.9 10e3/uL   Eosinophils Absolute 0.1  0.0 - 0.5 10e3/uL   Basophils Absolute 0.0  0.0 - 0.1 10e3/uL   NEUT%  53.4  39.0 - 75.0 %   LYMPH% 32.5  14.0 - 49.0 %   MONO% 12.4  0.0 - 14.0 %   EOS% 1.4  0.0 - 7.0 %   BASO% 0.3  0.0 - 2.0 %   Retic % 1.26  0.80 - 1.80 %   Retic Ct Abs 52.29  34.80 - 93.90 10e3/uL   Immature Retic Fract 8.10  3.00 - 10.60 %    Lab Results  Component Value Date   WBC 7.0 09/03/2013   HGB 13.2 09/03/2013   HCT 40.9 09/03/2013   MCV 98.6* 09/03/2013   PLT 349 09/03/2013   ASSESSMENT & PLAN:  #1 history of iron deficiency anemia #2 hemorrhoidal bleeding His iron store has been replenished. The patient is no longer anemic. I gave him a standing order to have his iron tests and CBC to be check with his primary care physician. I recommend he continue stool softener for hemorrhoidal bleeding. #3 history of thrombocytosis That was due to iron deficiency. It has resolved.  I have not make a return appointment for the patient to come back. All questions were answered. The patient knows to call the clinic with any problems, questions or concerns. No barriers to learning was detected.  I spent 15 minutes counseling the patient face to face. The total time spent in the appointment was 20 minutes and more than 50% was on counseling.     [place], [place], MD 09/03/2013 12:11 PM

## 2013-10-04 ENCOUNTER — Other Ambulatory Visit: Payer: Medicare Other | Admitting: Lab

## 2013-10-04 ENCOUNTER — Ambulatory Visit: Payer: Medicare Other

## 2013-10-25 ENCOUNTER — Other Ambulatory Visit: Payer: Self-pay | Admitting: *Deleted

## 2013-10-25 MED ORDER — ATORVASTATIN CALCIUM 40 MG PO TABS: 40.0000 mg | ORAL_TABLET | Freq: Every day | ORAL | Status: DC

## 2013-10-25 MED ORDER — LISINOPRIL 20 MG PO TABS: 20.0000 mg | ORAL_TABLET | Freq: Every day | ORAL | Status: DC

## 2013-11-23 ENCOUNTER — Encounter: Payer: Self-pay | Admitting: Family Medicine

## 2013-11-23 ENCOUNTER — Telehealth: Payer: Self-pay | Admitting: Hematology and Oncology

## 2013-11-23 ENCOUNTER — Other Ambulatory Visit: Payer: Self-pay | Admitting: Hematology and Oncology

## 2013-11-23 ENCOUNTER — Ambulatory Visit (INDEPENDENT_AMBULATORY_CARE_PROVIDER_SITE_OTHER): Payer: Medicare Other | Admitting: Family Medicine

## 2013-11-23 ENCOUNTER — Ambulatory Visit (INDEPENDENT_AMBULATORY_CARE_PROVIDER_SITE_OTHER): Payer: Medicare Other

## 2013-11-23 VITALS — BP 130/60 | HR 98 | Temp 97.8°F | Ht 71.0 in | Wt 206.0 lb

## 2013-11-23 DIAGNOSIS — D509 Iron deficiency anemia, unspecified: Secondary | ICD-10-CM

## 2013-11-23 DIAGNOSIS — C61 Malignant neoplasm of prostate: Secondary | ICD-10-CM

## 2013-11-23 DIAGNOSIS — M549 Dorsalgia, unspecified: Secondary | ICD-10-CM

## 2013-11-23 DIAGNOSIS — Z23 Encounter for immunization: Secondary | ICD-10-CM | POA: Insufficient documentation

## 2013-11-23 DIAGNOSIS — K219 Gastro-esophageal reflux disease without esophagitis: Secondary | ICD-10-CM

## 2013-11-23 DIAGNOSIS — E663 Overweight: Secondary | ICD-10-CM

## 2013-11-23 DIAGNOSIS — E785 Hyperlipidemia, unspecified: Secondary | ICD-10-CM

## 2013-11-23 DIAGNOSIS — R635 Abnormal weight gain: Secondary | ICD-10-CM

## 2013-11-23 DIAGNOSIS — I10 Essential (primary) hypertension: Secondary | ICD-10-CM

## 2013-11-23 LAB — POCT CBC
Granulocyte percent: 73.8 %G (ref 37–80)
HCT, POC: 25.3 % — AB (ref 43.5–53.7)
Hemoglobin: 7.8 g/dL — AB (ref 14.1–18.1)
Lymph, poc: 1.4 (ref 0.6–3.4)
MCH, POC: 26.4 pg — AB (ref 27–31.2)
MCHC: 30.8 g/dL — AB (ref 31.8–35.4)
MCV: 85.7 fL (ref 80–97)
MPV: 6.7 fL (ref 0–99.8)
POC Granulocyte: 4.8 (ref 2–6.9)
POC LYMPH PERCENT: 20.9 %L (ref 10–50)
Platelet Count, POC: 551 10*3/uL — AB (ref 142–424)
RBC: 3 M/uL — AB (ref 4.69–6.13)
RDW, POC: 18.2 %
WBC: 6.5 10*3/uL (ref 4.6–10.2)

## 2013-11-23 MED ORDER — AMLODIPINE BESYLATE 5 MG PO TABS: 5.0000 mg | ORAL_TABLET | Freq: Every day | ORAL | Status: DC

## 2013-11-23 MED ORDER — LISINOPRIL 20 MG PO TABS: 20.0000 mg | ORAL_TABLET | Freq: Every day | ORAL | Status: DC

## 2013-11-23 MED ORDER — PANTOPRAZOLE SODIUM 40 MG PO TBEC: 40.0000 mg | DELAYED_RELEASE_TABLET | Freq: Every day | ORAL | Status: DC

## 2013-11-23 MED ORDER — ATORVASTATIN CALCIUM 40 MG PO TABS: 40.0000 mg | ORAL_TABLET | Freq: Every day | ORAL | Status: DC

## 2013-11-23 NOTE — Telephone Encounter (Signed)
Talked to pt's wife and gave her appt for tomorrow, called Michelle left VM for PRBC and IV Iron

## 2013-11-23 NOTE — Progress Notes (Signed)
TOLERATED TDAP INJECTION WITHOUTDIFFICULTY

## 2013-11-23 NOTE — Progress Notes (Signed)
Patient ID: Paul [name], male   DOB: 04/30/1941, 73 y.o.   MRN: [id number] SUBJECTIVE: CC: Chief Complaint  Patient presents with  . Follow-up    4 MONTH FOLLOW UP CHRONIC PROBLEMS .STATES DR [place] LEVEL DRAWN SOME DAYS WHEN HE GETS UP HE IS DIZZY     HPI:  Patient is here for follow up of hyperlipidemia/HTN/iron def anemia: denies Headache;denies Chest Pain;denies weakness;denies Shortness of Breath and orthopnea;denies Visual changes;denies palpitations;denies cough;denies pedal edema;denies symptoms of TIA or stroke;deniesClaudication symptoms. admits to Compliance with medications; denies Problems with medications. Gets dizzy when he gets up suddenly.then it goes away.   Past Medical History  Diagnosis Date  . Hypertension   . Hyperlipidemia   . Iron deficiency anemia     presumped malabsorption; neg EGD/ capsule endos/colosopcy  . Iron deficiency anemia, unspecified 10/16/2012  . 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 Kidney stones    Past Surgical History  Procedure Laterality Date  . Robtic assisted laparoscopic      radical prostatectomy and bilateral pelvic lymphadenectomy   History   Social History  . Marital Status: Married    Spouse Name: N/A    Number of Children: N/A  . Years of Education: N/A   Occupational History  . Not on file.   Social History Main Topics  . Smoking status: Former Smoker    Quit date: 09/18/1981  . Smokeless tobacco: Not on file  . Alcohol Use: No  . Drug Use: No  . Sexual Activity: Not on file   Other Topics Concern  . Not on file   Social History Narrative  . No narrative on file   Family History  Problem Relation Age of Onset  . Heart failure Mother   . Diabetes type II Mother    Current Outpatient Prescriptions on File Prior to Visit  Medication Sig Dispense Refill  . aspirin 81 MG  tablet Take 81 mg by mouth daily.      . Cholecalciferol (VITAMIN D3) 2000 UNITS TABS Take 2,000 Units by mouth daily.      . vitamin B-12 (CYANOCOBALAMIN) 1000 MCG tablet Take 1,000 mcg by mouth daily.      . fish oil-omega-3 fatty acids 1000 MG capsule Take 2 g by mouth daily.      . folic acid (FOLVITE) 179 MCG tablet Take 400 mcg by mouth daily.       No current facility-administered medications on file prior to visit.   Allergies  Allergen Reactions  . Sulfonamide Derivatives     REACTION: rash   Immunization History  Administered Date(s) Administered  . Influenza,inj,Quad PF,36+ Mos 05/10/2013  . Pneumococcal Conjugate-13 07/23/2013  . Tdap 11/23/2013   Prior to Admission medications   Medication Sig Start Date End Date Taking? Authorizing Provider  AMLODIPINE BESYLATE PO Take 5 mg by mouth daily.   Yes Historical Provider, MD  aspirin 81 MG tablet Take 81 mg by mouth daily.   Yes Historical Provider, MD  atorvastatin (LIPITOR) 40 MG tablet Take 1 tablet (40 mg total) by mouth daily. 10/25/13  Yes Vernie Shanks, MD  Cholecalciferol (VITAMIN D3) 2000 UNITS TABS Take 2,000 Units by mouth daily.   Yes Historical Provider, MD  lisinopril (PRINIVIL,ZESTRIL) 20 MG tablet Take 1 tablet (20 mg total) by mouth daily. 10/25/13  Yes Vernie Shanks, MD  pantoprazole ([place])  40 MG tablet Take 1 tablet (40 mg total) by mouth daily. 03/23/13  Yes Vernie Shanks, MD  vitamin B-12 (CYANOCOBALAMIN) 1000 MCG tablet Take 1,000 mcg by mouth daily.   Yes Historical Provider, MD  fish oil-omega-3 fatty acids 1000 MG capsule Take 2 g by mouth daily.    Historical Provider, MD  folic acid (FOLVITE) 025 MCG tablet Take 400 mcg by mouth daily.    Historical Provider, MD     ROS: As above in the HPI. All other systems are stable or negative.  OBJECTIVE: APPEARANCE:  Patient in no acute distress.The patient appeared well nourished and normally developed. Acyanotic. Waist: VITAL SIGNS:BP 130/60   Pulse 98  Temp(Src) 97.8 F (36.6 C) (Oral)  Ht 5' 11"  (1.803 m)  Wt 206 lb (93.441 kg)  BMI 28.74 kg/m2  WM  SKIN: warm and  Dry without overt rashes, tattoos and scars  HEAD and Neck: without JVD, Head and scalp: normal Eyes:No scleral icterus. Fundi normal, eye movements normal. Ears: Auricle normal, canal normal, Tympanic membranes normal, insufflation normal. Nose: normal Throat: normal Neck & thyroid: normal  CHEST & LUNGS: Chest wall: normal Lungs: Clear  CVS: Reveals the PMI to be normally located. Regular rhythm, First and Second Heart sounds are normal,  absence of murmurs, rubs or gallops. Peripheral vasculature: Radial pulses: normal Dorsal pedis pulses: normal Posterior pulses: normal  ABDOMEN:  Appearance: normal Benign, no organomegaly, no masses, no Abdominal Aortic enlargement. No Guarding , no rebound. No Bruits. Bowel sounds: normal  RECTAL: N/A GU: N/A  EXTREMETIES: nonedematous.  MUSCULOSKELETAL:  Spine: normal Joints: intact  NE  ASSESSMENT:  HYPERLIPIDEMIA-MIXED - Plan: CMP14+EGFR, Lipid panel, atorvastatin (LIPITOR) 40 MG tablet, Ferritin  Need for Tdap vaccination - Plan: Tdap vaccine greater than or equal to 7yo IM, Ferritin  Overweight - Plan: Ferritin  Iron deficiency anemia, unspecified - Plan: POCT CBC, Ferritin, POCT CBC, CANCELED: Ferritin  Essential hypertension, benign - Plan: CMP14+EGFR, amLODipine (NORVASC) 5 MG tablet, lisinopril (PRINIVIL,ZESTRIL) 20 MG tablet, Ferritin  Adenocarcinoma of prostate - Plan: Ferritin  Back pain - Plan: DG Lumbar Spine Complete, Ferritin  GERD (gastroesophageal reflux disease) - Plan: pantoprazole (PROTONIX) 40 MG tablet, Ferritin  PLAN: Slow to arise. Fall precautions.  Orders Placed This Encounter  Procedures  . DG Lumbar Spine Complete    Order Specific Question:  Reason for Exam (SYMPTOM  OR DIAGNOSIS REQUIRED)    Answer:  back pain, h/o prostate cancer    Order Specific  Question:  Preferred imaging location?    Answer:  Internal  . Tdap vaccine greater than or equal to 7yo IM  . CMP14+EGFR  . Lipid panel  . Ferritin  . POCT CBC    Every 4 to 6 months    Standing Status: Standing     Number of Occurrences: 4     Standing Expiration Date: 11/24/2014   Meds ordered this encounter  Medications  . amLODipine (NORVASC) 5 MG tablet    Sig: Take 1 tablet (5 mg total) by mouth daily.    Dispense:  90 tablet    Refill:  3  . atorvastatin (LIPITOR) 40 MG tablet    Sig: Take 1 tablet (40 mg total) by mouth daily.    Dispense:  90 tablet    Refill:  3  . lisinopril (PRINIVIL,ZESTRIL) 20 MG tablet    Sig: Take 1 tablet (20 mg total) by mouth daily.    Dispense:  90 tablet    Refill:  3  . pantoprazole (PROTONIX) 40 MG tablet    Sig: Take 1 tablet (40 mg total) by mouth daily.    Dispense:  90 tablet    Refill:  3   Medications Discontinued During This Encounter  Medication Reason  . AMLODIPINE BESYLATE PO Reorder  . atorvastatin (LIPITOR) 40 MG tablet Reorder  . lisinopril (PRINIVIL,ZESTRIL) 20 MG tablet Reorder  . pantoprazole (PROTONIX) 40 MG tablet Reorder   Return in about 3 months (around 02/22/2014) for Recheck medical problems.  Jermain Curt P. Jacelyn Grip, M.D.  Results for orders placed in visit on 11/23/13  POCT CBC      Result Value Ref Range   WBC 6.5  4.6 - 10.2 K/uL   Lymph, poc 1.4  0.6 - 3.4   POC LYMPH PERCENT 20.9  10 - 50 %L   POC Granulocyte 4.8  2 - 6.9   Granulocyte percent 73.8  37 - 80 %G   RBC 3.0 (*) 4.69 - 6.13 M/uL   Hemoglobin 7.8 (*) 14.1 - 18.1 g/dL   HCT, POC 25.3 (*) 43.5 - 53.7 %   MCV 85.7  80 - 97 fL   MCH, POC 26.4 (*) 27 - 31.2 pg   MCHC 30.8 (*) 31.8 - 35.4 g/dL   RDW, POC 18.2     Platelet Count, POC 551.0 (*) 142 - 424 K/uL   MPV 6.7  0 - 99.8 fL    Patient called at home to come back in now to recheck the CBC  Performed a rectal exam: heme negative brown stool.prostate moderately enlarged.  Contacted Dr  Wickstrom Lark. Advised patient to RTC to see her tomorrow at 9 am at the Hematology clinic.  Advised patient and his wife. Also, if any acute sudden change in his  Status, eg chest pain, SOB, or acute rectal bleeding , he will need to call 911 or proceed to the ED as appropriate. They agreed.   [place] P. Jacelyn Grip, M.D.

## 2013-11-23 NOTE — Patient Instructions (Addendum)
Tetanus, Diphtheria, Pertussis (Tdap) Vaccine What You Need to Know WHY GET VACCINATED? Tetanus, diphtheria and pertussis can be very serious diseases, even for adolescents and adults. Tdap vaccine can protect us from these diseases. TETANUS (Lockjaw) causes painful muscle tightening and stiffness, usually all over the body.  It can lead to tightening of muscles in the head and neck so you can't open your mouth, swallow, or sometimes even breathe. Tetanus kills about 1 out of 5 people who are infected. DIPHTHERIA can cause a thick coating to form in the back of the throat.  It can lead to breathing problems, paralysis, heart failure, and death. PERTUSSIS (Whooping Cough) causes severe coughing spells, which can cause difficulty breathing, vomiting and disturbed sleep.  It can also lead to weight loss, incontinence, and rib fractures. Up to 2 in 100 adolescents and 5 in 100 adults with pertussis are hospitalized or have complications, which could include pneumonia and death. These diseases are caused by bacteria. Diphtheria and pertussis are spread from person to person through coughing or sneezing. Tetanus enters the body through cuts, scratches, or wounds. Before vaccines, the [place] saw as many as 200,000 cases a year of diphtheria and pertussis, and hundreds of cases of tetanus. Since vaccination began, tetanus and diphtheria have dropped by about 99% and pertussis by about 80%. TDAP VACCINE Tdap vaccine can protect adolescents and adults from tetanus, diphtheria, and pertussis. One dose of Tdap is routinely given at age 11 or 12. People who did not get Tdap at that age should get it as soon as possible. Tdap is especially important for health care professionals and anyone having close contact with a baby younger than 12 months. Pregnant women should get a dose of Tdap during every pregnancy, to protect the newborn from pertussis. Infants are most at risk for severe, life-threatening  complications from pertussis. A similar vaccine, called Td, protects from tetanus and diphtheria, but not pertussis. A Td booster should be given every 10 years. Tdap may be given as one of these boosters if you have not already gotten a dose. Tdap may also be given after a severe cut or burn to prevent tetanus infection. Your doctor can give you more information. Tdap may safely be given at the same time as other vaccines. SOME PEOPLE SHOULD NOT GET THIS VACCINE  If you ever had a life-threatening allergic reaction after a dose of any tetanus, diphtheria, or pertussis containing vaccine, OR if you have a severe allergy to any part of this vaccine, you should not get Tdap. Tell your doctor if you have any severe allergies.  If you had a coma, or long or multiple seizures within 7 days after a childhood dose of DTP or DTaP, you should not get Tdap, unless a cause other than the vaccine was found. You can still get Td.  Talk to your doctor if you:  have epilepsy or another nervous system problem,  had severe pain or swelling after any vaccine containing diphtheria, tetanus or pertussis,  ever had Guillain-Barr Syndrome (GBS),  aren't feeling well on the day the shot is scheduled. RISKS OF A VACCINE REACTION With any medicine, including vaccines, there is a chance of side effects. These are usually mild and go away on their own, but serious reactions are also possible. Brief fainting spells can follow a vaccination, leading to injuries from falling. Sitting or lying down for about 15 minutes can help prevent these. Tell your doctor if you feel dizzy or light-headed, or   have vision changes or ringing in the ears. Mild problems following Tdap (Did not interfere with activities)  Pain where the shot was given (about 3 in 4 adolescents or 2 in 3 adults)  Redness or swelling where the shot was given (about 1 person in 5)  Mild fever of at least 100.87F (up to about 1 in 25 adolescents or 1 in  100 adults)  Headache (about 3 or 4 people in 10)  Tiredness (about 1 person in 3 or 4)  Nausea, vomiting, diarrhea, stomach ache (up to 1 in 4 adolescents or 1 in 10 adults)  Chills, body aches, sore joints, rash, swollen glands (uncommon) Moderate problems following Tdap (Interfered with activities, but did not require medical attention)  Pain where the shot was given (about 1 in 5 adolescents or 1 in 100 adults)  Redness or swelling where the shot was given (up to about 1 in 16 adolescents or 1 in 25 adults)  Fever over 102F (about 1 in 100 adolescents or 1 in 250 adults)  Headache (about 3 in 20 adolescents or 1 in 10 adults)  Nausea, vomiting, diarrhea, stomach ache (up to 1 or 3 people in 100)  Swelling of the entire arm where the shot was given (up to about 3 in 100). Severe problems following Tdap (Unable to perform usual activities, required medical attention)  Swelling, severe pain, bleeding and redness in the arm where the shot was given (rare). A severe allergic reaction could occur after any vaccine (estimated less than 1 in a million doses). WHAT IF THERE IS A SERIOUS REACTION? What should I look for?  Look for anything that concerns you, such as signs of a severe allergic reaction, very high fever, or behavior changes. Signs of a severe allergic reaction can include hives, swelling of the face and throat, difficulty breathing, a fast heartbeat, dizziness, and weakness. These would start a few minutes to a few hours after the vaccination. What should I do?  If you think it is a severe allergic reaction or other emergency that can't wait, call 9-1-1 or get the person to the nearest hospital. Otherwise, call your doctor.  Afterward, the reaction should be reported to the "Vaccine Adverse Event Reporting System" (VAERS). Your doctor might file this report, or you can do it yourself through the VAERS web site at www.vaers.SamedayNews.es, or by calling 825-549-2713. VAERS is  only for reporting reactions. They do not give medical advice.  THE NATIONAL VACCINE INJURY COMPENSATION PROGRAM The National Vaccine Injury Compensation Program (VICP) is a federal program that was created to compensate people who may have been injured by certain vaccines. Persons who believe they may have been injured by a vaccine can learn about the program and about filing a claim by calling 6151000884 or visiting the [facility] website at GoldCloset.com.ee. HOW CAN I LEARN MORE?  Ask your doctor.  Call your local or state health department.  Contact the Centers for Disease Control and Prevention (CDC):  Call 931-838-2142 or visit CDC's website at http://hunter.com/. CDC Tdap Vaccine VIS (12/12/11) Document Released: 01/21/2012 Document Revised: 11/16/2012 Document Reviewed: 11/11/2012 [facility] Patient Information 2014 [place], [place].   DASH Diet The DASH diet stands for "Dietary Approaches to Stop Hypertension." It is a healthy eating plan that has been shown to reduce high blood pressure (hypertension) in as little as 14 days, while also possibly providing other significant health benefits. These other health benefits include reducing the risk of breast cancer after menopause and reducing the risk  of type 2 diabetes, heart disease, colon cancer, and stroke. Health benefits also include weight loss and slowing kidney failure in patients with chronic kidney disease.  DIET GUIDELINES  Limit salt (sodium). Your diet should contain less than 1500 mg of sodium daily.  Limit refined or processed carbohydrates. Your diet should include mostly whole grains. Desserts and added sugars should be used sparingly.  Include small amounts of heart-healthy fats. These types of fats include nuts, oils, and tub margarine. Limit saturated and trans fats. These fats have been shown to be harmful in the body. CHOOSING FOODS  The following food groups are based on a 2000 calorie diet. See  your Registered Dietitian for individual calorie needs. Grains and Grain Products (6 to 8 servings daily)  Eat More Often: Whole-wheat bread, brown rice, whole-grain or wheat pasta, quinoa, popcorn without added fat or salt (air popped).  Eat Less Often: White bread, white pasta, white rice, cornbread. Vegetables (4 to 5 servings daily)  Eat More Often: Fresh, frozen, and canned vegetables. Vegetables may be raw, steamed, roasted, or grilled with a minimal amount of fat.  Eat Less Often/Avoid: Creamed or fried vegetables. Vegetables in a cheese sauce. Fruit (4 to 5 servings daily)  Eat More Often: All fresh, canned (in natural juice), or frozen fruits. Dried fruits without added sugar. One hundred percent fruit juice ( cup [237 mL] daily).  Eat Less Often: Dried fruits with added sugar. Canned fruit in light or heavy syrup. [employer], Fish, and Poultry (2 servings or less daily. One serving is 3 to 4 oz [85-114 g]).  Eat More Often: Ninety percent or leaner ground beef, tenderloin, sirloin. Round cuts of beef, chicken breast, [place] breast. All fish. Grill, bake, or broil your meat. Nothing should be fried.  Eat Less Often/Avoid: Fatty cuts of meat, [place], or chicken leg, thigh, or wing. Fried cuts of meat or fish. Dairy (2 to 3 servings)  Eat More Often: Low-fat or fat-free milk, low-fat plain or light yogurt, reduced-fat or part-skim cheese.  Eat Less Often/Avoid: Milk (whole, 2%).Whole milk yogurt. Full-fat cheeses. Nuts, Seeds, and Legumes (4 to 5 servings per week)  Eat More Often: All without added salt.  Eat Less Often/Avoid: Salted nuts and seeds, canned beans with added salt. Fats and Sweets (limited)  Eat More Often: Vegetable oils, tub margarines without trans fats, sugar-free gelatin. Mayonnaise and salad dressings.  Eat Less Often/Avoid: Coconut oils, palm oils, butter, stick margarine, cream, half and half, cookies, candy, pie. FOR MORE INFORMATION The Dash Diet  Eating Plan: www.dashdiet.org Document Released: 07/11/2011 Document Revised: 10/14/2011 Document Reviewed: 07/11/2011 [facility] Patient Information 2014 [place], [place].   Back Exercises Back exercises help treat and prevent back injuries. The goal of back exercises is to increase the strength of your abdominal and back muscles and the flexibility of your back. These exercises should be started when you no longer have back pain. Back exercises include:  Pelvic Tilt. Lie on your back with your knees bent. Tilt your pelvis until the lower part of your back is against the floor. Hold this position 5 to 10 sec and repeat 5 to 10 times.  Knee to Chest. Pull first 1 knee up against your chest and hold for 20 to 30 seconds, repeat this with the other knee, and then both knees. This may be done with the other leg straight or bent, whichever feels better.  Sit-Ups or Curl-Ups. Bend your knees 90 degrees. Start with tilting your pelvis, and do  a partial, slow sit-up, lifting your trunk only 30 to 45 degrees off the floor. Take at least 2 to 3 seconds for each sit-up. Do not do sit-ups with your knees out straight. If partial sit-ups are difficult, simply do the above but with only tightening your abdominal muscles and holding it as directed.  Hip-Lift. Lie on your back with your knees flexed 90 degrees. Push down with your feet and shoulders as you raise your hips a couple inches off the floor; hold for 10 seconds, repeat 5 to 10 times.  Back arches. Lie on your stomach, propping yourself up on bent elbows. Slowly press on your hands, causing an arch in your low back. Repeat 3 to 5 times. Any initial stiffness and discomfort should lessen with repetition over time.  Shoulder-Lifts. Lie face down with arms beside your body. Keep hips and torso pressed to floor as you slowly lift your head and shoulders off the floor. Do not overdo your exercises, especially in the beginning. Exercises may cause you some mild  back discomfort which lasts for a few minutes; however, if the pain is more severe, or lasts for more than 15 minutes, do not continue exercises until you see your caregiver. Improvement with exercise therapy for back problems is slow.  See your caregivers for assistance with developing a proper back exercise program. Document Released: 08/29/2004 Document Revised: 10/14/2011 Document Reviewed: 05/23/2011 [facility] Patient Information 2014 [place].

## 2013-11-24 ENCOUNTER — Telehealth: Payer: Self-pay | Admitting: Hematology and Oncology

## 2013-11-24 ENCOUNTER — Encounter: Payer: Self-pay | Admitting: Hematology and Oncology

## 2013-11-24 ENCOUNTER — Ambulatory Visit (HOSPITAL_BASED_OUTPATIENT_CLINIC_OR_DEPARTMENT_OTHER): Payer: Medicare Other

## 2013-11-24 ENCOUNTER — Ambulatory Visit (HOSPITAL_BASED_OUTPATIENT_CLINIC_OR_DEPARTMENT_OTHER): Payer: Medicare Other | Admitting: Hematology and Oncology

## 2013-11-24 ENCOUNTER — Ambulatory Visit (HOSPITAL_COMMUNITY)
Admission: RE | Admit: 2013-11-24 | Discharge: 2013-11-24 | Disposition: A | Discharge status: Home / Self Care | Payer: Medicare Other | Source: Ambulatory Visit | Attending: Hematology and Oncology | Admitting: Hematology and Oncology

## 2013-11-24 ENCOUNTER — Other Ambulatory Visit (HOSPITAL_BASED_OUTPATIENT_CLINIC_OR_DEPARTMENT_OTHER): Payer: Medicare Other

## 2013-11-24 ENCOUNTER — Telehealth: Payer: Self-pay | Admitting: *Deleted

## 2013-11-24 VITALS — BP 144/80 | HR 74 | Temp 97.5°F | Resp 18

## 2013-11-24 VITALS — BP 138/61 | HR 89 | Temp 97.6°F | Resp 20 | Ht 71.0 in | Wt 208.1 lb

## 2013-11-24 DIAGNOSIS — K644 Residual hemorrhoidal skin tags: Secondary | ICD-10-CM

## 2013-11-24 DIAGNOSIS — K649 Unspecified hemorrhoids: Secondary | ICD-10-CM

## 2013-11-24 DIAGNOSIS — D509 Iron deficiency anemia, unspecified: Secondary | ICD-10-CM

## 2013-11-24 DIAGNOSIS — D473 Essential (hemorrhagic) thrombocythemia: Secondary | ICD-10-CM | POA: Insufficient documentation

## 2013-11-24 DIAGNOSIS — D649 Anemia, unspecified: Secondary | ICD-10-CM | POA: Insufficient documentation

## 2013-11-24 LAB — CBC & DIFF AND RETIC
BASO%: 0.6 % (ref 0.0–2.0)
Basophils Absolute: 0 10*3/uL (ref 0.0–0.1)
EOS%: 2.9 % (ref 0.0–7.0)
Eosinophils Absolute: 0.2 10*3/uL (ref 0.0–0.5)
HCT: 24.7 % — ABNORMAL LOW (ref 38.4–49.9)
HGB: 7.7 g/dL — ABNORMAL LOW (ref 13.0–17.1)
LYMPH#: 1.7 10*3/uL (ref 0.9–3.3)
LYMPH%: 32.1 % (ref 14.0–49.0)
MCH: 26.9 pg — AB (ref 27.2–33.4)
MCHC: 31.2 g/dL — AB (ref 32.0–36.0)
MCV: 86.4 fL (ref 79.3–98.0)
MONO#: 0.6 10*3/uL (ref 0.1–0.9)
MONO%: 12.1 % (ref 0.0–14.0)
NEUT#: 2.7 10*3/uL (ref 1.5–6.5)
NEUT%: 52.3 % (ref 39.0–75.0)
Platelets: 444 10*3/uL — ABNORMAL HIGH (ref 140–400)
RBC: 2.86 10*6/uL — AB (ref 4.20–5.82)
RDW: 17.4 % — ABNORMAL HIGH (ref 11.0–14.6)
WBC: 5.2 10*3/uL (ref 4.0–10.3)

## 2013-11-24 LAB — CMP14+EGFR
ALT: 15 IU/L (ref 0–44)
AST: 19 IU/L (ref 0–40)
Albumin/Globulin Ratio: 1.9 (ref 1.1–2.5)
Albumin: 4.5 g/dL (ref 3.5–4.8)
Alkaline Phosphatase: 113 IU/L (ref 39–117)
BUN/Creatinine Ratio: 16 (ref 10–22)
BUN: 16 mg/dL (ref 8–27)
CO2: 24 mmol/L (ref 18–29)
Calcium: 9.4 mg/dL (ref 8.6–10.2)
Chloride: 101 mmol/L (ref 97–108)
Creatinine, Ser: 1 mg/dL (ref 0.76–1.27)
GFR calc Af Amer: 87 mL/min/{1.73_m2} (ref >59)
GFR calc non Af Amer: 75 mL/min/{1.73_m2} (ref >59)
Globulin, Total: 2.4 g/dL (ref 1.5–4.5)
Glucose: 111 mg/dL — ABNORMAL HIGH (ref 65–99)
Potassium: 4.7 mmol/L (ref 3.5–5.2)
Sodium: 140 mmol/L (ref 134–144)
Total Bilirubin: 0.4 mg/dL (ref 0.0–1.2)
Total Protein: 6.9 g/dL (ref 6.0–8.5)

## 2013-11-24 LAB — LIPID PANEL
Chol/HDL Ratio: 2.5 ratio units (ref 0.0–5.0)
Cholesterol, Total: 155 mg/dL (ref 100–199)
HDL: 62 mg/dL (ref >39)
LDL Calculated: 74 mg/dL (ref 0–99)
Triglycerides: 97 mg/dL (ref 0–149)
VLDL Cholesterol Cal: 19 mg/dL (ref 5–40)

## 2013-11-24 LAB — RETICULOCYTES (CHCC)
ABS RETIC: 57.8 10*3/uL (ref 19.0–186.0)
RBC.: 2.89 MIL/uL — ABNORMAL LOW (ref 4.22–5.81)
Retic Ct Pct: 2 % (ref 0.4–2.3)

## 2013-11-24 LAB — PREPARE RBC (CROSSMATCH)

## 2013-11-24 LAB — HOLD TUBE, BLOOD BANK

## 2013-11-24 LAB — FERRITIN: Ferritin: 8 ng/mL — ABNORMAL LOW (ref 30–400)

## 2013-11-24 MED ORDER — SODIUM CHLORIDE 0.9 % IV SOLN
250.0000 mL | Freq: Once | INTRAVENOUS | Status: AC
  Administered 2013-11-24: 250 mL via INTRAVENOUS

## 2013-11-24 NOTE — Patient Instructions (Signed)
Blood Transfusion Information WHAT IS A BLOOD TRANSFUSION? A transfusion is the replacement of blood or some of its parts. Blood is made up of multiple cells which provide different functions.  Red blood cells carry oxygen and are used for blood loss replacement.  White blood cells fight against infection.  Platelets control bleeding.  Plasma helps clot blood.  Other blood products are available for specialized needs, such as hemophilia or other clotting disorders. BEFORE THE TRANSFUSION  Who gives blood for transfusions?   You may be able to donate blood to be used at a later date on yourself (autologous donation).  Relatives can be asked to donate blood. This is generally not any safer than if you have received blood from a stranger. The same precautions are taken to ensure safety when a relative's blood is donated.  Healthy volunteers who are fully evaluated to make sure their blood is safe. This is blood bank blood. Transfusion therapy is the safest it has ever been in the practice of medicine. Before blood is taken from a donor, a complete history is taken to make sure that person has no history of diseases nor engages in risky social behavior (examples are intravenous drug use or sexual activity with multiple partners). The donor's travel history is screened to minimize risk of transmitting infections, such as malaria. The donated blood is tested for signs of infectious diseases, such as HIV and hepatitis. The blood is then tested to be sure it is compatible with you in order to minimize the chance of a transfusion reaction. If you or a relative donates blood, this is often done in anticipation of surgery and is not appropriate for emergency situations. It takes many days to process the donated blood. RISKS AND COMPLICATIONS Although transfusion therapy is very safe and saves many lives, the main dangers of transfusion include:   Getting an infectious disease.  Developing a  transfusion reaction. This is an allergic reaction to something in the blood you were given. Every precaution is taken to prevent this. The decision to have a blood transfusion has been considered carefully by your caregiver before blood is given. Blood is not given unless the benefits outweigh the risks. AFTER THE TRANSFUSION  Right after receiving a blood transfusion, you will usually feel much better and more energetic. This is especially true if your red blood cells have gotten low (anemic). The transfusion raises the level of the red blood cells which carry oxygen, and this usually causes an energy increase.  The nurse administering the transfusion will monitor you carefully for complications. HOME CARE INSTRUCTIONS  No special instructions are needed after a transfusion. You may find your energy is better. Speak with your caregiver about any limitations on activity for underlying diseases you may have. SEEK MEDICAL CARE IF:   Your condition is not improving after your transfusion.  You develop redness or irritation at the intravenous (IV) site. SEEK IMMEDIATE MEDICAL CARE IF:  Any of the following symptoms occur over the next 12 hours:  Shaking chills.  You have a temperature by mouth above 102 F (38.9 C), not controlled by medicine.  Chest, back, or muscle pain.  People around you feel you are not acting correctly or are confused.  Shortness of breath or difficulty breathing.  Dizziness and fainting.  You get a rash or develop hives.  You have a decrease in urine output.  Your urine turns a dark color or changes to pink, red, or brown. Any of the following   symptoms occur over the next 10 days:  You have a temperature by mouth above 102 F (38.9 C), not controlled by medicine.  Shortness of breath.  Weakness after normal activity.  The white part of the eye turns yellow (jaundice).  You have a decrease in the amount of urine or are urinating less often.  Your  urine turns a dark color or changes to pink, red, or brown. Document Released: 07/19/2000 Document Revised: 10/14/2011 Document Reviewed: 03/07/2008 [employer] Patient Information 2014 [employer].  

## 2013-11-24 NOTE — Progress Notes (Signed)
[name] OFFICE PROGRESS NOTE  [name], MD DIAGNOSIS:  Recurrent, severe iron deficiency anemia  SUMMARY OF HEMATOLOGIC HISTORY: This is a pleasant gentleman who has extensive workup in 2010 for anemia. He had EGD colonoscopy and capsule endoscopy that came back negative. Bone marrow aspirate and biopsy was negative. The patient was also diagnosed with prostate cancer and underwent prostatectomy. He never require any adjuvant treatment. In December 2014, he was found to have significant iron deficiency anemia and received intravenous iron infusion In January 2015, repeat EGD and colonoscopy revealed internal hemorrhoids INTERVAL HISTORY: Paul [name] 73 y.o. male returns for urgent evaluation. The patient was complaining of feeling tired. Repeat CBC in his primary care office yesterday showed hemoglobin of 7.8. Ferritin level has dropped from 204 on 09/03/2013 to 8 yesterday. According to the patient, he had one major hemorrhoidal bleeding with prolapse several weeks ago. He continued to have chronic intermittent hemorrhoidal bleeding recently. He complained of fatigue. Denies any chest pain, shortness of breath or dizziness. The patient denies any recent signs or symptoms of bleeding such as spontaneous epistaxis, hematuria or easy bruising.  I have reviewed the past medical history, past surgical history, social history and family history with the patient and they are unchanged from previous note.  ALLERGIES:  is allergic to sulfonamide derivatives.  MEDICATIONS:  Current Outpatient Prescriptions  Medication Sig Dispense Refill  . amLODipine (NORVASC) 5 MG tablet Take 1 tablet (5 mg total) by mouth daily.  90 tablet  3  . aspirin 81 MG tablet Take 81 mg by mouth daily.      Marland Kitchen atorvastatin (LIPITOR) 40 MG tablet Take 1 tablet (40 mg total) by mouth daily.  90 tablet  3  . Cholecalciferol (VITAMIN D3) 2000 UNITS TABS Take 2,000 Units by mouth daily.       . folic acid (FOLVITE) 361 MCG tablet Take 400 mcg by mouth daily.      Marland Kitchen lisinopril (PRINIVIL,ZESTRIL) 20 MG tablet Take 1 tablet (20 mg total) by mouth daily.  90 tablet  3  . pantoprazole (PROTONIX) 40 MG tablet Take 1 tablet (40 mg total) by mouth daily.  90 tablet  3  . vitamin B-12 (CYANOCOBALAMIN) 1000 MCG tablet Take 1,000 mcg by mouth daily.       No current facility-administered medications for this visit.     REVIEW OF SYSTEMS:   Constitutional: Denies fevers, chills or night sweats Eyes: Denies blurriness of vision Ears, nose, mouth, throat, and face: Denies mucositis or sore throat Respiratory: Denies cough, dyspnea or wheezes Cardiovascular: Denies palpitation, chest discomfort or lower extremity swelling Gastrointestinal:  Denies nausea, heartburn or change in bowel habits Skin: Denies abnormal skin rashes Lymphatics: Denies new lymphadenopathy or easy bruising Neurological:Denies numbness, tingling or new weaknesses Behavioral/Psych: Mood is stable, no new changes  All other systems were reviewed with the patient and are negative.  PHYSICAL EXAMINATION: ECOG PERFORMANCE STATUS: 1 - Symptomatic but completely ambulatory  Filed Vitals:   11/24/13 0906  BP: 138/61  Pulse: 89  Temp: 97.6 F (36.4 C)  Resp: 20   Filed Weights   11/24/13 0906  Weight: 208 lb 1.6 oz (94.394 kg)    GENERAL:alert, no distress and comfortable. He looks pale SKIN: skin color, texture, turgor are normal, no rashes or significant lesions EYES: normal, Conjunctiva are pale and non-injected, sclera clear OROPHARYNX:no exudate, no erythema and lips, buccal mucosa, and tongue normal  NECK: supple, thyroid normal size, non-tender, without nodularity  LYMPH:  no palpable lymphadenopathy in the cervical, axillary or inguinal LUNGS: clear to auscultation and percussion with normal breathing effort HEART: regular rate & rhythm and no murmurs and no lower extremity edema ABDOMEN:abdomen soft,  non-tender and normal bowel sounds Musculoskeletal:no cyanosis of digits and no clubbing  NEURO: alert & oriented x 3 with fluent speech, no focal motor/sensory deficits  LABORATORY DATA:  I have reviewed the data as listed Results for orders placed in visit on 11/24/13 (from the past 48 hour(s))  CBC & DIFF AND RETIC     Status: Abnormal   Collection Time    11/24/13  8:53 AM      Result Value Ref Range   WBC 5.2  4.0 - 10.3 10e3/uL   NEUT# 2.7  1.5 - 6.5 10e3/uL   HGB 7.7 (*) 13.0 - 17.1 g/dL   HCT 24.7 (*) 38.4 - 49.9 %   Platelets 444 (*) 140 - 400 10e3/uL   MCV 86.4  79.3 - 98.0 fL   MCH 26.9 (*) 27.2 - 33.4 pg   MCHC 31.2 (*) 32.0 - 36.0 g/dL   RBC 2.86 (*) 4.20 - 5.82 10e6/uL   RDW 17.4 (*) 11.0 - 14.6 %   lymph# 1.7  0.9 - 3.3 10e3/uL   MONO# 0.6  0.1 - 0.9 10e3/uL   Eosinophils Absolute 0.2  0.0 - 0.5 10e3/uL   Basophils Absolute 0.0  0.0 - 0.1 10e3/uL   NEUT% 52.3  39.0 - 75.0 %   LYMPH% 32.1  14.0 - 49.0 %   MONO% 12.1  0.0 - 14.0 %   EOS% 2.9  0.0 - 7.0 %   BASO% 0.6  0.0 - 2.0 %   Retic Comment Sent out for confirmation.    HOLD TUBE, BLOOD BANK     Status: None   Collection Time    11/24/13  8:53 AM      Result Value Ref Range   Hold Tube, Blood Bank Type and Crossmatch Added      Lab Results  Component Value Date   WBC 5.2 11/24/2013   HGB 7.7* 11/24/2013   HCT 24.7* 11/24/2013   MCV 86.4 11/24/2013   PLT 444* 11/24/2013    RADIOGRAPHIC STUDIES: I have personally reviewed the radiological images as listed and agreed with the findings in the report. Dg Lumbar Spine Complete  11/24/2013   CLINICAL DATA:  Back pain, history prostate cancer, kidney stones, hypertension  EXAM: LUMBAR SPINE - 2 VIEWS  COMPARISON:  None  FINDINGS: Five non-rib-bearing lumbar vertebrae.  Osseous demineralization.  Vertebral body and disc space heights maintained.  No acute fracture, subluxation or bone destruction.  Facet degenerative changes lower lumbar spine.  Scattered  atherosclerotic calcifications aorta.  Left pelvic phleboliths.  No definite urinary tract calcification.  SI joints symmetric.  IMPRESSION: Osseous demineralization with minor facet degenerative changes lower lumbar spine.  No acute abnormalities.   Electronically Signed   By: Lavonia Dana M.D.   On: 11/24/2013 08:22    ASSESSMENT & PLAN:  #1 recurrent iron deficiency anemia #2 reactive thrombocytosis The patient has significant bleeding and he is symptomatic. We discussed some of the risks, benefits, and alternatives of blood transfusions. The patient is symptomatic from anemia and the hemoglobin level is critically low.  Some of the side-effects to be expected including risks of transfusion reactions, chills, infection, syndrome of volume overload and risk of hospitalization from various reasons and the patient is willing to proceed and went ahead  to sign consent today. In addition to blood transfusion, I will also give him intravenous iron infusion next week and he agreed The most likely cause of his anemia is due to chronic blood loss. We discussed some of the risks, benefits, and alternatives of intravenous iron infusions. The patient is symptomatic from anemia and the iron level is critically low. He tolerated oral iron supplement poorly and desires to achieved higher levels of iron faster for adequate hematopoesis. Some of the side-effects to be expected including risks of infusion reactions, phlebitis, headaches, nausea and fatigue.  The patient is willing to proceed. Patient education material was dispensed.  Goal is to keep ferritin level greater than 50 #3 chronic hemorrhoidal bleeding I recommend general surgery referral for the patient to have hemorrhoidectomy.  All questions were answered. The patient knows to call the clinic with any problems, questions or concerns. No barriers to learning was detected.  I spent 40 minutes counseling the patient face to face. The total time spent in  the appointment was 55 minutes and more than 50% was on counseling.     Calico Lark, MD 11/24/2013 12:13 PM

## 2013-11-24 NOTE — Telephone Encounter (Signed)
Gave pt appt for lab,md and appt with sdurgeon this Friday, pt to see Dr Donella Stade

## 2013-11-24 NOTE — Telephone Encounter (Signed)
Per staff message and POF I have scheduled appts.  JMW  

## 2013-11-25 LAB — TYPE AND SCREEN
ABO/RH(D): O POS
ANTIBODY SCREEN: NEGATIVE
UNIT DIVISION: 0
UNIT DIVISION: 0

## 2013-11-26 ENCOUNTER — Ambulatory Visit (INDEPENDENT_AMBULATORY_CARE_PROVIDER_SITE_OTHER): Payer: Medicare Other | Admitting: Surgery

## 2013-11-26 ENCOUNTER — Encounter (INDEPENDENT_AMBULATORY_CARE_PROVIDER_SITE_OTHER): Payer: Self-pay | Admitting: Surgery

## 2013-11-26 VITALS — BP 128/70 | HR 65 | Temp 97.0°F | Ht 68.0 in | Wt 204.8 lb

## 2013-11-26 DIAGNOSIS — K648 Other hemorrhoids: Secondary | ICD-10-CM

## 2013-11-26 LAB — HAPTOGLOBIN: Haptoglobin: 183 mg/dL (ref 45–215)

## 2013-11-26 LAB — DIRECT ANTIGLOBULIN TEST (NOT AT ARMC)
DAT (Complement): NEGATIVE
DAT IgG: NEGATIVE

## 2013-11-26 LAB — ERYTHROPOIETIN: ERYTHROPOIETIN: 770.7 m[IU]/mL — AB (ref 2.6–18.5)

## 2013-11-26 MED ORDER — HYDROCORTISONE ACE-PRAMOXINE 1-1 % RE FOAM: 1.0000 | Freq: Two times a day (BID) | RECTAL | Status: DC

## 2013-11-26 NOTE — Patient Instructions (Signed)
High-Fiber Diet Fiber is found in fruits, vegetables, and grains. A high-fiber diet encourages the addition of more whole grains, legumes, fruits, and vegetables in your diet. The recommended amount of fiber for adult males is 38 g per day. For adult females, it is 25 g per day. Pregnant and lactating women should get 28 g of fiber per day. If you have a digestive or bowel problem, ask your caregiver for advice before adding high-fiber foods to your diet. Eat a variety of high-fiber foods instead of only a select few type of foods.  PURPOSE  To increase stool bulk.  To make bowel movements more regular to prevent constipation.  To lower cholesterol.  To prevent overeating. WHEN IS THIS DIET USED?  It may be used if you have constipation and hemorrhoids.  It may be used if you have uncomplicated diverticulosis (intestine condition) and irritable bowel syndrome.  It may be used if you need help with weight management.  It may be used if you want to add it to your diet as a protective measure against atherosclerosis, diabetes, and cancer. SOURCES OF FIBER  Whole-grain breads and cereals.  Fruits, such as apples, oranges, bananas, berries, prunes, and pears.  Vegetables, such as green peas, carrots, sweet potatoes, beets, broccoli, cabbage, spinach, and artichokes.  Legumes, such split peas, soy, lentils.  Almonds. FIBER CONTENT IN FOODS Starches and Grains / Dietary Fiber (g)  Cheerios, 1 cup / 3 g  Corn Flakes cereal, 1 cup / 0.7 g  Rice crispy treat cereal, 1 cup / 0.3 g  Instant oatmeal (cooked),  cup / 2 g  Frosted wheat cereal, 1 cup / 5.1 g  Brown, long-grain rice (cooked), 1 cup / 3.5 g  White, long-grain rice (cooked), 1 cup / 0.6 g  Enriched macaroni (cooked), 1 cup / 2.5 g Legumes / Dietary Fiber (g)  Baked beans (canned, plain, or vegetarian),  cup / 5.2 g  Kidney beans (canned),  cup / 6.8 g  Pinto beans (cooked),  cup / 5.5 g Breads and Crackers  / Dietary Fiber (g)  Plain or honey graham crackers, 2 squares / 0.7 g  Saltine crackers, 3 squares / 0.3 g  Plain, salted pretzels, 10 pieces / 1.8 g  Whole-wheat bread, 1 slice / 1.9 g  White bread, 1 slice / 0.7 g  Raisin bread, 1 slice / 1.2 g  Plain bagel, 3 oz / 2 g  Flour tortilla, 1 oz / 0.9 g  Corn tortilla, 1 small / 1.5 g  Hamburger or hotdog bun, 1 small / 0.9 g Fruits / Dietary Fiber (g)  Apple with skin, 1 medium / 4.4 g  Sweetened applesauce,  cup / 1.5 g  Banana,  medium / 1.5 g  Grapes, 10 grapes / 0.4 g  Orange, 1 small / 2.3 g  Raisin, 1.5 oz / 1.6 g  Melon, 1 cup / 1.4 g Vegetables / Dietary Fiber (g)  Green beans (canned),  cup / 1.3 g  Carrots (cooked),  cup / 2.3 g  Broccoli (cooked),  cup / 2.8 g  Peas (cooked),  cup / 4.4 g  Mashed potatoes,  cup / 1.6 g  Lettuce, 1 cup / 0.5 g  Corn (canned),  cup / 1.6 g  Tomato,  cup / 1.1 g Document Released: 07/22/2005 Document Revised: 01/21/2012 Document Reviewed: 10/24/2011 Exit[employer] Patient Information 2014 [employer]. Hemorrhoids Hemorrhoids are swollen veins around the rectum or anus. There are two types of hemorrhoids:     Internal hemorrhoids. These occur in the veins just inside the rectum. They may poke through to the outside and become irritated and painful.  External hemorrhoids. These occur in the veins outside the anus and can be felt as a painful swelling or hard lump near the anus. CAUSES  Pregnancy.   Obesity.   Constipation or diarrhea.   Straining to have a bowel movement.   Sitting for long periods on the toilet.  Heavy lifting or other activity that caused you to strain.  Anal intercourse. SYMPTOMS   Pain.   Anal itching or irritation.   Rectal bleeding.   Fecal leakage.   Anal swelling.   One or more lumps around the anus.  DIAGNOSIS  Your caregiver may be able to diagnose hemorrhoids by visual examination. Other  examinations or tests that may be performed include:   Examination of the rectal area with a gloved hand (digital rectal exam).   Examination of anal canal using a small tube (scope).   A blood test if you have lost a significant amount of blood.  A test to look inside the colon (sigmoidoscopy or colonoscopy). TREATMENT Most hemorrhoids can be treated at home. However, if symptoms do not seem to be getting better or if you have a lot of rectal bleeding, your caregiver may perform a procedure to help make the hemorrhoids get smaller or remove them completely. Possible treatments include:   Placing a rubber band at the base of the hemorrhoid to cut off the circulation (rubber band ligation).   Injecting a chemical to shrink the hemorrhoid (sclerotherapy).   Using a tool to burn the hemorrhoid (infrared light therapy).   Surgically removing the hemorrhoid (hemorrhoidectomy).   Stapling the hemorrhoid to block blood flow to the tissue (hemorrhoid stapling).  HOME CARE INSTRUCTIONS   Eat foods with fiber, such as whole grains, beans, nuts, fruits, and vegetables. Ask your doctor about taking products with added fiber in them (fibersupplements).  Increase fluid intake. Drink enough water and fluids to keep your urine clear or pale yellow.   Exercise regularly.   Go to the bathroom when you have the urge to have a bowel movement. Do not wait.   Avoid straining to have bowel movements.   Keep the anal area dry and clean. Use wet toilet paper or moist towelettes after a bowel movement.   Medicated creams and suppositories may be used or applied as directed.   Only take over-the-counter or prescription medicines as directed by your caregiver.   Take warm sitz baths for 15 20 minutes, 3 4 times a day to ease pain and discomfort.   Place ice packs on the hemorrhoids if they are tender and swollen. Using ice packs between sitz baths may be helpful.   Put ice in a  plastic bag.   Place a towel between your skin and the bag.   Leave the ice on for 15 20 minutes, 3 4 times a day.   Do not use a donut-shaped pillow or sit on the toilet for long periods. This increases blood pooling and pain.  SEEK MEDICAL CARE IF:  You have increasing pain and swelling that is not controlled by treatment or medicine.  You have uncontrolled bleeding.  You have difficulty or you are unable to have a bowel movement.  You have pain or inflammation outside the area of the hemorrhoids. MAKE SURE YOU:  Understand these instructions.  Will watch your condition.  Will get help right away if you   are not doing well or get worse. Document Released: 07/19/2000 Document Revised: 07/08/2012 Document Reviewed: 05/26/2012 Exit[employer] Patient Information 2014 [employer].  

## 2013-11-26 NOTE — Progress Notes (Signed)
Patient ID: Paul [name], male   DOB: 1941-01-21, 73 y.o.   MRN: [id number]  Chief Complaint  Patient presents with  . eval hems    new pt    HPI Paul [name] is a 73 y.o. male.   HPI Patient sat at the request Michiels Lark, MD for hemorrhoid disease. Patient is being treated for anemia. He has been worked up with upper lower endoscopy and has required higher transfusions. He has intermittent rectal bleeding. This does not appear cavity. It is not with every bowel movement. He does notice blood when he wipes but not after each bowel movement. He has intermittent bouts of blood in the commode. He is unclear how often this happens it is not with every bowel movement.he states he spent 1 hour having a bowel movement. He likes to read on the commode.   Past Medical History  Diagnosis Date  . Hypertension   . Hyperlipidemia   . Iron deficiency anemia     presumped malabsorption; neg EGD/ capsule endos/colosopcy  . Iron deficiency anemia, unspecified 10/16/2012  . 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 Kidney stones     Past Surgical History  Procedure Laterality Date  . Robtic assisted laparoscopic      radical prostatectomy and bilateral pelvic lymphadenectomy    Family History  Problem Relation Age of Onset  . Heart failure Mother   . Diabetes type II Mother     Social History History  Substance Use Topics  . Smoking status: Former Smoker    Quit date: 09/18/1981  . Smokeless tobacco: Never Used  . Alcohol Use: No    Allergies  Allergen Reactions  . Sulfonamide Derivatives     REACTION: rash    Current Outpatient Prescriptions  Medication Sig Dispense Refill  . amLODipine (NORVASC) 5 MG tablet Take 1 tablet (5 mg total) by mouth daily.  90 tablet  3  . aspirin 81 MG tablet Take 81 mg by mouth daily.      Marland Kitchen atorvastatin (LIPITOR) 40 MG tablet Take 1  tablet (40 mg total) by mouth daily.  90 tablet  3  . Cholecalciferol (VITAMIN D3) 2000 UNITS TABS Take 2,000 Units by mouth daily.      . folic acid (FOLVITE) 308 MCG tablet Take 400 mcg by mouth daily.      Marland Kitchen lisinopril (PRINIVIL,ZESTRIL) 20 MG tablet Take 1 tablet (20 mg total) by mouth daily.  90 tablet  3  . pantoprazole (PROTONIX) 40 MG tablet Take 1 tablet (40 mg total) by mouth daily.  90 tablet  3  . vitamin B-12 (CYANOCOBALAMIN) 1000 MCG tablet Take 1,000 mcg by mouth daily.      . hydrocortisone-pramoxine (PROCTOFOAM HC) rectal foam Place 1 applicator rectally 2 (two) times daily.  10 g  0   No current facility-administered medications for this visit.    Review of Systems Review of Systems  Constitutional: Negative.   HENT: Negative.   Gastrointestinal: Positive for blood in stool and anal bleeding. Negative for constipation and rectal pain.  Musculoskeletal: Negative.   Skin: Negative.   Neurological: Negative.   Psychiatric/Behavioral: Negative.     Blood pressure 128/70, pulse 65, temperature 97 F (36.1 C), height 5\' 8"  (1.727 m), weight 204 lb 12.8 oz (92.897 kg).  Physical Exam Physical Exam  Constitutional: He is oriented to person, place, and time.  He appears well-developed and well-nourished.  HENT:  Head: Normocephalic and atraumatic.  Eyes: Pupils are equal, round, and reactive to light. No scleral icterus.  Neck: Normal range of motion.  Cardiovascular: Normal rate.   Pulmonary/Chest: Effort normal.  Genitourinary:     Lymphadenopathy:    He has no cervical adenopathy.  Neurological: He is alert and oriented to person, place, and time.  Skin: Skin is warm and dry.    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 He's not having bloody bowel movements from time to the  bathroom. I discussed options of medical and surgical management with him. Based on his examination I feel all the used. Would recommend medical management to start with and evaluate his progress. Sclerotherapy could be offered if he fails medical management. If all fails , I discussed possible hemorrhoidectomy. Return to clinic in 3 weeks for recheck. Instructions given for hemorrhoid disease, dietary modification, avoidance of sitting on the commode for long periods of time and daily exercise.       Dong Nimmons A. Te[place] 11/26/2013, 10:38 AM

## 2013-11-28 NOTE — Progress Notes (Signed)
Quick Note:  Call Patient Labs that are abnormal: Besides the low hemoglobin for which he was treated and transfused, the iron level is low. The rest of his labs were good The rest are at goal  Recommendations: Follow up with hematology  And copy of labs to hematology.Dr Alvy Bimler   ______

## 2013-11-29 ENCOUNTER — Telehealth: Payer: Self-pay | Admitting: Family Medicine

## 2013-11-29 NOTE — Telephone Encounter (Signed)
Message copied by Waverly Ferrari on Mon Nov 29, 2013  3:06 PM ------      Message from: Vernie Shanks      Created: Sun Nov 28, 2013  3:33 PM       Call Patient      Labs that are abnormal:      Besides the low hemoglobin for which he was treated and transfused, the iron level is low.      The rest of his labs were good      The rest are at goal            Recommendations:      Follow up with hematology       And copy of labs to hematology.Dr Alvy Bimler             ------

## 2013-11-30 ENCOUNTER — Ambulatory Visit (HOSPITAL_BASED_OUTPATIENT_CLINIC_OR_DEPARTMENT_OTHER): Payer: Medicare Other

## 2013-11-30 VITALS — BP 127/69 | HR 69 | Temp 98.4°F | Resp 18

## 2013-11-30 DIAGNOSIS — D509 Iron deficiency anemia, unspecified: Secondary | ICD-10-CM

## 2013-11-30 MED ORDER — FERUMOXYTOL INJECTION 510 MG/17 ML
1020.0000 mg | Freq: Once | INTRAVENOUS | Status: AC
  Administered 2013-11-30: 1020 mg via INTRAVENOUS
  Filled 2013-11-30: qty 34

## 2013-11-30 NOTE — Patient Instructions (Signed)

## 2013-12-06 ENCOUNTER — Other Ambulatory Visit: Payer: Medicare Other

## 2013-12-23 ENCOUNTER — Other Ambulatory Visit (HOSPITAL_BASED_OUTPATIENT_CLINIC_OR_DEPARTMENT_OTHER): Payer: Medicare Other

## 2013-12-23 ENCOUNTER — Ambulatory Visit (HOSPITAL_BASED_OUTPATIENT_CLINIC_OR_DEPARTMENT_OTHER): Payer: Medicare Other | Admitting: Hematology and Oncology

## 2013-12-23 ENCOUNTER — Telehealth: Payer: Self-pay | Admitting: Hematology and Oncology

## 2013-12-23 ENCOUNTER — Telehealth: Payer: Self-pay | Admitting: *Deleted

## 2013-12-23 VITALS — BP 142/67 | HR 70 | Temp 98.0°F | Resp 20 | Ht 68.0 in | Wt 204.4 lb

## 2013-12-23 DIAGNOSIS — D509 Iron deficiency anemia, unspecified: Secondary | ICD-10-CM

## 2013-12-23 DIAGNOSIS — K649 Unspecified hemorrhoids: Secondary | ICD-10-CM

## 2013-12-23 DIAGNOSIS — K644 Residual hemorrhoidal skin tags: Secondary | ICD-10-CM

## 2013-12-23 DIAGNOSIS — D473 Essential (hemorrhagic) thrombocythemia: Secondary | ICD-10-CM

## 2013-12-23 LAB — CBC & DIFF AND RETIC
BASO%: 0.5 % (ref 0.0–2.0)
Basophils Absolute: 0 10*3/uL (ref 0.0–0.1)
EOS%: 2.8 % (ref 0.0–7.0)
Eosinophils Absolute: 0.2 10*3/uL (ref 0.0–0.5)
HCT: 40 % (ref 38.4–49.9)
HGB: 12.8 g/dL — ABNORMAL LOW (ref 13.0–17.1)
Immature Retic Fract: 5.1 % (ref 3.00–10.60)
LYMPH#: 2.7 10*3/uL (ref 0.9–3.3)
LYMPH%: 41.5 % (ref 14.0–49.0)
MCH: 29 pg (ref 27.2–33.4)
MCHC: 32 g/dL (ref 32.0–36.0)
MCV: 90.7 fL (ref 79.3–98.0)
MONO#: 0.7 10*3/uL (ref 0.1–0.9)
MONO%: 10.9 % (ref 0.0–14.0)
NEUT#: 2.9 10*3/uL (ref 1.5–6.5)
NEUT%: 44.3 % (ref 39.0–75.0)
NRBC: 0 % (ref 0–0)
Platelets: 332 10*3/uL (ref 140–400)
RBC: 4.41 10*6/uL (ref 4.20–5.82)
RDW: 20.4 % — AB (ref 11.0–14.6)
RETIC %: 1.48 % (ref 0.80–1.80)
Retic Ct Abs: 65.27 10*3/uL (ref 34.80–93.90)
WBC: 6.5 10*3/uL (ref 4.0–10.3)

## 2013-12-23 LAB — FERRITIN CHCC: Ferritin: 202 ng/ml (ref 22–316)

## 2013-12-23 NOTE — Telephone Encounter (Signed)
Message copied by Cathlean Cower on Thu Dec 23, 2013 12:26 PM ------      Message from: [facility] [place]: Thu Dec 23, 2013 12:05 PM      Regarding: ferritin       pls let him know results, follow monthly      Thanks      ----- Message -----         From: [facility]         Sent: 12/23/2013   9:22 AM           To: Brouwer Lark, MD                   ------

## 2013-12-23 NOTE — Telephone Encounter (Signed)
Informed wife of Ferritin level today is good at 202.  Keep monthly lab appts as scheduled. She verbalized understanding.

## 2013-12-23 NOTE — Progress Notes (Signed)
[place] OFFICE PROGRESS NOTE  [name], MD DIAGNOSIS:  Recurrent iron deficiency anemia, likely due to recurrent GI bleed  SUMMARY OF HEMATOLOGIC HISTORY: This is a pleasant gentleman who has extensive workup in 2010 for anemia. He had EGD colonoscopy and capsule endoscopy that came back negative. Bone marrow aspirate and biopsy was negative. The patient was also diagnosed with prostate cancer and underwent prostatectomy. He never require any adjuvant treatment. In December 2014, he was found to have significant iron deficiency anemia and received intravenous iron infusion In January 2015, repeat EGD and colonoscopy revealed internal hemorrhoids. In April 2015, he has recurrence of severe anemia. He received intravenous iron infusion on 11/22/2013 with resolution of severe anemia by May 2015. INTERVAL HISTORY: Paul [name] 73 y.o. male returns for further followup. He has excellent energy level. He continues to has significant hemorrhoidal bleeding on a regular basis. The patient denies any recent signs or symptoms of bleeding such as spontaneous epistaxis, hematuria or hemoptysis.   I have reviewed the past medical history, past surgical history, social history and family history with the patient and they are unchanged from previous note.  ALLERGIES:  is allergic to sulfonamide derivatives.  MEDICATIONS:  Current Outpatient Prescriptions  Medication Sig Dispense Refill  . amLODipine (NORVASC) 5 MG tablet Take 1 tablet (5 mg total) by mouth daily.  90 tablet  3  . aspirin 81 MG tablet Take 81 mg by mouth daily.      Marland Kitchen atorvastatin (LIPITOR) 40 MG tablet Take 1 tablet (40 mg total) by mouth daily.  90 tablet  3  . Cholecalciferol (VITAMIN D3) 2000 UNITS TABS Take 2,000 Units by mouth daily.      . folic acid (FOLVITE) 235 MCG tablet Take 400 mcg by mouth daily.      . hydrocortisone-pramoxine (PROCTOFOAM HC) rectal foam Place 1 applicator rectally 2  (two) times daily.  10 g  0  . lisinopril (PRINIVIL,ZESTRIL) 20 MG tablet Take 1 tablet (20 mg total) by mouth daily.  90 tablet  3  . pantoprazole (PROTONIX) 40 MG tablet Take 1 tablet (40 mg total) by mouth daily.  90 tablet  3  . vitamin B-12 (CYANOCOBALAMIN) 1000 MCG tablet Take 1,000 mcg by mouth daily.       No current facility-administered medications for this visit.     REVIEW OF SYSTEMS:   Behavioral/Psych: Mood is stable, no new changes  All other systems were reviewed with the patient and are negative.  PHYSICAL EXAMINATION: ECOG PERFORMANCE STATUS: 0 - Asymptomatic  Filed Vitals:   12/23/13 0938  BP: 142/67  Pulse: 70  Temp: 98 F (36.7 C)  Resp: 20   Filed Weights   12/23/13 0938  Weight: 204 lb 6.4 oz (92.715 kg)    GENERAL:alert, no distress and comfortable SKIN: skin color, texture, turgor are normal, no rashes or significant lesions EYES: normal, Conjunctiva are pink and non-injected, sclera clear Musculoskeletal:no cyanosis of digits and no clubbing  NEURO: alert & oriented x 3 with fluent speech, no focal motor/sensory deficits  LABORATORY DATA:  I have reviewed the data as listed Results for orders placed in visit on 12/23/13 (from the past 48 hour(s))  CBC & DIFF AND RETIC     Status: Abnormal   Collection Time    12/23/13  9:09 AM      Result Value Ref Range   WBC 6.5  4.0 - 10.3 10e3/uL   NEUT# 2.9  1.5 - 6.5  10e3/uL   HGB 12.8 (*) 13.0 - 17.1 g/dL   HCT 40.0  38.4 - 49.9 %   Platelets 332  140 - 400 10e3/uL   MCV 90.7  79.3 - 98.0 fL   MCH 29.0  27.2 - 33.4 pg   MCHC 32.0  32.0 - 36.0 g/dL   RBC 4.41  4.20 - 5.82 10e6/uL   RDW 20.4 (*) 11.0 - 14.6 %   lymph# 2.7  0.9 - 3.3 10e3/uL   MONO# 0.7  0.1 - 0.9 10e3/uL   Eosinophils Absolute 0.2  0.0 - 0.5 10e3/uL   Basophils Absolute 0.0  0.0 - 0.1 10e3/uL   NEUT% 44.3  39.0 - 75.0 %   LYMPH% 41.5  14.0 - 49.0 %   MONO% 10.9  0.0 - 14.0 %   EOS% 2.8  0.0 - 7.0 %   BASO% 0.5  0.0 - 2.0 %    nRBC 0  0 - 0 %   Retic % 1.48  0.80 - 1.80 %   Retic Ct Abs 65.27  34.80 - 93.90 10e3/uL   Immature Retic Fract 5.10  3.00 - 10.60 %    Lab Results  Component Value Date   WBC 6.5 12/23/2013   HGB 12.8* 12/23/2013   HCT 40.0 12/23/2013   MCV 90.7 12/23/2013   PLT 332 12/23/2013    ASSESSMENT & PLAN:  #1 recurrent iron deficiency anemia #2 reactive thrombocytosis, resolved The patient has significant bleeding recently, resolved with blood transfusion and iron infusion. The most likely cause of his anemia is due to chronic blood loss. Repeat serum ferritin is pending today. I would like to obtain ferritin level greater than 100. If it is less than 100, we will call him back to get another iron infusion. He will come back to my office on a monthly basis to get his blood rechecked and he understands the importance of regular iron infusion as needed to prevent severe anemia again. The patient will continue on high-dose folic acid and vitamin B12 supplements. Additional workup excluded hemolysis. His serum erythropoietin level is appropriate for the degree of anemia. #3 chronic hemorrhoidal bleeding I recommend patient to general surgery referral for further management of severe hemorrhoids  All questions were answered. The patient knows to call the clinic with any problems, questions or concerns. No barriers to learning was detected.  I spent 15 minutes counseling the patient face to face. The total time spent in the appointment was 20 minutes and more than 50% was on counseling.     Basulto Lark, MD 12/23/2013 10:14 AM

## 2013-12-23 NOTE — Telephone Encounter (Signed)
gv adn printed appt sched and avs for pt for June thru [facility]

## 2013-12-24 ENCOUNTER — Encounter (INDEPENDENT_AMBULATORY_CARE_PROVIDER_SITE_OTHER): Payer: Self-pay | Admitting: Surgery

## 2013-12-24 ENCOUNTER — Ambulatory Visit (INDEPENDENT_AMBULATORY_CARE_PROVIDER_SITE_OTHER): Payer: Medicare Other | Admitting: Surgery

## 2013-12-24 VITALS — BP 122/70 | HR 76 | Temp 97.0°F | Resp 12 | Ht 68.0 in | Wt 203.2 lb

## 2013-12-24 DIAGNOSIS — K648 Other hemorrhoids: Secondary | ICD-10-CM | POA: Insufficient documentation

## 2013-12-24 MED ORDER — HYDROCORTISONE ACE-PRAMOXINE 1-1 % RE FOAM: 1.0000 | Freq: Two times a day (BID) | RECTAL | Status: DC

## 2013-12-24 NOTE — Patient Instructions (Signed)
Continue medical management.  Return 5 weeks.

## 2013-12-24 NOTE — Progress Notes (Signed)
Patient ID: Paul [name], male   DOB: [birth year], 73 y.o.   MRN: [id number]  Chief Complaint  Patient presents with  . Routine Post Op    3 wk hem chk    HPI Paul [name] is a 73 y.o. male.   HPI Patient sat at the request Armijo Lark, MD for hemorrhoid disease. Patient is being treated for anemia. He has been worked up with upper lower endoscopy and has required higher transfusions. He has intermittent rectal bleeding. This does not appear cavity. It is not with every bowel movement. He does notice blood when he wipes but not after each bowel movement. He has intermittent bouts of blood in the commode. He is unclear how often this happens it is not with every bowel movement.he states he spent 1 hour having a bowel movement. He likes to read on the commode. The  Bleeding episodes are much less on medications according to patient.    Past Medical History  Diagnosis Date  . Hypertension   . Hyperlipidemia   . Iron deficiency anemia     presumped malabsorption; neg EGD/ capsule endos/colosopcy  . Iron deficiency anemia, unspecified 10/16/2012  . 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 Kidney stones     Past Surgical History  Procedure Laterality Date  . Robtic assisted laparoscopic      radical prostatectomy and bilateral pelvic lymphadenectomy    Family History  Problem Relation Age of Onset  . Heart failure Mother   . Diabetes type II Mother     Social History History  Substance Use Topics  . Smoking status: Former Smoker    Quit date: 09/18/1981  . Smokeless tobacco: Never Used  . Alcohol Use: No    Allergies  Allergen Reactions  . Sulfonamide Derivatives     REACTION: rash    Current Outpatient Prescriptions  Medication Sig Dispense Refill  . amLODipine (NORVASC) 5 MG tablet Take 1 tablet (5 mg total) by mouth daily.  90 tablet  3  . aspirin 81 MG  tablet Take 81 mg by mouth daily.      Marland Kitchen atorvastatin (LIPITOR) 40 MG tablet Take 1 tablet (40 mg total) by mouth daily.  90 tablet  3  . Cholecalciferol (VITAMIN D3) 2000 UNITS TABS Take 2,000 Units by mouth daily.      . folic acid (FOLVITE) 716 MCG tablet Take 400 mcg by mouth daily.      . hydrocortisone-pramoxine (PROCTOFOAM HC) rectal foam Place 1 applicator rectally 2 (two) times daily.  10 g  0  . lisinopril (PRINIVIL,ZESTRIL) 20 MG tablet Take 1 tablet (20 mg total) by mouth daily.  90 tablet  3  . pantoprazole (PROTONIX) 40 MG tablet Take 1 tablet (40 mg total) by mouth daily.  90 tablet  3  . vitamin B-12 (CYANOCOBALAMIN) 1000 MCG tablet Take 1,000 mcg by mouth daily.       No current facility-administered medications for this visit.    Review of Systems Review of Systems  Constitutional: Negative.   HENT: Negative.   Gastrointestinal: Positive for blood in stool and anal bleeding. Negative for constipation and rectal pain.  Musculoskeletal: Negative.   Skin: Negative.   Neurological: Negative.   Psychiatric/Behavioral: Negative.     Blood pressure 122/70, pulse 76, temperature 97 F (36.1 C), temperature source Temporal, resp. rate 12, height 5\' 8"  (1.727 m),  weight 203 lb 3.2 oz (92.171 kg).  Physical Exam Physical Exam  Constitutional: He is oriented to person, place, and time. He appears well-developed and well-nourished.  HENT:  Head: Normocephalic and atraumatic.  Eyes: Pupils are equal, round, and reactive to light. No scleral icterus.  Neck: Normal range of motion.  Cardiovascular: Normal rate.   Pulmonary/Chest: Effort normal.  Genitourinary:   Grade 2 grade 3 3 column int/ ext hemorrhoids.    Lymphadenopathy:    He has no cervical adenopathy.  Neurological: He is alert and oriented to person, place, and time.  Skin: Skin is warm and dry.    Data Reviewed Hematology notes  Assessment    Grade 2  To grade 3 internal hemorrhoid  disease Anemia Otherwise negative GI work up    [place] discussion with patient and wife today. As I stated before, hemorrhoids unlikely cause of anemia. Discussed continuing medical management since his episodes of rectal bleeding or much less versus formal hemorrhoidectomy. Unlikely office-based procedures of any benefit for this situation. I discussed the pros and cons of hemorrhoidectomy and of office-based procedures at treating iron deficiency anemia. Complications of bleeding, infection, incontinence, injury to neighboring structures, and the need for corrective operative procedures the above occur as risks of procedure. He is not a candidate for PPH. Patient time to think about his options and will return to see me in about 5 weeks. If the anemia does not improve despite the fact is losing less blood the GI tract, doubtful hemorrhoidectomy will make any difference. If he improves in his hemoglobin and hematocrit levels with less rectal bleeding, surgery may be of benefit       Cynitha Berte A. Cesare [place]lin 12/24/2013, 9:35 AM

## 2014-01-20 ENCOUNTER — Ambulatory Visit (HOSPITAL_BASED_OUTPATIENT_CLINIC_OR_DEPARTMENT_OTHER): Payer: Medicare Other

## 2014-01-20 ENCOUNTER — Other Ambulatory Visit: Payer: Self-pay | Admitting: Hematology and Oncology

## 2014-01-20 ENCOUNTER — Other Ambulatory Visit (HOSPITAL_BASED_OUTPATIENT_CLINIC_OR_DEPARTMENT_OTHER): Payer: Medicare Other

## 2014-01-20 VITALS — BP 140/67 | HR 72 | Temp 97.6°F | Resp 18

## 2014-01-20 DIAGNOSIS — D509 Iron deficiency anemia, unspecified: Secondary | ICD-10-CM

## 2014-01-20 LAB — CBC & DIFF AND RETIC
BASO%: 0.5 % (ref 0.0–2.0)
Basophils Absolute: 0 10*3/uL (ref 0.0–0.1)
EOS ABS: 0.2 10*3/uL (ref 0.0–0.5)
EOS%: 3.8 % (ref 0.0–7.0)
HEMATOCRIT: 29.5 % — AB (ref 38.4–49.9)
HGB: 9.2 g/dL — ABNORMAL LOW (ref 13.0–17.1)
IMMATURE RETIC FRACT: 21.8 % — AB (ref 3.00–10.60)
LYMPH%: 35.2 % (ref 14.0–49.0)
MCH: 28.9 pg (ref 27.2–33.4)
MCHC: 31.2 g/dL — ABNORMAL LOW (ref 32.0–36.0)
MCV: 92.8 fL (ref 79.3–98.0)
MONO#: 0.8 10*3/uL (ref 0.1–0.9)
MONO%: 13.8 % (ref 0.0–14.0)
NEUT#: 2.7 10*3/uL (ref 1.5–6.5)
NEUT%: 46.7 % (ref 39.0–75.0)
Platelets: 454 10*3/uL — ABNORMAL HIGH (ref 140–400)
RBC: 3.18 10*6/uL — ABNORMAL LOW (ref 4.20–5.82)
RDW: 18.5 % — ABNORMAL HIGH (ref 11.0–14.6)
Retic %: 3.61 % — ABNORMAL HIGH (ref 0.80–1.80)
Retic Ct Abs: 114.8 10*3/uL — ABNORMAL HIGH (ref 34.80–93.90)
WBC: 5.8 10*3/uL (ref 4.0–10.3)
lymph#: 2 10*3/uL (ref 0.9–3.3)
nRBC: 0 % (ref 0–0)

## 2014-01-20 LAB — FERRITIN CHCC: Ferritin: 30 ng/ml (ref 22–316)

## 2014-01-20 LAB — HOLD TUBE, BLOOD BANK

## 2014-01-20 MED ORDER — SODIUM CHLORIDE 0.9 % IV SOLN
1020.0000 mg | Freq: Once | INTRAVENOUS | Status: AC
  Administered 2014-01-20: 1020 mg via INTRAVENOUS
  Filled 2014-01-20: qty 34

## 2014-01-20 MED ORDER — SODIUM CHLORIDE 0.9 % IV SOLN
Freq: Once | INTRAVENOUS | Status: AC
  Administered 2014-01-20: 11:00:00 via INTRAVENOUS

## 2014-01-20 NOTE — Patient Instructions (Signed)

## 2014-02-07 ENCOUNTER — Ambulatory Visit (INDEPENDENT_AMBULATORY_CARE_PROVIDER_SITE_OTHER): Payer: Medicare Other | Admitting: Surgery

## 2014-02-07 ENCOUNTER — Other Ambulatory Visit (INDEPENDENT_AMBULATORY_CARE_PROVIDER_SITE_OTHER): Payer: Self-pay | Admitting: Surgery

## 2014-02-07 ENCOUNTER — Encounter (INDEPENDENT_AMBULATORY_CARE_PROVIDER_SITE_OTHER): Payer: Self-pay | Admitting: Surgery

## 2014-02-07 VITALS — BP 122/74 | HR 86 | Temp 98.0°F | Ht 67.0 in | Wt 202.0 lb

## 2014-02-07 DIAGNOSIS — K648 Other hemorrhoids: Secondary | ICD-10-CM

## 2014-02-07 NOTE — Patient Instructions (Signed)
Return if symptoms worsen

## 2014-02-07 NOTE — Progress Notes (Signed)
Patient ID: Paul [name], male   DOB: 10/25/1940, 73 y.o.   MRN: [id number]  Chief Complaint  Patient presents with  . Follow-up    HPI Paul [name] is a 73 y.o. male.   HPI Pt  Returns. No rectal bleeding for 17 days now.  No bleeding when he wipes.   No pain or itching.  Past Medical History  Diagnosis Date  . Hypertension   . Hyperlipidemia   . Iron deficiency anemia     presumped malabsorption; neg EGD/ capsule endos/colosopcy  . Iron deficiency anemia, unspecified 10/16/2012  . 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 Kidney stones     Past Surgical History  Procedure Laterality Date  . Robtic assisted laparoscopic      radical prostatectomy and bilateral pelvic lymphadenectomy    Family History  Problem Relation Age of Onset  . Heart failure Mother   . Diabetes type II Mother     Social History History  Substance Use Topics  . Smoking status: Former Smoker    Quit date: 09/18/1981  . Smokeless tobacco: Never Used  . Alcohol Use: No    Allergies  Allergen Reactions  . Sulfonamide Derivatives     REACTION: rash    Current Outpatient Prescriptions  Medication Sig Dispense Refill  . amLODipine (NORVASC) 5 MG tablet Take 1 tablet (5 mg total) by mouth daily.  90 tablet  3  . aspirin 81 MG tablet Take 81 mg by mouth daily.      Marland Kitchen atorvastatin (LIPITOR) 40 MG tablet Take 1 tablet (40 mg total) by mouth daily.  90 tablet  3  . Cholecalciferol (VITAMIN D3) 2000 UNITS TABS Take 2,000 Units by mouth daily.      . folic acid (FOLVITE) 720 MCG tablet Take 400 mcg by mouth daily.      Marland Kitchen lisinopril (PRINIVIL,ZESTRIL) 20 MG tablet Take 1 tablet (20 mg total) by mouth daily.  90 tablet  3  . pantoprazole (PROTONIX) 40 MG tablet Take 1 tablet (40 mg total) by mouth daily.  90 tablet  3  . vitamin B-12 (CYANOCOBALAMIN) 1000 MCG tablet Take 1,000 mcg by mouth  daily.      Marland Kitchen PROCTOFOAM HC rectal foam APPLY ONE APPLICATORFUL RECTALLY TWICE DAILY  10 g  0   No current facility-administered medications for this visit.    Review of Systems Review of Systems  Constitutional: Negative.   HENT: Negative.   Gastrointestinal: Positive for blood in stool and anal bleeding. Negative for constipation and rectal pain.  Musculoskeletal: Negative.   Skin: Negative.   Neurological: Negative.   Psychiatric/Behavioral: Negative.     Blood pressure 122/74, pulse 86, temperature 98 F (36.7 C), height 5\' 7"  (1.702 m), weight 202 lb (91.627 kg).  Physical Exam Physical Exam  Constitutional: He is oriented to person, place, and time. He appears well-developed and well-nourished.  HENT:  Head: Normocephalic and atraumatic.  Eyes: Pupils are equal, round, and reactive to light. No scleral icterus.  Neck: Normal range of motion.  Cardiovascular: Normal rate.   Pulmonary/Chest: Effort normal.  Genitourinary:   Grade 2 grade 3 3 column int/ ext hemorrhoids.    Lymphadenopathy:    He has no cervical adenopathy.  Neurological: He is alert and oriented to person, place, and time.  Skin: Skin is warm and dry.    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 As I stated before, hemorrhoids unlikely cause of anemia. Discussed continuing medical management since his episodes of rectal bleeding or much less versus formal hemorrhoidectomy. Unlikely office-based procedures of any benefit for this situation. I discussed the pros and cons of hemorrhoidectomy and of office-based procedures at treating iron deficiency anemia. Complications of bleeding, infection, incontinence, injury to neighboring structures, and the need for corrective operative procedures the above occur as risks of procedure. Continue to follow and monitor  for signs of bleeding.    Elfrieda Espino A. 02/07/2014, 2:53 PM

## 2014-02-17 ENCOUNTER — Other Ambulatory Visit (HOSPITAL_BASED_OUTPATIENT_CLINIC_OR_DEPARTMENT_OTHER): Payer: Medicare Other

## 2014-02-17 ENCOUNTER — Telehealth: Payer: Self-pay | Admitting: *Deleted

## 2014-02-17 DIAGNOSIS — D473 Essential (hemorrhagic) thrombocythemia: Secondary | ICD-10-CM

## 2014-02-17 DIAGNOSIS — D509 Iron deficiency anemia, unspecified: Secondary | ICD-10-CM

## 2014-02-17 LAB — CBC & DIFF AND RETIC
BASO%: 0.4 % (ref 0.0–2.0)
Basophils Absolute: 0 10*3/uL (ref 0.0–0.1)
EOS ABS: 0.1 10*3/uL (ref 0.0–0.5)
EOS%: 1.8 % (ref 0.0–7.0)
HCT: 40.8 % (ref 38.4–49.9)
HGB: 13.1 g/dL (ref 13.0–17.1)
Immature Retic Fract: 8.1 % (ref 3.00–10.60)
LYMPH%: 35.2 % (ref 14.0–49.0)
MCH: 31.1 pg (ref 27.2–33.4)
MCHC: 32.1 g/dL (ref 32.0–36.0)
MCV: 96.9 fL (ref 79.3–98.0)
MONO#: 0.8 10*3/uL (ref 0.1–0.9)
MONO%: 10.5 % (ref 0.0–14.0)
NEUT%: 52.1 % (ref 39.0–75.0)
NEUTROS ABS: 4 10*3/uL (ref 1.5–6.5)
Platelets: 299 10*3/uL (ref 140–400)
RBC: 4.21 10*6/uL (ref 4.20–5.82)
RDW: 17.9 % — AB (ref 11.0–14.6)
RETIC %: 1.05 % (ref 0.80–1.80)
Retic Ct Abs: 44.21 10*3/uL (ref 34.80–93.90)
WBC: 7.6 10*3/uL (ref 4.0–10.3)
lymph#: 2.7 10*3/uL (ref 0.9–3.3)

## 2014-02-17 LAB — HOLD TUBE, BLOOD BANK

## 2014-02-17 LAB — FERRITIN CHCC: Ferritin: 137 ng/ml (ref 22–316)

## 2014-02-17 NOTE — Telephone Encounter (Signed)
S/w pt and wife in lobby this morning.  Informed results of CBC improved.  No need for transfusion today.  Keep next appt 8/13 as scheduled.  They verbalized understanding.

## 2014-03-17 ENCOUNTER — Other Ambulatory Visit (HOSPITAL_BASED_OUTPATIENT_CLINIC_OR_DEPARTMENT_OTHER): Payer: Medicare Other

## 2014-03-17 DIAGNOSIS — D509 Iron deficiency anemia, unspecified: Secondary | ICD-10-CM

## 2014-03-17 LAB — CBC & DIFF AND RETIC
BASO%: 0.3 % (ref 0.0–2.0)
BASOS ABS: 0 10*3/uL (ref 0.0–0.1)
EOS%: 2.8 % (ref 0.0–7.0)
Eosinophils Absolute: 0.2 10*3/uL (ref 0.0–0.5)
HEMATOCRIT: 39.4 % (ref 38.4–49.9)
HEMOGLOBIN: 12.8 g/dL — AB (ref 13.0–17.1)
Immature Retic Fract: 9.5 % (ref 3.00–10.60)
LYMPH%: 39.9 % (ref 14.0–49.0)
MCH: 31.6 pg (ref 27.2–33.4)
MCHC: 32.5 g/dL (ref 32.0–36.0)
MCV: 97.3 fL (ref 79.3–98.0)
MONO#: 0.7 10*3/uL (ref 0.1–0.9)
MONO%: 9.6 % (ref 0.0–14.0)
NEUT#: 3.5 10*3/uL (ref 1.5–6.5)
NEUT%: 47.4 % (ref 39.0–75.0)
Platelets: 326 10*3/uL (ref 140–400)
RBC: 4.05 10*6/uL — ABNORMAL LOW (ref 4.20–5.82)
RDW: 15.9 % — ABNORMAL HIGH (ref 11.0–14.6)
Retic %: 1.57 % (ref 0.80–1.80)
Retic Ct Abs: 63.59 10*3/uL (ref 34.80–93.90)
WBC: 7.5 10*3/uL (ref 4.0–10.3)
lymph#: 3 10*3/uL (ref 0.9–3.3)

## 2014-03-17 LAB — FERRITIN CHCC: FERRITIN: 36 ng/mL (ref 22–316)

## 2014-03-17 LAB — HOLD TUBE, BLOOD BANK

## 2014-03-19 ENCOUNTER — Telehealth: Payer: Self-pay | Admitting: *Deleted

## 2014-03-19 NOTE — Telephone Encounter (Signed)
Left VM for pt and wife to please return nurse's call on Monday so I can relay Dr. Calton Dach message/question below.

## 2014-03-19 NOTE — Telephone Encounter (Signed)
Message copied by Cathlean Cower on Sat Mar 19, 2014  1:04 PM ------      Message from: [facility] [place], [place]: Fri Mar 18, 2014  7:20 PM      Regarding: cbc results       He has started to become anemic again. No need infusion now but getting close. Can you ask him if he is still bleeding?      ----- Message -----         From: [facility]         Sent: 03/17/2014  10:07 AM           To: Mondo Lark, MD                   ------

## 2014-03-21 ENCOUNTER — Telehealth: Payer: Self-pay | Admitting: *Deleted

## 2014-03-21 NOTE — Telephone Encounter (Signed)
Wife states pt still has occasional bright red blood from rectum r/t hemorrhoids.  It is not as bad as it was before.  Instructed her to keep appt for lab on 9/10 as scheduled.  She verbalized understanding.

## 2014-03-21 NOTE — Telephone Encounter (Signed)
Message copied by Cathlean Cower on Mon Mar 21, 2014 10:06 AM ------      Message from: [facility], [place]      Created: Fri Mar 18, 2014  7:20 PM      Regarding: cbc results       He has started to become anemic again. No need infusion now but getting close. Can you ask him if he is still bleeding?      ----- Message -----         From: [facility]         Sent: 03/17/2014  10:07 AM           To: Reffitt Lark, MD                   ------

## 2014-04-04 ENCOUNTER — Other Ambulatory Visit (HOSPITAL_BASED_OUTPATIENT_CLINIC_OR_DEPARTMENT_OTHER): Payer: Medicare Other

## 2014-04-04 ENCOUNTER — Telehealth: Payer: Self-pay | Admitting: *Deleted

## 2014-04-04 ENCOUNTER — Encounter: Payer: Self-pay | Admitting: *Deleted

## 2014-04-04 ENCOUNTER — Other Ambulatory Visit: Payer: Self-pay | Admitting: Hematology and Oncology

## 2014-04-04 ENCOUNTER — Telehealth: Payer: Self-pay | Admitting: Hematology and Oncology

## 2014-04-04 ENCOUNTER — Other Ambulatory Visit: Payer: Self-pay | Admitting: *Deleted

## 2014-04-04 DIAGNOSIS — D509 Iron deficiency anemia, unspecified: Secondary | ICD-10-CM

## 2014-04-04 DIAGNOSIS — D473 Essential (hemorrhagic) thrombocythemia: Secondary | ICD-10-CM

## 2014-04-04 LAB — CBC & DIFF AND RETIC
BASO%: 0.4 % (ref 0.0–2.0)
BASOS ABS: 0 10*3/uL (ref 0.0–0.1)
EOS ABS: 0.2 10*3/uL (ref 0.0–0.5)
EOS%: 2.3 % (ref 0.0–7.0)
HEMATOCRIT: 36.4 % — AB (ref 38.4–49.9)
HEMOGLOBIN: 11.8 g/dL — AB (ref 13.0–17.1)
Immature Retic Fract: 18.4 % — ABNORMAL HIGH (ref 3.00–10.60)
LYMPH%: 30.6 % (ref 14.0–49.0)
MCH: 31.1 pg (ref 27.2–33.4)
MCHC: 32.4 g/dL (ref 32.0–36.0)
MCV: 95.8 fL (ref 79.3–98.0)
MONO#: 0.7 10*3/uL (ref 0.1–0.9)
MONO%: 9.9 % (ref 0.0–14.0)
NEUT%: 56.8 % (ref 39.0–75.0)
NEUTROS ABS: 4.2 10*3/uL (ref 1.5–6.5)
PLATELETS: 387 10*3/uL (ref 140–400)
RBC: 3.8 10*6/uL — ABNORMAL LOW (ref 4.20–5.82)
RDW: 14.8 % — ABNORMAL HIGH (ref 11.0–14.6)
Retic %: 1.6 % (ref 0.80–1.80)
Retic Ct Abs: 60.8 10*3/uL (ref 34.80–93.90)
WBC: 7.5 10*3/uL (ref 4.0–10.3)
lymph#: 2.3 10*3/uL (ref 0.9–3.3)

## 2014-04-04 LAB — HOLD TUBE, BLOOD BANK

## 2014-04-04 LAB — FERRITIN CHCC: Ferritin: 16 ng/ml — ABNORMAL LOW (ref 22–316)

## 2014-04-04 NOTE — Telephone Encounter (Signed)
Called patient to confirm appt for IV iron tomorrow at 0800

## 2014-04-04 NOTE — Telephone Encounter (Signed)
Pt to do labs per 08/31 POF pt is aware...Marland KitchenMarland KitchenKJ

## 2014-04-04 NOTE — Telephone Encounter (Signed)
Pt states he has been having bleeding from hemorrhoids everyday for over a week, occasionally is dripping and he sees it in the commode. Is wanting to know if her needs to have labs checked prior top 9/10 appt?

## 2014-04-04 NOTE — Telephone Encounter (Signed)
Per desk RN I have scheduled appt for tomorrow. Desk RN to call the patient

## 2014-04-05 ENCOUNTER — Ambulatory Visit (HOSPITAL_BASED_OUTPATIENT_CLINIC_OR_DEPARTMENT_OTHER): Payer: Medicare Other

## 2014-04-05 VITALS — BP 128/70 | HR 76 | Temp 97.1°F

## 2014-04-05 DIAGNOSIS — D509 Iron deficiency anemia, unspecified: Secondary | ICD-10-CM

## 2014-04-05 MED ORDER — FERUMOXYTOL INJECTION 510 MG/17 ML
1020.0000 mg | Freq: Once | INTRAVENOUS | Status: AC
  Administered 2014-04-05: 1020 mg via INTRAVENOUS
  Filled 2014-04-05: qty 34

## 2014-04-05 NOTE — Patient Instructions (Signed)

## 2014-04-14 ENCOUNTER — Other Ambulatory Visit: Payer: Medicare Other

## 2014-04-20 ENCOUNTER — Ambulatory Visit: Payer: Medicare Other | Admitting: Family Medicine

## 2014-04-26 ENCOUNTER — Ambulatory Visit: Payer: Medicare Other | Admitting: Family Medicine

## 2014-05-05 ENCOUNTER — Ambulatory Visit (INDEPENDENT_AMBULATORY_CARE_PROVIDER_SITE_OTHER): Payer: Medicare Other | Admitting: Family Medicine

## 2014-05-05 ENCOUNTER — Encounter: Payer: Self-pay | Admitting: Family Medicine

## 2014-05-05 VITALS — BP 132/72 | HR 73 | Temp 97.9°F | Ht 67.0 in | Wt 203.0 lb

## 2014-05-05 DIAGNOSIS — I10 Essential (primary) hypertension: Secondary | ICD-10-CM

## 2014-05-05 DIAGNOSIS — E785 Hyperlipidemia, unspecified: Secondary | ICD-10-CM

## 2014-05-05 DIAGNOSIS — Z23 Encounter for immunization: Secondary | ICD-10-CM

## 2014-05-05 NOTE — Progress Notes (Signed)
Subjective:    Patient ID: Paul [name], male    DOB: March 15, 1941, 73 y.o.   MRN: [id number]  HPI  73 year old gentleman here to followup hypertension and hyperlipidemia. He has known particular complaints today except for some pain in the posterior aspect of his right knee. He has a history of prostate cancer and that is followed by urology in [place] Review of his lipids from April of this year showed good control and we will not check them today He has no complaints with his medication or any side effects thereof.    Review of Systems  Constitutional: Negative.   HENT: Negative.   Eyes: Negative.   Respiratory: Negative.  Negative for shortness of breath.   Cardiovascular: Negative.  Negative for chest pain and leg swelling.  Gastrointestinal: Positive for blood in stool.  Genitourinary: Negative.   Musculoskeletal: Negative.   Skin: Negative.   Neurological: Negative.   Psychiatric/Behavioral: Negative.   All other systems reviewed and are negative.      Objective:   Physical Exam  Constitutional: He is oriented to person, place, and time. He appears well-developed and well-nourished.  HENT:  Head: Normocephalic.  Right Ear: External ear normal.  Left Ear: External ear normal.  Nose: Nose normal.  Mouth/Throat: Oropharynx is clear and moist.  Eyes: Conjunctivae and EOM are normal. Pupils are equal, round, and reactive to light.  Neck: Normal range of motion. Neck supple.  Cardiovascular: Normal rate, regular rhythm, normal heart sounds and intact distal pulses.   Pulmonary/Chest: Effort normal and breath sounds normal.  Abdominal: Soft. Bowel sounds are normal.  Musculoskeletal: Normal range of motion.  Neurological: He is alert and oriented to person, place, and time.  Skin: Skin is warm and dry.  Psychiatric: He has a normal mood and affect. His behavior is normal. Judgment and thought content normal.          Assessment & Plan:  BP 132/72  Pulse 73   Temp(Src) 97.9 F (36.6 C) (Oral)  Ht 5\' 7"  (1.702 m)  Wt 203 lb (92.08 kg)  BMI 31.79 kg/m2 1. Essential hypertension, benign BP controlled  2. Hyperlipidemia Labs are good  Repeat in 6 months  3. Need for prophylactic vaccination and inoculation against influenza   @UMFCLOGO @  Patient ID: KEDDRICK WYNE MRN: [id number], DOB: April 23, 1941, 73 y.o. Date of Encounter: 05/05/2014, 9:06 AM  Primary Physician: [name], MD  Chief Complaint:   HPI: 73 y.o. year old male with history below presents with    Past Medical History  Diagnosis Date  . Hypertension   . Hyperlipidemia   . Iron deficiency anemia     presumped malabsorption; neg EGD/ capsule endos/colosopcy  . Iron deficiency anemia, unspecified 10/16/2012  . 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 Kidney stones      Home Meds: Prior to Admission medications   Medication Sig Start Date End Date Taking? Authorizing Provider  amLODipine (NORVASC) 5 MG tablet Take 1 tablet (5 mg total) by mouth daily. 11/23/13  Yes Vernie Shanks, MD  aspirin 81 MG tablet Take 81 mg by mouth daily.   Yes Historical Provider, MD  atorvastatin (LIPITOR) 40 MG tablet Take 1 tablet (40 mg total) by mouth daily. 11/23/13  Yes Vernie Shanks, MD  Cholecalciferol (VITAMIN D3) 2000 UNITS TABS Take 2,000 Units by mouth daily.   Yes Historical  Provider, MD  folic acid (FOLVITE) 384 MCG tablet Take 400 mcg by mouth daily.   Yes Historical Provider, MD  lisinopril (PRINIVIL,ZESTRIL) 20 MG tablet Take 1 tablet (20 mg total) by mouth daily. 11/23/13  Yes Vernie Shanks, MD  pantoprazole (PROTONIX) 40 MG tablet Take 1 tablet (40 mg total) by mouth daily. 11/23/13  Yes Vernie Shanks, MD  PROCTOFOAM [facility] rectal foam APPLY ONE APPLICATORFUL RECTALLY TWICE DAILY 02/07/14  Yes Erroll Luna, MD  vitamin B-12 (CYANOCOBALAMIN) 1000 MCG tablet Take  1,000 mcg by mouth daily.   Yes Historical Provider, MD    Allergies:  Allergies  Allergen Reactions  . Sulfonamide Derivatives     REACTION: rash    History   Social History  . Marital Status: Married    Spouse Name: N/A    Number of Children: N/A  . Years of Education: N/A   Occupational History  . Not on file.   Social History Main Topics  . Smoking status: Former Smoker    Quit date: 09/18/1981  . Smokeless tobacco: Never Used  . Alcohol Use: No  . Drug Use: No  . Sexual Activity: Not on file   Other Topics Concern  . Not on file   Social History Narrative  . No narrative on file     Review of Systems: Constitutional: negative for chills, fever, night sweats, weight changes, or fatigue  HEENT: negative for vision changes, hearing loss, congestion, rhinorrhea, ST, epistaxis, or sinus pressure Cardiovascular: negative for chest pain or palpitations Respiratory: negative for hemoptysis, wheezing, shortness of breath, or cough Abdominal: negative for abdominal pain, nausea, vomiting, diarrhea, or constipation Dermatological: negative for rash Neurologic: negative for headache, dizziness, or syncope All other systems reviewed and are otherwise negative with the exception to those above and in the HPI.   Physical Exam: Blood pressure 132/72, pulse 73, temperature 97.9 F (36.6 C), temperature source Oral, height 5\' 7"  (1.702 m), weight 203 lb (92.08 kg)., Body mass index is 31.79 kg/(m^2). General: Well developed, well nourished, in no acute distress. Head: Normocephalic, atraumatic, eyes without discharge, sclera non-icteric, nares are without discharge. Bilateral auditory canals clear, TM's are without perforation, pearly grey and translucent with reflective cone of light bilaterally. Oral cavity moist, posterior pharynx without exudate, erythema, peritonsillar abscess, or post nasal drip.  Neck: Supple. No thyromegaly. Full ROM. No lymphadenopathy. Lungs: Clear  bilaterally to auscultation without wheezes, rales, or rhonchi. Breathing is unlabored. Heart: RRR with S1 S2. No murmurs, rubs, or gallops appreciated. Abdomen: Soft, non-tender, non-distended with normoactive bowel sounds. No hepatomegaly. No rebound/guarding. No obvious abdominal masses. Msk:  Strength and tone normal for age. Extremities/Skin: Warm and dry. No clubbing or cyanosis. No edema. No rashes or suspicious lesions. Neuro: Alert and oriented X 3. Moves all extremities spontaneously. Gait is normal. CNII-XII grossly in tact. Psych:  Responds to questions appropriately with a normal affect.   Labs:   ASSESSMENT AND PLAN:  73 y.o. year old male with    05/05/2014 9:06 AM

## 2014-05-12 ENCOUNTER — Other Ambulatory Visit (HOSPITAL_BASED_OUTPATIENT_CLINIC_OR_DEPARTMENT_OTHER): Payer: Medicare Other

## 2014-05-12 ENCOUNTER — Telehealth: Payer: Self-pay | Admitting: *Deleted

## 2014-05-12 DIAGNOSIS — D509 Iron deficiency anemia, unspecified: Secondary | ICD-10-CM

## 2014-05-12 LAB — CBC & DIFF AND RETIC
BASO%: 0.3 % (ref 0.0–2.0)
Basophils Absolute: 0 10*3/uL (ref 0.0–0.1)
EOS ABS: 0.2 10*3/uL (ref 0.0–0.5)
EOS%: 2.7 % (ref 0.0–7.0)
HEMATOCRIT: 38.1 % — AB (ref 38.4–49.9)
HGB: 12.5 g/dL — ABNORMAL LOW (ref 13.0–17.1)
IMMATURE RETIC FRACT: 10.2 % (ref 3.00–10.60)
LYMPH#: 2.5 10*3/uL (ref 0.9–3.3)
LYMPH%: 33.6 % (ref 14.0–49.0)
MCH: 31.9 pg (ref 27.2–33.4)
MCHC: 32.8 g/dL (ref 32.0–36.0)
MCV: 97.2 fL (ref 79.3–98.0)
MONO#: 0.8 10*3/uL (ref 0.1–0.9)
MONO%: 10.6 % (ref 0.0–14.0)
NEUT%: 52.8 % (ref 39.0–75.0)
NEUTROS ABS: 3.9 10*3/uL (ref 1.5–6.5)
PLATELETS: 337 10*3/uL (ref 140–400)
RBC: 3.92 10*6/uL — AB (ref 4.20–5.82)
RDW: 15.5 % — ABNORMAL HIGH (ref 11.0–14.6)
RETIC %: 1.62 % (ref 0.80–1.80)
Retic Ct Abs: 63.5 10*3/uL (ref 34.80–93.90)
WBC: 7.4 10*3/uL (ref 4.0–10.3)

## 2014-05-12 LAB — HOLD TUBE, BLOOD BANK

## 2014-05-12 LAB — FERRITIN CHCC: Ferritin: 121 ng/ml (ref 22–316)

## 2014-05-12 NOTE — Telephone Encounter (Signed)
Informed wife of Ferritin level and no need for IV iron.  Keep next appt on 11/05 as scheduled. She verbalized understanding.

## 2014-05-12 NOTE — Telephone Encounter (Signed)
Message copied by Cathlean Cower on Thu May 12, 2014 11:50 AM ------      Message from: [facility], [place]: Thu May 12, 2014 11:39 AM      Regarding: good iron study       No need iv iron      ----- Message -----         From: [facility] Interface         Sent: 05/12/2014  10:13 AM           To: Loe Lark, MD                   ------

## 2014-06-09 ENCOUNTER — Ambulatory Visit (HOSPITAL_BASED_OUTPATIENT_CLINIC_OR_DEPARTMENT_OTHER): Payer: Medicare Other

## 2014-06-09 ENCOUNTER — Ambulatory Visit (HOSPITAL_BASED_OUTPATIENT_CLINIC_OR_DEPARTMENT_OTHER): Payer: Medicare Other | Admitting: Hematology and Oncology

## 2014-06-09 ENCOUNTER — Telehealth (INDEPENDENT_AMBULATORY_CARE_PROVIDER_SITE_OTHER): Payer: Self-pay

## 2014-06-09 ENCOUNTER — Encounter: Payer: Self-pay | Admitting: Hematology and Oncology

## 2014-06-09 ENCOUNTER — Telehealth: Payer: Self-pay | Admitting: Hematology and Oncology

## 2014-06-09 ENCOUNTER — Other Ambulatory Visit (HOSPITAL_BASED_OUTPATIENT_CLINIC_OR_DEPARTMENT_OTHER): Payer: Medicare Other

## 2014-06-09 VITALS — BP 115/57 | HR 80 | Temp 98.4°F | Resp 18 | Ht 67.0 in | Wt 204.1 lb

## 2014-06-09 DIAGNOSIS — D509 Iron deficiency anemia, unspecified: Secondary | ICD-10-CM

## 2014-06-09 DIAGNOSIS — K648 Other hemorrhoids: Secondary | ICD-10-CM

## 2014-06-09 LAB — CBC & DIFF AND RETIC
BASO%: 0.3 % (ref 0.0–2.0)
Basophils Absolute: 0 10*3/uL (ref 0.0–0.1)
EOS%: 3.4 % (ref 0.0–7.0)
Eosinophils Absolute: 0.2 10*3/uL (ref 0.0–0.5)
HCT: 29.5 % — ABNORMAL LOW (ref 38.4–49.9)
HGB: 9.7 g/dL — ABNORMAL LOW (ref 13.0–17.1)
Immature Retic Fract: 16.7 % — ABNORMAL HIGH (ref 3.00–10.60)
LYMPH#: 1.7 10*3/uL (ref 0.9–3.3)
LYMPH%: 24.3 % (ref 14.0–49.0)
MCH: 31.9 pg (ref 27.2–33.4)
MCHC: 32.9 g/dL (ref 32.0–36.0)
MCV: 97 fL (ref 79.3–98.0)
MONO#: 1.1 10*3/uL — ABNORMAL HIGH (ref 0.1–0.9)
MONO%: 15.6 % — AB (ref 0.0–14.0)
NEUT#: 3.9 10*3/uL (ref 1.5–6.5)
NEUT%: 56.4 % (ref 39.0–75.0)
NRBC: 0 % (ref 0–0)
Platelets: 338 10*3/uL (ref 140–400)
RBC: 3.04 10*6/uL — AB (ref 4.20–5.82)
RDW: 14.8 % — AB (ref 11.0–14.6)
RETIC %: 2.33 % — AB (ref 0.80–1.80)
RETIC CT ABS: 70.83 10*3/uL (ref 34.80–93.90)
WBC: 6.8 10*3/uL (ref 4.0–10.3)

## 2014-06-09 LAB — HOLD TUBE, BLOOD BANK

## 2014-06-09 LAB — FERRITIN CHCC: FERRITIN: 42 ng/mL (ref 22–316)

## 2014-06-09 MED ORDER — SODIUM CHLORIDE 0.9 % IV SOLN
1020.0000 mg | Freq: Once | INTRAVENOUS | Status: AC
  Administered 2014-06-09: 1020 mg via INTRAVENOUS
  Filled 2014-06-09: qty 34

## 2014-06-09 MED ORDER — SODIUM CHLORIDE 0.9 % IV SOLN
Freq: Once | INTRAVENOUS | Status: AC
  Administered 2014-06-09: 10:00:00 via INTRAVENOUS

## 2014-06-09 NOTE — Patient Instructions (Signed)

## 2014-06-09 NOTE — Assessment & Plan Note (Signed)
I recommend general surgery referral to fix his hemorrhoids.

## 2014-06-09 NOTE — Progress Notes (Signed)
Paul [name]  Paul Harada, MD SUMMARY OF HEMATOLOGIC HISTORY: This is a pleasant gentleman who has extensive workup in 2010 for anemia. He had EGD colonoscopy and capsule endoscopy that came back negative. Bone marrow aspirate and biopsy was negative. The patient was also diagnosed with prostate cancer and underwent prostatectomy. He never require any adjuvant treatment. In December 2014, he was found to have significant iron deficiency anemia and received intravenous iron infusion In January 2015, repeat EGD and colonoscopy revealed internal hemorrhoids. Between December 2014 to November 2015, the patient received 6 bags of intravenous iron infusion.  INTERVAL HISTORY: Paul [name] 73 y.o. male returns for Further follow-up. He complained of fatigue. He complained of intermittent hematochezia. The patient denies any recent signs or symptoms of bleeding such as spontaneous epistaxis, hematuria or hemoptysis.  I have reviewed the past medical history, past surgical history, social history and family history with the patient and they are unchanged from previous [name].  ALLERGIES:  is allergic to sulfonamide derivatives.  MEDICATIONS:  Current Outpatient Prescriptions  Medication Sig Dispense Refill  . amLODipine (NORVASC) 5 MG tablet Take 1 tablet (5 mg total) by mouth daily. 90 tablet 3  . aspirin 81 MG tablet Take 81 mg by mouth daily.    Marland Kitchen atorvastatin (LIPITOR) 40 MG tablet Take 1 tablet (40 mg total) by mouth daily. 90 tablet 3  . Cholecalciferol (VITAMIN D3) 2000 UNITS TABS Take 2,000 Units by mouth daily.    . folic acid (FOLVITE) 295 MCG tablet Take 400 mcg by mouth daily.    Marland Kitchen lisinopril (PRINIVIL,ZESTRIL) 20 MG tablet Take 1 tablet (20 mg total) by mouth daily. 90 tablet 3  . pantoprazole (PROTONIX) 40 MG tablet Take 1 tablet (40 mg total) by mouth daily. 90 tablet 3  . vitamin B-12 (CYANOCOBALAMIN) 1000 MCG tablet Take 1,000 mcg by  mouth daily.     No current facility-administered medications for this visit.     REVIEW OF SYSTEMS:   Constitutional: Denies fevers, chills or night sweats Eyes: Denies blurriness of vision Ears, nose, mouth, throat, and face: Denies mucositis or sore throat Respiratory: Denies cough, dyspnea or wheezes Cardiovascular: Denies palpitation, chest discomfort or lower extremity swelling Gastrointestinal:  Denies nausea, heartburn or change in bowel habits Skin: Denies abnormal skin rashes Lymphatics: Denies new lymphadenopathy or easy bruising Neurological:Denies numbness, tingling or new weaknesses Behavioral/Psych: Mood is stable, no new changes  All other systems were reviewed with the patient and are negative.  PHYSICAL EXAMINATION: ECOG PERFORMANCE STATUS: 1 - Symptomatic but completely ambulatory  Filed Vitals:   06/09/14 0940  BP: 115/57  Pulse: 80  Temp: 98.4 F (36.9 C)  Resp: 18   Filed Weights   06/09/14 0940  Weight: 204 lb 1.6 oz (92.579 kg)    GENERAL:alert, no distress and comfortable SKIN: skin color is pale, texture, turgor are normal, no rashes or significant lesions EYES: normal, Conjunctiva are pale and non-injected, sclera clear Musculoskeletal:no cyanosis of digits and no clubbing  NEURO: alert & oriented x 3 with fluent speech, no focal motor/sensory deficits  LABORATORY DATA:  I have reviewed the data as listed Results for orders placed or performed in visit on 06/09/14 (from the past 48 hour(s))  Ferritin     Status: None   Collection Time: 06/09/14  9:19 AM  Result Value Ref Range   Ferritin 42 22 - 316 ng/ml  CBC & Diff and Retic     Status: Abnormal  Collection Time: 06/09/14  9:19 AM  Result Value Ref Range   WBC 6.8 4.0 - 10.3 10e3/uL   NEUT# 3.9 1.5 - 6.5 10e3/uL   HGB 9.7 (L) 13.0 - 17.1 g/dL   HCT 29.5 (L) 38.4 - 49.9 %   Platelets 338 140 - 400 10e3/uL   MCV 97.0 79.3 - 98.0 fL   MCH 31.9 27.2 - 33.4 pg   MCHC 32.9 32.0 - 36.0  g/dL   RBC 3.04 (L) 4.20 - 5.82 10e6/uL   RDW 14.8 (H) 11.0 - 14.6 %   lymph# 1.7 0.9 - 3.3 10e3/uL   MONO# 1.1 (H) 0.1 - 0.9 10e3/uL   Eosinophils Absolute 0.2 0.0 - 0.5 10e3/uL   Basophils Absolute 0.0 0.0 - 0.1 10e3/uL   NEUT% 56.4 39.0 - 75.0 %   LYMPH% 24.3 14.0 - 49.0 %   MONO% 15.6 (H) 0.0 - 14.0 %   EOS% 3.4 0.0 - 7.0 %   BASO% 0.3 0.0 - 2.0 %   nRBC 0 0 - 0 %   Retic % 2.33 (H) 0.80 - 1.80 %   Retic Ct Abs 70.83 34.80 - 93.90 10e3/uL   Immature Retic Fract 16.70 (H) 3.00 - 10.60 %  Hold Tube, Blood Bank     Status: None   Collection Time: 06/09/14  9:19 AM  Result Value Ref Range   Hold Tube, Blood Bank Blood Bank Order Cancelled     Lab Results  Component Value Date   WBC 6.8 06/09/2014   HGB 9.7* 06/09/2014   HCT 29.5* 06/09/2014   MCV 97.0 06/09/2014   PLT 338 06/09/2014   ASSESSMENT & PLAN:  Iron deficiency anemia The patient has iron deficiency anemia from GI bleed until proven otherwise. He is seen persistent hemorrhoidal bleeding on a regular basis. He has received 5 bags of iron infusion over the last 10 months. I recommend referral back to Gen. Surgery for hemorrhoidectomy. Another thing that we discussed today would be to consider possibility of repeating endoscopies to look for other sources of bleeding. I will contact his surgeon for an opinion first and proceed from there on. I recommend repeat intravenous iron infusion today. We discussed some of the risks, benefits, and alternatives of intravenous iron infusions. The patient is symptomatic from anemia and the iron level is critically low. He tolerated oral iron supplement poorly and desires to achieved higher levels of iron faster for adequate hematopoesis. Some of the side-effects to be expected including risks of infusion reactions, phlebitis, headaches, nausea and fatigue.  The patient is willing to proceed. Patient education material was dispensed.  Goal is to keep ferritin level greater than  100   Hemorrhoids, internal I recommend general surgery referral to fix his hemorrhoids.   All questions were answered. The patient knows to call the clinic with any problems, questions or concerns. No barriers to learning was detected.  I spent 25 minutes counseling the patient face to face. The total time spent in the appointment was 30 minutes and more than 50% was on counseling.     [place], [place], MD 06/09/2014 11:00 AM

## 2014-06-09 NOTE — Assessment & Plan Note (Signed)
The patient has iron deficiency anemia from GI bleed until proven otherwise. He is seen persistent hemorrhoidal bleeding on a regular basis. He has received 5 bags of iron infusion over the last 10 months. I recommend referral back to Gen. Surgery for hemorrhoidectomy. Another thing that we discussed today would be to consider possibility of repeating endoscopies to look for other sources of bleeding. I will contact his surgeon for an opinion first and proceed from there on. I recommend repeat intravenous iron infusion today. We discussed some of the risks, benefits, and alternatives of intravenous iron infusions. The patient is symptomatic from anemia and the iron level is critically low. He tolerated oral iron supplement poorly and desires to achieved higher levels of iron faster for adequate hematopoesis. Some of the side-effects to be expected including risks of infusion reactions, phlebitis, headaches, nausea and fatigue.  The patient is willing to proceed. Patient education material was dispensed.  Goal is to keep ferritin level greater than 100

## 2014-06-09 NOTE — Telephone Encounter (Signed)
gv adn printed appt sched and avs for pt for Dec thru May

## 2014-06-09 NOTE — Telephone Encounter (Signed)
Called pt with appt 

## 2014-06-09 NOTE — Telephone Encounter (Signed)
-----   Message from Erroll Luna, MD sent at 06/09/2014  1:23 PM EST ----- Regarding: FW: mutual patient Go ahead and set up for office visit please.  ----- Message -----    From: Langan Lark, MD    Sent: 06/09/2014   9:57 AM      To: Erroll Luna, MD Subject: mutual patient                                 Dr. Brantley Stage,  This mutual patient is still having hemorrhoidal bleeding. I'm giving him his 6th bag of iron today (since last December). He has iron deficiency anemia and the most obvious source is lower GI bleed. I recommend hemorrhoidal surgery. If you felt the patient needs repeat EGD/colonoscopy/capsule endoscopy, I will refer him to GI first. Appreciate your input in this case. Thanks, Ni

## 2014-06-13 ENCOUNTER — Ambulatory Visit (INDEPENDENT_AMBULATORY_CARE_PROVIDER_SITE_OTHER): Payer: Self-pay | Admitting: Surgery

## 2014-06-13 NOTE — H&P (Signed)
Paul [name]. Gernert 06/13/2014 9:32 AM Location: [facility] Patient #: [id number] DOB: [birth year] Married / Language: Paul [name] / Race: White Male History of Present Illness Marcello Moores A. Mildred Bollard MD; 06/13/2014 9:55 AM) Patient words: hems    Patient sat at the request Madaris Lark, MD for hemorrhoid disease. Patient is being treated for anemia. He has been worked up with upper lower endoscopy and has required higher transfusions. He has intermittent rectal bleeding. This does not appear cavity. It is not with every bowel movement. He does notice blood when he wipes but not after each bowel movement. He has intermittent bouts of blood in the commode. He is unclear how often this happens it is not with every bowel movement.he states he spent 1 hour having a bowel movement. He likes to read on the commode.  Pt continues to require iron and has intermittent bleeding. Earlier GI work up negative. Pt returns to consider hemorrhoidectomy.  The patient is a 73 year old male   Other Problems Marjean Donna, [place]; 06/13/2014 9:33 AM) Arthritis Back Pain Gastroesophageal Reflux Disease Hemorrhoids High blood pressure Hypercholesterolemia Prostate Cancer Transfusion history  Past Surgical History Marjean Donna, [place]; 06/13/2014 9:33 AM) Prostate Surgery - Removal  Diagnostic Studies History Marjean Donna, CMA; 06/13/2014 9:33 AM) Colonoscopy within last year  Allergies Davy Pique Bynum, CMA; 06/13/2014 9:34 AM) Sulfa Antibiotics  Medication History (Sonya Bynum, CMA; 06/13/2014 9:36 AM) Aspirin (81MG  Tablet, Oral) Active. AmLODIPine Besylate (5MG  Tablet, Oral) Active. Atorvastatin Calcium (40MG  Tablet, Oral) Active. Lisinopril (20MG  Tablet, Oral) Active. Folic Acid (384YKZ Tablet, Oral) Active. Pantoprazole Sodium (40MG  Tablet DR, Oral) Active. Vitamin B-12 (1000MCG Tablet, Oral) Active.     Review of Systems Davy Pique Bynum CMA; 06/13/2014 9:33 AM) General Not Present-  Appetite Loss, Chills, Fatigue, Fever, Night Sweats, Weight Gain and Weight Loss. Skin Not Present- Change in Wart/Mole, Dryness, Hives, Jaundice, New Lesions, Non-Healing Wounds, Rash and Ulcer. HEENT Not Present- Earache, Hearing Loss, Hoarseness, Nose Bleed, Oral Ulcers, Ringing in the Ears, Seasonal Allergies, Sinus Pain, Sore Throat, Visual Disturbances, Wears glasses/contact lenses and Yellow Eyes. Respiratory Present- Snoring. Not Present- Bloody sputum, Chronic Cough, Difficulty Breathing and Wheezing. Cardiovascular Not Present- Chest Pain, Difficulty Breathing Lying Down, Leg Cramps, Palpitations, Rapid Heart Rate, Shortness of Breath and Swelling of Extremities. Gastrointestinal Present- Bloody Stool. Not Present- Abdominal Pain, Bloating, Change in Bowel Habits, Chronic diarrhea, Constipation, Difficulty Swallowing, Excessive gas, Gets full quickly at meals, Hemorrhoids, Indigestion, Nausea, Rectal Pain and Vomiting. Male Genitourinary Present- Urine Leakage. Not Present- Blood in Urine, Change in Urinary Stream, Frequency, Impotence, Nocturia, Painful Urination and Urgency. Musculoskeletal Present- Back Pain. Not Present- Joint Pain, Joint Stiffness, Muscle Pain, Muscle Weakness and Swelling of Extremities. Neurological Present- Weakness. Not Present- Decreased Memory, Fainting, Headaches, Numbness, Seizures, Tingling, Tremor and Trouble walking. Psychiatric Not Present- Anxiety, Bipolar, Change in Sleep Pattern, Depression, Fearful and Frequent crying. Endocrine Not Present- Cold Intolerance, Excessive Hunger, Hair Changes, Heat Intolerance, Hot flashes and New Diabetes. Hematology Not Present- Easy Bruising, Excessive bleeding, Gland problems, HIV and Persistent Infections.  Vitals (Sonya Bynum CMA; 06/13/2014 9:37 AM) 06/13/2014 9:36 AM Weight: 202 lb Height: 68in Body Surface Area: 2.1 m Body Mass Index: 30.71 kg/m Temp.: 89F(Temporal)  Pulse: 74 (Regular)  BP: 126/70  (Sitting, Left Arm, Standard)     Physical Exam (Jalessa Peyser A. Alin Chavira MD; 06/13/2014 9:56 AM)  General Mental Status-Alert. General Appearance-Consistent with stated age. Hydration-Well hydrated. Voice-Normal.  Eye Eyeball - Bilateral-Extraocular movements intact. Sclera/Conjunctiva - Bilateral-No scleral icterus.  Chest  and Lung Exam Chest and lung exam reveals -quiet, even and easy respiratory effort with no use of accessory muscles and on auscultation, normal breath sounds, no adventitious sounds and normal vocal resonance. Inspection Chest Wall - Normal. Back - normal.  Abdomen Inspection Inspection of the abdomen reveals - No Hernias. Skin - Scar - no surgical scars. Palpation/Percussion Palpation and Percussion of the abdomen reveal - Soft, Non Tender, No Rebound tenderness, No Rigidity (guarding) and No hepatosplenomegaly. Auscultation Auscultation of the abdomen reveals - Bowel sounds normal.  Rectal Note: no reexamined today   Musculoskeletal Normal Exam - Left-Upper Extremity Strength Normal and Lower Extremity Strength Normal. Normal Exam - Right-Upper Extremity Strength Normal and Lower Extremity Strength Normal.  Lymphatic Head & Neck  General Head & Neck Lymphatics: Bilateral - Description - Normal. Axillary  General Axillary Region: Bilateral - Description - Normal. Tenderness - Non Tender. Femoral & Inguinal  Generalized Femoral & Inguinal Lymphatics: Bilateral - Description - Normal. Tenderness - Non Tender.    Assessment & Plan (Jrake Rodriquez A. Emylia Latella MD; 06/13/2014 9:57 AM)  HEMORRHOIDS, INTERNAL (455.0  K64.8) Impression: discussed that hemorrhoids unlikely source of anemia and that hemorrhoidectomy may or may not help. Not suitable for office based procedures. Discussed complications of bleeding infection incontinence organ injury anal stenosis and other possible injuries. He agrees to proceed because no other bleeding source  identified.  Current Plans Pt Education - [facility] Rectal Surgery HCI [facility] Consent - BASIC (Gross): discussed with patient and provided information. Pt Education - [facility] Hemorrhoids (AT)

## 2014-06-13 NOTE — H&P (Signed)
Talmage Coin. Knisely 06/13/2014 9:32 AM Location: [facility] Patient #: [id number] DOB: Oct 24, 1940 Married / Language: [name] / Race: White Male History of Present Illness Marcello Moores A. Bob Daversa MD; 06/13/2014 9:55 AM) Patient words: hems    Patient sat at the request Kobel Lark, MD for hemorrhoid disease. Patient is being treated for anemia. He has been worked up with upper lower endoscopy and has required higher transfusions. He has intermittent rectal bleeding. This does not appear cavity. It is not with every bowel movement. He does notice blood when he wipes but not after each bowel movement. He has intermittent bouts of blood in the commode. He is unclear how often this happens it is not with every bowel movement.he states he spent 1 hour having a bowel movement. He likes to read on the commode.  Pt continues to require iron and has intermittent bleeding. Earlier GI work up negative. Pt returns to consider hemorrhoidectomy.  The patient is a 73 year old male   Other Problems Marjean Donna, [place]; 06/13/2014 9:33 AM) Arthritis Back Pain Gastroesophageal Reflux Disease Hemorrhoids High blood pressure Hypercholesterolemia Prostate Cancer Transfusion history  Past Surgical History Marjean Donna, [place]; 06/13/2014 9:33 AM) Prostate Surgery - Removal  Diagnostic Studies History Marjean Donna, CMA; 06/13/2014 9:33 AM) Colonoscopy within last year  Allergies Davy Pique Bynum, CMA; 06/13/2014 9:34 AM) Sulfa Antibiotics  Medication History (Sonya Bynum, CMA; 06/13/2014 9:36 AM) Aspirin (81MG  Tablet, Oral) Active. AmLODIPine Besylate (5MG  Tablet, Oral) Active. Atorvastatin Calcium (40MG  Tablet, Oral) Active. Lisinopril (20MG  Tablet, Oral) Active. Folic Acid (761YWV Tablet, Oral) Active. Pantoprazole Sodium (40MG  Tablet DR, Oral) Active. Vitamin B-12 (1000MCG Tablet, Oral) Active.     Review of Systems Davy Pique Bynum CMA; 06/13/2014 9:33 AM) General Not Present-  Appetite Loss, Chills, Fatigue, Fever, Night Sweats, Weight Gain and Weight Loss. Skin Not Present- Change in Wart/Mole, Dryness, Hives, Jaundice, New Lesions, Non-Healing Wounds, Rash and Ulcer. HEENT Not Present- Earache, Hearing Loss, Hoarseness, Nose Bleed, Oral Ulcers, Ringing in the Ears, Seasonal Allergies, Sinus Pain, Sore Throat, Visual Disturbances, Wears glasses/contact lenses and Yellow Eyes. Respiratory Present- Snoring. Not Present- Bloody sputum, Chronic Cough, Difficulty Breathing and Wheezing. Cardiovascular Not Present- Chest Pain, Difficulty Breathing Lying Down, Leg Cramps, Palpitations, Rapid Heart Rate, Shortness of Breath and Swelling of Extremities. Gastrointestinal Present- Bloody Stool. Not Present- Abdominal Pain, Bloating, Change in Bowel Habits, Chronic diarrhea, Constipation, Difficulty Swallowing, Excessive gas, Gets full quickly at meals, Hemorrhoids, Indigestion, Nausea, Rectal Pain and Vomiting. Male Genitourinary Present- Urine Leakage. Not Present- Blood in Urine, Change in Urinary Stream, Frequency, Impotence, Nocturia, Painful Urination and Urgency. Musculoskeletal Present- Back Pain. Not Present- Joint Pain, Joint Stiffness, Muscle Pain, Muscle Weakness and Swelling of Extremities. Neurological Present- Weakness. Not Present- Decreased Memory, Fainting, Headaches, Numbness, Seizures, Tingling, Tremor and Trouble walking. Psychiatric Not Present- Anxiety, Bipolar, Change in Sleep Pattern, Depression, Fearful and Frequent crying. Endocrine Not Present- Cold Intolerance, Excessive Hunger, Hair Changes, Heat Intolerance, Hot flashes and New Diabetes. Hematology Not Present- Easy Bruising, Excessive bleeding, Gland problems, HIV and Persistent Infections.  Vitals (Sonya Bynum CMA; 06/13/2014 9:37 AM) 06/13/2014 9:36 AM Weight: 202 lb Height: 68in Body Surface Area: 2.1 m Body Mass Index: 30.71 kg/m Temp.: 53F(Temporal)  Pulse: 74 (Regular)  BP: 126/70  (Sitting, Left Arm, Standard)     Physical Exam (Dannya Pitkin A. Rilya Longo MD; 06/13/2014 9:56 AM)  General Mental Status-Alert. General Appearance-Consistent with stated age. Hydration-Well hydrated. Voice-Normal.  Eye Eyeball - Bilateral-Extraocular movements intact. Sclera/Conjunctiva - Bilateral-No scleral icterus.  Chest  and Lung Exam Chest and lung exam reveals -quiet, even and easy respiratory effort with no use of accessory muscles and on auscultation, normal breath sounds, no adventitious sounds and normal vocal resonance. Inspection Chest Wall - Normal. Back - normal.  Abdomen Inspection Inspection of the abdomen reveals - No Hernias. Skin - Scar - no surgical scars. Palpation/Percussion Palpation and Percussion of the abdomen reveal - Soft, Non Tender, No Rebound tenderness, No Rigidity (guarding) and No hepatosplenomegaly. Auscultation Auscultation of the abdomen reveals - Bowel sounds normal.  Rectal Note: no reexamined today   Musculoskeletal Normal Exam - Left-Upper Extremity Strength Normal and Lower Extremity Strength Normal. Normal Exam - Right-Upper Extremity Strength Normal and Lower Extremity Strength Normal.  Lymphatic Head & Neck  General Head & Neck Lymphatics: Bilateral - Description - Normal. Axillary  General Axillary Region: Bilateral - Description - Normal. Tenderness - Non Tender. Femoral & Inguinal  Generalized Femoral & Inguinal Lymphatics: Bilateral - Description - Normal. Tenderness - Non Tender.    Assessment & Plan (Avannah Decker A. Jaziyah Gradel MD; 06/13/2014 9:57 AM)  HEMORRHOIDS, INTERNAL (455.0  K64.8) Impression: discussed that hemorrhoids unlikely source of anemia and that hemorrhoidectomy may or may not help. Not suitable for office based procedures. Discussed complications of bleeding infection incontinence organ injury anal stenosis and other possible injuries. He agrees to proceed because no other bleeding source  identified.  Current Plans Pt Education - [facility] Rectal Surgery HCI [facility]CS Consent - BASIC (Gross): discussed with patient and provided information. Pt Education - [facility] Hemorrhoids (AT)

## 2014-06-27 ENCOUNTER — Encounter (HOSPITAL_BASED_OUTPATIENT_CLINIC_OR_DEPARTMENT_OTHER): Payer: Self-pay | Admitting: *Deleted

## 2014-06-27 NOTE — Progress Notes (Signed)
Pt has been bleeding so much-has had transfusions at [facility] Will come in for labs 08/29/13-

## 2014-06-29 ENCOUNTER — Other Ambulatory Visit: Payer: Self-pay

## 2014-06-29 ENCOUNTER — Encounter (HOSPITAL_BASED_OUTPATIENT_CLINIC_OR_DEPARTMENT_OTHER)
Admission: RE | Admit: 2014-06-29 | Discharge: 2014-06-29 | Disposition: A | Discharge status: Home / Self Care | Payer: Medicare Other | Source: Ambulatory Visit | Attending: Surgery | Admitting: Surgery

## 2014-06-29 DIAGNOSIS — K644 Residual hemorrhoidal skin tags: Secondary | ICD-10-CM | POA: Diagnosis not present

## 2014-06-29 DIAGNOSIS — I10 Essential (primary) hypertension: Secondary | ICD-10-CM | POA: Diagnosis not present

## 2014-06-29 DIAGNOSIS — K219 Gastro-esophageal reflux disease without esophagitis: Secondary | ICD-10-CM | POA: Diagnosis not present

## 2014-06-29 DIAGNOSIS — E780 Pure hypercholesterolemia: Secondary | ICD-10-CM | POA: Diagnosis not present

## 2014-06-29 DIAGNOSIS — Z87891 Personal history of nicotine dependence: Secondary | ICD-10-CM | POA: Diagnosis not present

## 2014-06-29 DIAGNOSIS — M1990 Unspecified osteoarthritis, unspecified site: Secondary | ICD-10-CM | POA: Diagnosis not present

## 2014-06-29 DIAGNOSIS — Z01810 Encounter for preprocedural cardiovascular examination: Secondary | ICD-10-CM | POA: Diagnosis present

## 2014-06-29 DIAGNOSIS — Z7982 Long term (current) use of aspirin: Secondary | ICD-10-CM | POA: Diagnosis not present

## 2014-06-29 DIAGNOSIS — K642 Third degree hemorrhoids: Secondary | ICD-10-CM | POA: Diagnosis present

## 2014-06-29 LAB — CBC WITH DIFFERENTIAL/PLATELET
BASOS ABS: 0 10*3/uL (ref 0.0–0.1)
Basophils Relative: 1 % (ref 0–1)
Eosinophils Absolute: 0.2 10*3/uL (ref 0.0–0.7)
Eosinophils Relative: 3 % (ref 0–5)
HCT: 40.6 % (ref 39.0–52.0)
Hemoglobin: 12.9 g/dL — ABNORMAL LOW (ref 13.0–17.0)
LYMPHS PCT: 42 % (ref 12–46)
Lymphs Abs: 2.3 10*3/uL (ref 0.7–4.0)
MCH: 31.8 pg (ref 26.0–34.0)
MCHC: 31.8 g/dL (ref 30.0–36.0)
MCV: 100 fL (ref 78.0–100.0)
Monocytes Absolute: 0.7 10*3/uL (ref 0.1–1.0)
Monocytes Relative: 12 % (ref 3–12)
NEUTROS ABS: 2.4 10*3/uL (ref 1.7–7.7)
NEUTROS PCT: 42 % — AB (ref 43–77)
PLATELETS: 382 10*3/uL (ref 150–400)
RBC: 4.06 MIL/uL — ABNORMAL LOW (ref 4.22–5.81)
RDW: 16.2 % — AB (ref 11.5–15.5)
WBC: 5.5 10*3/uL (ref 4.0–10.5)

## 2014-06-29 LAB — COMPREHENSIVE METABOLIC PANEL
ALT: 17 U/L (ref 0–53)
AST: 22 U/L (ref 0–37)
Albumin: 3.8 g/dL (ref 3.5–5.2)
Alkaline Phosphatase: 121 U/L — ABNORMAL HIGH (ref 39–117)
Anion gap: 12 (ref 5–15)
BILIRUBIN TOTAL: 0.4 mg/dL (ref 0.3–1.2)
BUN: 16 mg/dL (ref 6–23)
CHLORIDE: 99 meq/L (ref 96–112)
CO2: 27 meq/L (ref 19–32)
CREATININE: 1.06 mg/dL (ref 0.50–1.35)
Calcium: 10 mg/dL (ref 8.4–10.5)
GFR calc Af Amer: 78 mL/min — ABNORMAL LOW (ref >90)
GFR calc non Af Amer: 68 mL/min — ABNORMAL LOW (ref >90)
Glucose, Bld: 130 mg/dL — ABNORMAL HIGH (ref 70–99)
Potassium: 4.4 mEq/L (ref 3.7–5.3)
SODIUM: 138 meq/L (ref 137–147)
Total Protein: 7.6 g/dL (ref 6.0–8.3)

## 2014-07-05 ENCOUNTER — Ambulatory Visit (HOSPITAL_BASED_OUTPATIENT_CLINIC_OR_DEPARTMENT_OTHER): Payer: Medicare Other | Admitting: Certified Registered"

## 2014-07-05 ENCOUNTER — Encounter (HOSPITAL_BASED_OUTPATIENT_CLINIC_OR_DEPARTMENT_OTHER): Payer: Self-pay | Admitting: *Deleted

## 2014-07-05 ENCOUNTER — Ambulatory Visit (HOSPITAL_BASED_OUTPATIENT_CLINIC_OR_DEPARTMENT_OTHER)
Admission: RE | Admit: 2014-07-05 | Discharge: 2014-07-05 | Disposition: A | Discharge status: Home / Self Care | Payer: Medicare Other | Source: Ambulatory Visit | Attending: Surgery | Admitting: Surgery

## 2014-07-05 ENCOUNTER — Encounter (HOSPITAL_BASED_OUTPATIENT_CLINIC_OR_DEPARTMENT_OTHER)
Admission: RE | Disposition: A | Discharge status: Home / Self Care | Payer: Self-pay | Source: Ambulatory Visit | Attending: Surgery

## 2014-07-05 DIAGNOSIS — K219 Gastro-esophageal reflux disease without esophagitis: Secondary | ICD-10-CM | POA: Diagnosis not present

## 2014-07-05 DIAGNOSIS — K642 Third degree hemorrhoids: Secondary | ICD-10-CM | POA: Diagnosis not present

## 2014-07-05 DIAGNOSIS — K644 Residual hemorrhoidal skin tags: Secondary | ICD-10-CM | POA: Insufficient documentation

## 2014-07-05 DIAGNOSIS — Z87891 Personal history of nicotine dependence: Secondary | ICD-10-CM | POA: Insufficient documentation

## 2014-07-05 DIAGNOSIS — M1990 Unspecified osteoarthritis, unspecified site: Secondary | ICD-10-CM | POA: Insufficient documentation

## 2014-07-05 DIAGNOSIS — E780 Pure hypercholesterolemia: Secondary | ICD-10-CM | POA: Insufficient documentation

## 2014-07-05 DIAGNOSIS — I10 Essential (primary) hypertension: Secondary | ICD-10-CM | POA: Insufficient documentation

## 2014-07-05 DIAGNOSIS — Z7982 Long term (current) use of aspirin: Secondary | ICD-10-CM | POA: Insufficient documentation

## 2014-07-05 HISTORY — PX: HEMORRHOID SURGERY: SHX153

## 2014-07-05 HISTORY — DX: Complete loss of teeth, unspecified cause, unspecified class: Z97.2

## 2014-07-05 HISTORY — DX: Presence of dental prosthetic device (complete) (partial): K08.109

## 2014-07-05 LAB — POCT HEMOGLOBIN-HEMACUE: HEMOGLOBIN: 14.7 g/dL (ref 13.0–17.0)

## 2014-07-05 SURGERY — HEMORRHOIDECTOMY
Anesthesia: General | Site: Rectum

## 2014-07-05 MED ORDER — CHLORHEXIDINE GLUCONATE 4 % EX LIQD: 1.0000 "application " | Freq: Once | CUTANEOUS | Status: DC

## 2014-07-05 MED ORDER — MIDAZOLAM HCL 2 MG/2ML IJ SOLN
INTRAMUSCULAR | Status: AC
  Filled 2014-07-05: qty 2

## 2014-07-05 MED ORDER — CEFAZOLIN SODIUM-DEXTROSE 2-3 GM-% IV SOLR
INTRAVENOUS | Status: AC
  Filled 2014-07-05: qty 50

## 2014-07-05 MED ORDER — HYDROMORPHONE HCL 1 MG/ML IJ SOLN
INTRAMUSCULAR | Status: AC
  Filled 2014-07-05: qty 1

## 2014-07-05 MED ORDER — OXYCODONE HCL 5 MG PO TABS
ORAL_TABLET | ORAL | Status: AC
  Filled 2014-07-05: qty 1

## 2014-07-05 MED ORDER — FENTANYL CITRATE 0.05 MG/ML IJ SOLN
INTRAMUSCULAR | Status: AC
  Filled 2014-07-05: qty 6

## 2014-07-05 MED ORDER — PROPOFOL 10 MG/ML IV BOLUS
INTRAVENOUS | Status: DC | PRN
  Administered 2014-07-05: 150 mg via INTRAVENOUS

## 2014-07-05 MED ORDER — DEXAMETHASONE SODIUM PHOSPHATE 4 MG/ML IJ SOLN
INTRAMUSCULAR | Status: DC | PRN
  Administered 2014-07-05: 10 mg via INTRAVENOUS

## 2014-07-05 MED ORDER — FENTANYL CITRATE 0.05 MG/ML IJ SOLN: 50.0000 ug | INTRAMUSCULAR | Status: DC | PRN

## 2014-07-05 MED ORDER — OXYCODONE-ACETAMINOPHEN 10-325 MG PO TABS: 1.0000 | ORAL_TABLET | ORAL | Status: DC | PRN

## 2014-07-05 MED ORDER — MEPERIDINE HCL 25 MG/ML IJ SOLN
6.2500 mg | INTRAMUSCULAR | Status: DC | PRN
Start: 2014-07-05 — End: 2014-07-05

## 2014-07-05 MED ORDER — BUPIVACAINE LIPOSOME 1.3 % IJ SUSP
INTRAMUSCULAR | Status: AC
  Filled 2014-07-05: qty 20

## 2014-07-05 MED ORDER — FENTANYL CITRATE 0.05 MG/ML IJ SOLN
INTRAMUSCULAR | Status: DC | PRN
  Administered 2014-07-05: 25 ug via INTRAVENOUS
  Administered 2014-07-05: 50 ug via INTRAVENOUS
  Administered 2014-07-05: 25 ug via INTRAVENOUS

## 2014-07-05 MED ORDER — CEFAZOLIN SODIUM-DEXTROSE 2-3 GM-% IV SOLR
2.0000 g | INTRAVENOUS | Status: AC
  Administered 2014-07-05: 2 g via INTRAVENOUS

## 2014-07-05 MED ORDER — SODIUM CHLORIDE 0.9 % IV SOLN
INTRAVENOUS | Status: DC | PRN
  Administered 2014-07-05: 30 mL

## 2014-07-05 MED ORDER — LIDOCAINE HCL 2 % EX GEL: 1.0000 "application " | CUTANEOUS | Status: DC | PRN

## 2014-07-05 MED ORDER — ONDANSETRON HCL 4 MG/2ML IJ SOLN
INTRAMUSCULAR | Status: DC | PRN
  Administered 2014-07-05: 4 mg via INTRAVENOUS

## 2014-07-05 MED ORDER — FLEET ENEMA 7-19 GM/118ML RE ENEM
1.0000 | ENEMA | Freq: Once | RECTAL | Status: AC
  Administered 2014-07-05: 1 via RECTAL

## 2014-07-05 MED ORDER — LACTATED RINGERS IV SOLN
INTRAVENOUS | Status: DC
  Administered 2014-07-05 (×2): via INTRAVENOUS

## 2014-07-05 MED ORDER — OXYCODONE HCL 5 MG/5ML PO SOLN: 5.0000 mg | Freq: Once | ORAL | Status: AC | PRN

## 2014-07-05 MED ORDER — HYDROMORPHONE HCL 1 MG/ML IJ SOLN
0.2500 mg | INTRAMUSCULAR | Status: DC | PRN
  Administered 2014-07-05 (×2): 0.5 mg via INTRAVENOUS

## 2014-07-05 MED ORDER — OXYCODONE HCL 5 MG PO TABS
5.0000 mg | ORAL_TABLET | Freq: Once | ORAL | Status: AC | PRN
  Administered 2014-07-05: 5 mg via ORAL

## 2014-07-05 MED ORDER — LIDOCAINE HCL (CARDIAC) 20 MG/ML IV SOLN
INTRAVENOUS | Status: DC | PRN
  Administered 2014-07-05: 60 mg via INTRAVENOUS

## 2014-07-05 MED ORDER — ONDANSETRON HCL 4 MG/2ML IJ SOLN: 4.0000 mg | Freq: Once | INTRAMUSCULAR | Status: DC | PRN

## 2014-07-05 MED ORDER — MIDAZOLAM HCL 2 MG/2ML IJ SOLN: 1.0000 mg | INTRAMUSCULAR | Status: DC | PRN

## 2014-07-05 MED ORDER — POLYETHYLENE GLYCOL 3350 17 GM/SCOOP PO POWD: 0.5000 | Freq: Every day | ORAL | Status: DC

## 2014-07-05 MED ORDER — MIDAZOLAM HCL 5 MG/5ML IJ SOLN
INTRAMUSCULAR | Status: DC | PRN
  Administered 2014-07-05: 2 mg via INTRAVENOUS

## 2014-07-05 MED ORDER — FLEET ENEMA 7-19 GM/118ML RE ENEM
ENEMA | RECTAL | Status: AC
  Filled 2014-07-05: qty 1

## 2014-07-05 SURGICAL SUPPLY — 47 items
APL SKNCLS STERI-STRIP NONHPOA (GAUZE/BANDAGES/DRESSINGS)
BENZOIN TINCTURE PRP APPL 2/3 (GAUZE/BANDAGES/DRESSINGS) ×1 IMPLANT
BLADE SURG 11 STRL SS (BLADE) IMPLANT
BLADE SURG 15 STRL LF DISP TIS (BLADE) ×1 IMPLANT
BLADE SURG 15 STRL SS (BLADE) ×2
BRIEF STRETCH FOR OB PAD LRG (UNDERPADS AND DIAPERS) ×1 IMPLANT
CANISTER SUCT 1200ML W/VALVE (MISCELLANEOUS) ×2 IMPLANT
COVER BACK TABLE 60X90IN (DRAPES) IMPLANT
COVER MAYO STAND STRL (DRAPES) IMPLANT
DECANTER SPIKE VIAL GLASS SM (MISCELLANEOUS) IMPLANT
DRAPE PED LAPAROTOMY (DRAPES) IMPLANT
DRAPE UTILITY XL STRL (DRAPES) ×2 IMPLANT
DRSG PAD ABDOMINAL 8X10 ST (GAUZE/BANDAGES/DRESSINGS) ×2 IMPLANT
ELECT COATED BLADE 2.86 ST (ELECTRODE) ×2 IMPLANT
ELECT REM PT RETURN 9FT ADLT (ELECTROSURGICAL) ×2
ELECTRODE REM PT RTRN 9FT ADLT (ELECTROSURGICAL) ×1 IMPLANT
GLOVE BIO SURGEON STRL SZ 6.5 (GLOVE) ×2 IMPLANT
GLOVE BIOGEL PI IND STRL 7.0 (GLOVE) IMPLANT
GLOVE BIOGEL PI IND STRL 8 (GLOVE) ×1 IMPLANT
GLOVE BIOGEL PI INDICATOR 7.0 (GLOVE) ×2
GLOVE BIOGEL PI INDICATOR 8 (GLOVE) ×1
GLOVE ECLIPSE 8.0 STRL XLNG CF (GLOVE) ×2 IMPLANT
GOWN STRL REUS W/ TWL LRG LVL3 (GOWN DISPOSABLE) ×2 IMPLANT
GOWN STRL REUS W/TWL LRG LVL3 (GOWN DISPOSABLE) ×4
HEMOSTAT SURGICEL 2X14 (HEMOSTASIS) IMPLANT
NDL HYPO 25X1 1.5 SAFETY (NEEDLE) ×1 IMPLANT
NEEDLE HYPO 25X1 1.5 SAFETY (NEEDLE) ×2 IMPLANT
PACK BASIN DAY SURGERY FS (CUSTOM PROCEDURE TRAY) ×2 IMPLANT
PACK LITHOTOMY IV (CUSTOM PROCEDURE TRAY) ×1 IMPLANT
PENCIL BUTTON HOLSTER BLD 10FT (ELECTRODE) ×2 IMPLANT
SHEARS HARMONIC 9CM CVD (BLADE) ×1 IMPLANT
SLEEVE SCD COMPRESS KNEE MED (MISCELLANEOUS) ×2 IMPLANT
SPONGE GAUZE 4X4 12PLY STER LF (GAUZE/BANDAGES/DRESSINGS) IMPLANT
SPONGE HEMORRHOID 8X3CM (HEMOSTASIS) IMPLANT
SPONGE SURGIFOAM ABS GEL 100 (HEMOSTASIS) ×1 IMPLANT
SURGILUBE 2OZ TUBE FLIPTOP (MISCELLANEOUS) ×2 IMPLANT
SUT MON AB 3-0 SH 27 (SUTURE) ×4
SUT MON AB 3-0 SH27 (SUTURE) ×2 IMPLANT
SYR CONTROL 10ML LL (SYRINGE) ×2 IMPLANT
TAPE CLOTH 3X10 TAN LF (GAUZE/BANDAGES/DRESSINGS) ×2 IMPLANT
TAPE CLOTH SURG 6X10 WHT LF (GAUZE/BANDAGES/DRESSINGS) IMPLANT
TOWEL OR 17X24 6PK STRL BLUE (TOWEL DISPOSABLE) ×2 IMPLANT
TOWEL OR NON WOVEN STRL DISP B (DISPOSABLE) ×2 IMPLANT
TRAY DSU PREP LF (CUSTOM PROCEDURE TRAY) ×2 IMPLANT
TUBE CONNECTING 20X1/4 (TUBING) ×2 IMPLANT
UNDERPAD 30X30 INCONTINENT (UNDERPADS AND DIAPERS) ×2 IMPLANT
YANKAUER SUCT BULB TIP NO VENT (SUCTIONS) ×2 IMPLANT

## 2014-07-05 NOTE — Op Note (Signed)
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  Anesthesia: Gen. With local  EBL: Less than 100 cc  Specimen: Hemorrhoid tissue  Drains: None  IV fluids:1000 cc  Indications for procedure: Patient presents with symptomatic internal and external hemorrhoid disease. They have failed medical management. Operative and nonoperative options discussed. Office procedures discussed. Risk of bleeding, infection, anal stenosis, prostate injury,  Urethra injury,  Organ injury ,  chronic pain, incontinence, recurrence, injury to sphincter, the need for further operations, abscess formation, and anesthesia risk discussed. Pt has suffered from anemia and had failed medical management.  Hematologist has tried to manage medically as well but this has failed.  He want to proceed with hemorrhoidectomy for bleeding and prolapse. Too large to band or inject.  PPH would leave significant external disease.   Description of procedure: The patient was met in the holding area and questions are answered. Patient was taken back to the operating room and placed upon the OR table. After induction of appropriate level of general anesthesia, the patient was placed in lithotomy lower extremities padded and note undue stress. Perineum was prepped and draped in a sterile fashion. Timeout was done. The patient received preoperative antibiotics. Digital examination revealed normal tone. Prostate normal.  Anoscopy with the anoscope was performed. The  Left lateral hemorrhoid pile was grasped and excised with the harmonic scalpel. Mucosa was oversewn with 3-0 Monocryl. Internal sphincter and external sphincter preserved.   Hemostasis achieved. Anal block was placed with Exparel diluted with 20 cc NS. Marland Kitchen  The right posterior column was excised as  well.  He had a smaller right anterior  Column but excising this would significantly narrow the anal canal.  This was left in place. Surgicel wrapped around Gelfoam placed as packing. Dry dressing applied. Patient was taken out of lithotomy. The patient was extubated taken recovery in satisfactory condition. All final counts are found to be correct.

## 2014-07-05 NOTE — Transfer of Care (Signed)
Immediate Anesthesia Transfer of Care Note  Patient: Paul [name]  Procedure(s) Performed: Procedure(s): HEMORRHOIDECTOMY (N/A)  Patient Location: PACU  Anesthesia Type:General  Level of Consciousness: awake, alert , oriented and patient cooperative  Airway & Oxygen Therapy: Patient Spontanous Breathing and Patient connected to face mask oxygen  Post-op Assessment: Report given to PACU RN and Post -op Vital signs reviewed and stable  Post vital signs: Reviewed and stable  Complications: No apparent anesthesia complications

## 2014-07-05 NOTE — Anesthesia Preprocedure Evaluation (Addendum)
Anesthesia Evaluation  Patient identified by MRN, date of birth, ID band Patient awake    Reviewed: Allergy & Precautions, H&P , NPO status , Patient's Chart, lab work & pertinent test results  Airway Mallampati: I  TM Distance: >3 FB Neck ROM: Full    Dental   Pulmonary former smoker,          Cardiovascular hypertension, Pt. on medications     Neuro/Psych    GI/Hepatic   Endo/Other    Renal/GU      Musculoskeletal   Abdominal   Peds  Hematology   Anesthesia Other Findings   Reproductive/Obstetrics                            Anesthesia Physical Anesthesia Plan  ASA: II  Anesthesia Plan: General   Post-op Pain Management:    Induction: Intravenous  Airway Management Planned: LMA  Additional Equipment:   Intra-op Plan:   Post-operative Plan: Extubation in OR  Informed Consent: I have reviewed the patients History and Physical, chart, labs and discussed the procedure including the risks, benefits and alternatives for the proposed anesthesia with the patient or authorized representative who has indicated his/her understanding and acceptance.     Plan Discussed with: CRNA and Surgeon  Anesthesia Plan Comments:         Anesthesia Quick Evaluation

## 2014-07-05 NOTE — Discharge Instructions (Signed)
[facility] _______[facility] ° °RECTAL SURGERY POST OP INSTRUCTIONS: POST OP INSTRUCTIONS ° °Always review your discharge instruction sheet given to you by the facility where your surgery was performed. °IF YOU HAVE DISABILITY OR FAMILY LEAVE FORMS, YOU MUST BRING THEM TO THE OFFICE FOR PROCESSING.   °DO NOT GIVE THEM TO YOUR DOCTOR. ° °1. A  prescription for pain medication may be given to you upon discharge.  Take your pain medication as prescribed, if needed.  If narcotic pain medicine is not needed, then you may take acetaminophen (Tylenol) or ibuprofen (Advil) as needed. °2. Take your usually prescribed medications unless otherwise directed. °3. If you need a refill on your pain medication, please contact your pharmacy.  They will contact our office to request authorization. Prescriptions will not be filled after 5 pm or on week-ends. °4. You should follow a light diet the first 48 hours after arrival home, such as soup and crackers, etc.  Be sure to include lots of fluids daily.  Resume your normal diet 2-3 days after surgery.. °5. Most patients will experience some swelling and discomfort in the rectal area. Ice packs, reclining and warm tub soaks will help.  Swelling and discomfort can take several days to resolve.  °6. It is common to experience some constipation if taking pain medication after surgery.  Increasing fluid intake and taking a stool softener (such as Colace) will usually help or prevent this problem from occurring.  A mild laxative (Milk of Magnesia or Miralax) should be taken according to package directions if there are no bowel movements after 48 hours. °7. Unless discharge instructions indicate otherwise, leave your bandage dry and in place for 24 hours, or remove the bandage if you have a bowel movement. You may notice a small amount of bleeding with bowel movements for the first few days. You may have some packing in the rectum which will come out over the first day or two. You  will need to wear an absorbent pad or soft cotton gauze in your underwear until the drainage stops.it. °8. ACTIVITIES:  You may resume regular (light) daily activities beginning the next day--such as daily self-care, walking, climbing stairs--gradually increasing activities as tolerated.  You may have sexual intercourse when it is comfortable.  Refrain from any heavy lifting or straining until approved by your doctor. °a. You may drive when you are no longer taking prescription pain medication, you can comfortably wear a seatbelt, and you can safely maneuver your car and apply brakes. °b. RETURN TO WORK: : ____________________ °c.  °9. You should see your doctor in the office for a follow-up appointment approximately 2-3 weeks after your surgery.  Make sure that you call for this appointment within a day or two after you arrive home to insure a convenient appointment time. °10. OTHER INSTRUCTIONS:  __________________________________________________________________________________________________________________________________________________________________________________________  °WHEN TO CALL YOUR DOCTOR: °1. Fever over 101.0 °2. Inability to urinate °3. Nausea and/or vomiting °4. Extreme swelling or bruising °5. Continued bleeding from rectum. °6. Increased pain, redness, or drainage from the incision °7. Constipation ° °The clinic staff is available to answer your questions during regular business hours.  Please don’t hesitate to call and ask to speak to one of the nurses for clinical concerns.  If you have a medical emergency, go to the nearest emergency room or call 911.  A surgeon from [facility] is always on call at the hospital ° ° °[address], [address], [place], [place]  [address] ? °   [id number], [place], [place]   [address] [phone] ? [phone] ? FAX [phone] Web site: [email or web address]  Post Anesthesia Home Care Instructions  Activity: Get  plenty of rest for the remainder of the day. A responsible adult should stay with you for 24 hours following the procedure.  For the next 24 hours, DO NOT: -Drive a car -Paediatric nurse -Drink alcoholic beverages -Take any medication unless instructed by your physician -Make any legal decisions or sign important papers.  Meals: Start with liquid foods such as gelatin or soup. Progress to regular foods as tolerated. Avoid greasy, spicy, heavy foods. If nausea and/or vomiting occur, drink only clear liquids until the nausea and/or vomiting subsides. Call your physician if vomiting continues.  Special Instructions/Symptoms: Your throat may feel dry or sore from the anesthesia or the breathing tube placed in your throat during surgery. If this causes discomfort, gargle with warm salt water. The discomfort should disappear within 24 hours.    Post Anesthesia Home Care Instructions  Activity: Get plenty of rest for the remainder of the day. A responsible adult should stay with you for 24 hours following the procedure.  For the next 24 hours, DO NOT: -Drive a car -Paediatric nurse -Drink alcoholic beverages -Take any medication unless instructed by your physician -Make any legal decisions or sign important papers.  Meals: Start with liquid foods such as gelatin or soup. Progress to regular foods as tolerated. Avoid greasy, spicy, heavy foods. If nausea and/or vomiting occur, drink only clear liquids until the nausea and/or vomiting subsides. Call your physician if vomiting continues.  Special Instructions/Symptoms: Your throat may feel dry or sore from the anesthesia or the breathing tube placed in your throat during surgery. If this causes discomfort, gargle with warm salt water. The discomfort should disappear within 24 hours.   Information for Discharge Teaching: EXPAREL (bupivacaine liposome injectable suspension)   Your surgeon gave you EXPAREL(bupivacaine) in your surgical  incision to help control your pain after surgery.   EXPAREL is a local anesthetic that provides pain relief by numbing the tissue around the surgical site.  EXPAREL is designed to release pain medication over time and can control pain for up to 72 hours.  Depending on how you respond to EXPAREL, you may require less pain medication during your recovery.  Possible side effects:  Temporary loss of sensation or ability to move in the area where bupivacaine was injected.  Nausea, vomiting, constipation  Rarely, numbness and tingling in your mouth or lips, lightheadedness, or anxiety may occur.  Call your doctor right away if you think you may be experiencing any of these sensations, or if you have other questions regarding possible side effects.  Follow all other discharge instructions given to you by your surgeon or nurse. Eat a healthy diet and drink plenty of water or other fluids.  If you return to the hospital for any reason within 96 hours following the administration of EXPAREL, please inform your health care providers.   Call your surgeon if you experience:   1.  Fever over 101.0. 2.  Inability to urinate. 3.  Nausea and/or vomiting. 4.  Extreme swelling or bruising at the surgical site. 5.  Continued bleeding from the incision. 6.  Increased pain, redness or drainage from the incision. 7.  Problems related to your pain medication. 8. Any change in color, movement and/or sensation 9. Any problems and/or concerns

## 2014-07-05 NOTE — Anesthesia Procedure Notes (Signed)
Procedure Name: LMA Insertion Performed by: Sutton Plake Pre-anesthesia Checklist: Patient identified, Emergency Drugs available, Suction available and Patient being monitored Patient Re-evaluated:Patient Re-evaluated prior to inductionOxygen Delivery Method: Circle System Utilized Preoxygenation: Pre-oxygenation with 100% oxygen Intubation Type: IV induction Ventilation: Mask ventilation without difficulty LMA: LMA inserted LMA Size: 5.0 Number of attempts: 1 Airway Equipment and Method: bite block Placement Confirmation: positive ETCO2 Tube secured with: Tape Dental Injury: Teeth and Oropharynx as per pre-operative assessment

## 2014-07-05 NOTE — H&P (View-Only) (Signed)
Paul [name]. [name] [name] 9:32 AM Location: [facility] Patient #: [id number] DOB: June 28, 1941 Married / Language: Paul [name] / Race: White Male History of Present Illness Paul [name] A. Paul [name] MD; 11[name]9[name]2015 9:55 AM) Patient words: hems    Patient sat at the request Paul Lark, MD for hemorrhoid disease. Patient is being treated for anemia. He has been worked up with upper lower endoscopy and has required higher transfusions. He has intermittent rectal bleeding. This does not appear cavity. It is not with every bowel movement. He does notice blood when he wipes but not after each bowel movement. He has intermittent bouts of blood in the commode. He is unclear how often this happens it is not with every bowel movement.he states he spent 1 hour having a bowel movement. He likes to read on the commode.  Pt continues to require iron and has intermittent bleeding. Earlier GI work up negative. Pt returns to consider hemorrhoidectomy.  The patient is a 73 year old male   Other Problems Paul [name], Paul [name]; [name] 9:33 AM) Arthritis Back Pain Gastroesophageal Reflux Disease Hemorrhoids High blood pressure Hypercholesterolemia Prostate Cancer Transfusion history  Past Surgical History Paul [name], Paul [name]; [name] 9:33 AM) Prostate Surgery - Removal  Diagnostic Studies History Paul [name], Paul [name]; [name] 9:33 AM) Colonoscopy within last year  Allergies Paul [name], Paul [name]; [name] 9:34 AM) Sulfa Antibiotics  Medication History (Paul [name], Paul [name]; [name] 9:36 AM) Aspirin (81MG  Tablet, Oral) Active. AmLODIPine Besylate (5MG  Tablet, Oral) Active. Atorvastatin Calcium (40MG  Tablet, Oral) Active. Lisinopril (20MG  Tablet, Oral) Active. Folic Acid (448JEH Tablet, Oral) Active. Pantoprazole Sodium (40MG  Tablet DR, Oral) Active. Vitamin B-12 (1000MCG Tablet, Oral) Active.     Review of Systems Paul [name] Paul [name]; [name] 9:33 AM) General Not Present-  Appetite Loss, Chills, Fatigue, Fever, Night Sweats, Weight Gain and Weight Loss. Skin Not Present- Change in WartGalvanMole, Dryness, Hives, Jaundice, New Lesions, Non-Healing Wounds, Rash and Ulcer. HEENT Not Present- Earache, Hearing Loss, Hoarseness, Nose Bleed, Oral Ulcers, Ringing in the Ears, Seasonal Allergies, Sinus Pain, Sore Throat, Visual Disturbances, Wears glassesGalvancontact lenses and Yellow Eyes. Respiratory Present- Snoring. Not Present- Bloody sputum, Chronic Cough, Difficulty Breathing and Wheezing. Cardiovascular Not Present- Chest Pain, Difficulty Breathing Lying Down, Leg Cramps, Palpitations, Rapid Heart Rate, Shortness of Breath and Swelling of Extremities. Gastrointestinal Present- Bloody Stool. Not Present- Abdominal Pain, Bloating, Change in Bowel Habits, Chronic diarrhea, Constipation, Difficulty Swallowing, Excessive gas, Gets full quickly at meals, Hemorrhoids, Indigestion, Nausea, Rectal Pain and Vomiting. Male Genitourinary Present- Urine Leakage. Not Present- Blood in Urine, Change in Urinary Stream, Frequency, Impotence, Nocturia, Painful Urination and Urgency. Musculoskeletal Present- Back Pain. Not Present- Joint Pain, Joint Stiffness, Muscle Pain, Muscle Weakness and Swelling of Extremities. Neurological Present- Weakness. Not Present- Decreased Memory, Fainting, Headaches, Numbness, Seizures, Tingling, Tremor and Trouble walking. Psychiatric Not Present- Anxiety, Bipolar, Change in Sleep Pattern, Depression, Fearful and Frequent crying. Endocrine Not Present- Cold Intolerance, Excessive Hunger, Hair Changes, Heat Intolerance, Hot flashes and New Diabetes. Hematology Not Present- Easy Bruising, Excessive bleeding, Gland problems, HIV and Persistent Infections.  Vitals (Paul [name] Paul [name]; [name] 9:37 AM) [name] 9:36 AM Weight: 202 lb Height: 68in Body Surface Area: 2.1 m Body Mass Index: 30.71 kgGalvanm Temp.: 56F(Temporal)  Pulse: 74 (Regular)  BP: 126[name]70  (Sitting, Left Arm, Standard)     Physical Exam (Paul [name] A. Paul Bratton MD; [name] 9:56 AM)  General Mental Status-Alert. General Appearance-Consistent with stated age. Hydration-Well hydrated. Voice-Normal.  Eye Eyeball - Bilateral-Extraocular movements intact. ScleraGalvanConjunctiva - Bilateral-No scleral icterus.  Chest  and Lung Exam Chest and lung exam reveals -quiet, even and easy respiratory effort with no use of accessory muscles and on auscultation, normal breath sounds, no adventitious sounds and normal vocal resonance. Inspection Chest Wall - Normal. Back - normal.  Abdomen Inspection Inspection of the abdomen reveals - No Hernias. Skin - Scar - no surgical scars. PalpationGalvanPercussion Palpation and Percussion of the abdomen reveal - Soft, Non Tender, No Rebound tenderness, No Rigidity (guarding) and No hepatosplenomegaly. Auscultation Auscultation of the abdomen reveals - Bowel sounds normal.  Rectal Note: no reexamined today   Musculoskeletal Normal Exam - Left-Upper Extremity Strength Normal and Lower Extremity Strength Normal. Normal Exam - Right-Upper Extremity Strength Normal and Lower Extremity Strength Normal.  Lymphatic Head & Neck  General Head & Neck Lymphatics: Bilateral - Description - Normal. Axillary  General Axillary Region: Bilateral - Description - Normal. Tenderness - Non Tender. Femoral & Inguinal  Generalized Femoral & Inguinal Lymphatics: Bilateral - Description - Normal. Tenderness - Non Tender.    Assessment & Plan (Paul [name] A. Paul Geiselman MD; [name] 9:57 AM)  HEMORRHOIDS, INTERNAL (455.0  K64.8) Impression: discussed that hemorrhoids unlikely source of anemia and that hemorrhoidectomy may or may not help. Not suitable for office based procedures. Discussed complications of bleeding infection incontinence organ injury anal stenosis and other possible injuries. He agrees to proceed because no other bleeding source  identified.  Current Plans Pt Education - [facility] Rectal Surgery HCI [facility] Consent - BASIC (Gross): discussed with patient and provided information. Pt Education - [facility] Hemorrhoids (AT)

## 2014-07-05 NOTE — Interval H&P Note (Signed)
History and Physical Interval Note:  07/05/2014 1:49 PM  Paul [name]  has presented today for surgery, with the diagnosis of Hemorrhoids  The various methods of treatment have been discussed with the patient and family. After consideration of risks, benefits and other options for treatment, the patient has consented to  Procedure(s): HEMORRHOIDECTOMY (N/A) as a surgical intervention .  The patient's history has been reviewed, patient examined, no change in status, stable for surgery.  I have reviewed the patient's chart and labs.  Questions were answered to the patient's satisfaction.     Valisha Heslin A.

## 2014-07-05 NOTE — Anesthesia Postprocedure Evaluation (Signed)
Anesthesia Post Note  Patient: Paul [name]  Procedure(s) Performed: Procedure(s) (LRB): HEMORRHOIDECTOMY (N/A)  Anesthesia type: general  Patient location: PACU  Post pain: Pain level controlled  Post assessment: Patient's Cardiovascular Status Stable  Last Vitals:  Filed Vitals:   07/05/14 1530  BP: 136/72  Pulse: 81  Temp:   Resp: 20    Post vital signs: Reviewed and stable  Level of consciousness: sedated  Complications: No apparent anesthesia complications

## 2014-07-06 ENCOUNTER — Encounter (HOSPITAL_BASED_OUTPATIENT_CLINIC_OR_DEPARTMENT_OTHER): Payer: Self-pay | Admitting: Surgery

## 2014-07-07 ENCOUNTER — Other Ambulatory Visit: Payer: Medicare Other

## 2014-07-11 ENCOUNTER — Telehealth: Payer: Self-pay | Admitting: *Deleted

## 2014-07-11 NOTE — Telephone Encounter (Signed)
Wife states pt missed Lab last week due to recent surgery.  She asks if he needs to have labs again this month?  He is not scheduled for lab again until January 7th?

## 2014-07-12 ENCOUNTER — Telehealth: Payer: Self-pay | Admitting: *Deleted

## 2014-07-12 NOTE — Telephone Encounter (Signed)
He can wait until January for repeat labs, to call back if he start feeling weak again.

## 2014-07-12 NOTE — Telephone Encounter (Signed)
Notified wife that it is OK for patient to wait until January for repeat labs, to call us back if he starts feeling weak again.

## 2014-08-11 ENCOUNTER — Other Ambulatory Visit (HOSPITAL_BASED_OUTPATIENT_CLINIC_OR_DEPARTMENT_OTHER): Payer: BLUE CROSS/BLUE SHIELD

## 2014-08-11 ENCOUNTER — Telehealth: Payer: Self-pay | Admitting: *Deleted

## 2014-08-11 DIAGNOSIS — D509 Iron deficiency anemia, unspecified: Secondary | ICD-10-CM

## 2014-08-11 LAB — CBC & DIFF AND RETIC
BASO%: 0.3 % (ref 0.0–2.0)
BASOS ABS: 0 10*3/uL (ref 0.0–0.1)
EOS ABS: 0.2 10*3/uL (ref 0.0–0.5)
EOS%: 2.2 % (ref 0.0–7.0)
HCT: 43.1 % (ref 38.4–49.9)
HGB: 14.3 g/dL (ref 13.0–17.1)
IMMATURE RETIC FRACT: 5.2 % (ref 3.00–10.60)
LYMPH#: 3.3 10*3/uL (ref 0.9–3.3)
LYMPH%: 45.4 % (ref 14.0–49.0)
MCH: 32.3 pg (ref 27.2–33.4)
MCHC: 33.2 g/dL (ref 32.0–36.0)
MCV: 97.3 fL (ref 79.3–98.0)
MONO#: 0.7 10*3/uL (ref 0.1–0.9)
MONO%: 9.6 % (ref 0.0–14.0)
NEUT#: 3.1 10*3/uL (ref 1.5–6.5)
NEUT%: 42.5 % (ref 39.0–75.0)
PLATELETS: 279 10*3/uL (ref 140–400)
RBC: 4.43 10*6/uL (ref 4.20–5.82)
RDW: 14 % (ref 11.0–14.6)
RETIC CT ABS: 31.01 10*3/uL — AB (ref 34.80–93.90)
Retic %: 0.7 % — ABNORMAL LOW (ref 0.80–1.80)
WBC: 7.2 10*3/uL (ref 4.0–10.3)

## 2014-08-11 LAB — FERRITIN CHCC: Ferritin: 80 ng/ml (ref 22–316)

## 2014-08-11 NOTE — Telephone Encounter (Signed)
Informed wife of ferritin and cbc wnl today.  She verbalized understanding.  States pt had hemorrhoid surgery 07/05/14 and he is doing well.  This may have helped his blood counts.

## 2014-08-11 NOTE — Telephone Encounter (Signed)
-----   Message from Mauss Lark, MD sent at 08/11/2014 11:35 AM EST ----- Regarding: blood tyes Please let him know it is good ----- Message -----    From: Lab in [facility] Interface    Sent: 08/11/2014  10:16 AM      To: Bean Lark, MD

## 2014-08-23 ENCOUNTER — Encounter: Payer: Self-pay | Admitting: *Deleted

## 2014-09-08 ENCOUNTER — Other Ambulatory Visit (HOSPITAL_BASED_OUTPATIENT_CLINIC_OR_DEPARTMENT_OTHER): Payer: BLUE CROSS/BLUE SHIELD

## 2014-09-08 ENCOUNTER — Telehealth: Payer: Self-pay | Admitting: *Deleted

## 2014-09-08 DIAGNOSIS — D509 Iron deficiency anemia, unspecified: Secondary | ICD-10-CM

## 2014-09-08 LAB — CBC & DIFF AND RETIC
BASO%: 0.3 % (ref 0.0–2.0)
Basophils Absolute: 0 10*3/uL (ref 0.0–0.1)
EOS ABS: 0.3 10*3/uL (ref 0.0–0.5)
EOS%: 3.6 % (ref 0.0–7.0)
HCT: 44.8 % (ref 38.4–49.9)
HEMOGLOBIN: 14.9 g/dL (ref 13.0–17.1)
IMMATURE RETIC FRACT: 3.8 % (ref 3.00–10.60)
LYMPH#: 3 10*3/uL (ref 0.9–3.3)
LYMPH%: 43.3 % (ref 14.0–49.0)
MCH: 32.3 pg (ref 27.2–33.4)
MCHC: 33.3 g/dL (ref 32.0–36.0)
MCV: 97 fL (ref 79.3–98.0)
MONO#: 0.8 10*3/uL (ref 0.1–0.9)
MONO%: 11.8 % (ref 0.0–14.0)
NEUT%: 41 % (ref 39.0–75.0)
NEUTROS ABS: 2.9 10*3/uL (ref 1.5–6.5)
Platelets: 276 10*3/uL (ref 140–400)
RBC: 4.62 10*6/uL (ref 4.20–5.82)
RDW: 14.1 % (ref 11.0–14.6)
Retic %: 0.83 % (ref 0.80–1.80)
Retic Ct Abs: 38.35 10*3/uL (ref 34.80–93.90)
WBC: 7 10*3/uL (ref 4.0–10.3)

## 2014-09-08 LAB — FERRITIN CHCC: FERRITIN: 70 ng/mL (ref 22–316)

## 2014-09-08 NOTE — Telephone Encounter (Signed)
Informed pt's wife of pt's ferritin level is stable and no need for IV iron. She was already aware of his hgb level wnl.  She verbalized understanding.

## 2014-09-08 NOTE — Telephone Encounter (Signed)
-----   Message from Mccullars Lark, MD sent at 09/08/2014 11:55 AM EST ----- Regarding: stable labs No need to worry about iv iron ----- Message -----    From: Lab in [facility] Interface    Sent: 09/08/2014  10:33 AM      To: Yazdani Lark, MD

## 2014-10-06 ENCOUNTER — Other Ambulatory Visit (HOSPITAL_BASED_OUTPATIENT_CLINIC_OR_DEPARTMENT_OTHER): Payer: BLUE CROSS/BLUE SHIELD

## 2014-10-06 ENCOUNTER — Telehealth: Payer: Self-pay | Admitting: *Deleted

## 2014-10-06 DIAGNOSIS — D509 Iron deficiency anemia, unspecified: Secondary | ICD-10-CM

## 2014-10-06 LAB — CBC & DIFF AND RETIC
BASO%: 0.5 % (ref 0.0–2.0)
Basophils Absolute: 0 10*3/uL (ref 0.0–0.1)
EOS%: 2.7 % (ref 0.0–7.0)
Eosinophils Absolute: 0.2 10*3/uL (ref 0.0–0.5)
HCT: 42.4 % (ref 38.4–49.9)
HEMOGLOBIN: 14.3 g/dL (ref 13.0–17.1)
Immature Retic Fract: 5.6 % (ref 3.00–10.60)
LYMPH%: 45.1 % (ref 14.0–49.0)
MCH: 31.9 pg (ref 27.2–33.4)
MCHC: 33.7 g/dL (ref 32.0–36.0)
MCV: 94.6 fL (ref 79.3–98.0)
MONO#: 1 10*3/uL — ABNORMAL HIGH (ref 0.1–0.9)
MONO%: 11.6 % (ref 0.0–14.0)
NEUT%: 40.1 % (ref 39.0–75.0)
NEUTROS ABS: 3.3 10*3/uL (ref 1.5–6.5)
PLATELETS: 275 10*3/uL (ref 140–400)
RBC: 4.48 10*6/uL (ref 4.20–5.82)
RDW: 14 % (ref 11.0–14.6)
RETIC CT ABS: 47.94 10*3/uL (ref 34.80–93.90)
Retic %: 1.07 % (ref 0.80–1.80)
WBC: 8.3 10*3/uL (ref 4.0–10.3)
lymph#: 3.7 10*3/uL — ABNORMAL HIGH (ref 0.9–3.3)

## 2014-10-06 LAB — FERRITIN CHCC: Ferritin: 59 ng/ml (ref 22–316)

## 2014-10-06 NOTE — Telephone Encounter (Signed)
-----   Message from Knoth Lark, MD sent at 10/06/2014 11:40 AM EST ----- Regarding: low iron level He is not anemic but ferritin is now 59. I recommend either iv iron tomorrow or next month when he comes in for labs. Can you ask him to see what he wants? ----- Message -----    From: Lab in [facility] Interface    Sent: 10/06/2014  10:08 AM      To: Descoteaux Lark, MD

## 2014-10-06 NOTE — Telephone Encounter (Signed)
VM left for pt to call nurse back regarding Dr. Calton Dach message below.

## 2014-10-07 ENCOUNTER — Ambulatory Visit (HOSPITAL_BASED_OUTPATIENT_CLINIC_OR_DEPARTMENT_OTHER): Payer: BLUE CROSS/BLUE SHIELD

## 2014-10-07 ENCOUNTER — Telehealth: Payer: Self-pay | Admitting: *Deleted

## 2014-10-07 ENCOUNTER — Other Ambulatory Visit: Payer: Self-pay | Admitting: Hematology and Oncology

## 2014-10-07 DIAGNOSIS — K922 Gastrointestinal hemorrhage, unspecified: Secondary | ICD-10-CM

## 2014-10-07 DIAGNOSIS — D500 Iron deficiency anemia secondary to blood loss (chronic): Secondary | ICD-10-CM

## 2014-10-07 DIAGNOSIS — D509 Iron deficiency anemia, unspecified: Secondary | ICD-10-CM

## 2014-10-07 MED ORDER — SODIUM CHLORIDE 0.9 % IV SOLN
510.0000 mg | Freq: Once | INTRAVENOUS | Status: AC
  Administered 2014-10-07: 510 mg via INTRAVENOUS
  Filled 2014-10-07: qty 17

## 2014-10-07 MED ORDER — SODIUM CHLORIDE 0.9 % IV SOLN
Freq: Once | INTRAVENOUS | Status: AC
  Administered 2014-10-07: 14:00:00 via INTRAVENOUS

## 2014-10-07 NOTE — Telephone Encounter (Signed)
Per staff message I have scheduled appts. JMW  

## 2014-10-07 NOTE — Patient Instructions (Signed)

## 2014-10-07 NOTE — Telephone Encounter (Signed)
PT. WOULD LIKE TO RECEIVE IV IRON TODAY. NOTIFIED DR.GORSUCH'S NURSE, [facility]. SHE WILL NOTIFY [place] TO PUT IN IV IRON ORDERS. INFORMED PT. TO BE AT [place] AT 1:45 PM FOR IV IRON INFUSION.

## 2014-10-14 ENCOUNTER — Ambulatory Visit (HOSPITAL_BASED_OUTPATIENT_CLINIC_OR_DEPARTMENT_OTHER): Payer: BLUE CROSS/BLUE SHIELD

## 2014-10-14 DIAGNOSIS — D509 Iron deficiency anemia, unspecified: Secondary | ICD-10-CM

## 2014-10-14 MED ORDER — SODIUM CHLORIDE 0.9 % IV SOLN
510.0000 mg | Freq: Once | INTRAVENOUS | Status: AC
  Administered 2014-10-14: 510 mg via INTRAVENOUS
  Filled 2014-10-14: qty 17

## 2014-10-14 MED ORDER — SODIUM CHLORIDE 0.9 % IV SOLN
Freq: Once | INTRAVENOUS | Status: AC
  Administered 2014-10-14: 15:00:00 via INTRAVENOUS

## 2014-10-14 NOTE — Patient Instructions (Signed)

## 2014-11-08 ENCOUNTER — Ambulatory Visit (INDEPENDENT_AMBULATORY_CARE_PROVIDER_SITE_OTHER): Payer: Medicare Other | Admitting: Family Medicine

## 2014-11-08 ENCOUNTER — Encounter: Payer: Self-pay | Admitting: Family Medicine

## 2014-11-08 ENCOUNTER — Encounter (INDEPENDENT_AMBULATORY_CARE_PROVIDER_SITE_OTHER): Payer: Self-pay

## 2014-11-08 VITALS — BP 126/78 | HR 82 | Temp 97.0°F | Ht 67.0 in | Wt 202.0 lb

## 2014-11-08 DIAGNOSIS — I10 Essential (primary) hypertension: Secondary | ICD-10-CM | POA: Diagnosis not present

## 2014-11-08 DIAGNOSIS — E785 Hyperlipidemia, unspecified: Secondary | ICD-10-CM | POA: Diagnosis not present

## 2014-11-08 NOTE — Progress Notes (Signed)
Subjective:    Patient ID: Paul [name], male    DOB: 1941-07-29, 74 y.o.   MRN: [id number]  HPI 74 year old gentleman here to follow-up hypertension and hyperlipidemia area he also has adenocarcinoma of the prostate and is followed by urology. Since his last visit here he had hemorrhoid surgery in [place] and that went well by history. He does have some arthralgias particularly in his knees. He denies any side effects with statin.  Patient Active Problem List   Diagnosis Date Noted  . Hemorrhoids, internal 12/24/2013  . Need for Tdap vaccination 11/23/2013  . Adenocarcinoma of prostate   . Kidney stones   . Iron deficiency anemia 10/16/2012  . Hyperlipidemia 04/05/2010  . Overweight 04/05/2010  . Essential hypertension, benign 04/05/2010  . FATIGUE / MALAISE 04/05/2010  . ABNORMAL ELECTROCARDIOGRAM 04/05/2010   Outpatient Encounter Prescriptions as of 11/08/2014  Medication Sig  . amLODipine (NORVASC) 5 MG tablet Take 1 tablet (5 mg total) by mouth daily.  Marland Kitchen aspirin 81 MG tablet Take 81 mg by mouth daily.  Marland Kitchen atorvastatin (LIPITOR) 40 MG tablet Take 1 tablet (40 mg total) by mouth daily.  . Cholecalciferol (VITAMIN D3) 2000 UNITS TABS Take 2,000 Units by mouth daily.  . folic acid (FOLVITE) 702 MCG tablet Take 400 mcg by mouth daily.  Marland Kitchen lidocaine (XYLOCAINE JELLY) 2 % jelly Apply 1 application topically as needed. Apply to anus BID as needed for pain  . lisinopril (PRINIVIL,ZESTRIL) 20 MG tablet Take 1 tablet (20 mg total) by mouth daily.  . vitamin B-12 (CYANOCOBALAMIN) 1000 MCG tablet Take 1,000 mcg by mouth daily.  . [DISCONTINUED] docusate sodium (COLACE) 100 MG capsule Take 100 mg by mouth 2 (two) times daily.  . [DISCONTINUED] pantoprazole (PROTONIX) 40 MG tablet Take 1 tablet (40 mg total) by mouth daily.  . [DISCONTINUED] oxyCODONE-acetaminophen (PERCOCET) 10-325 MG per tablet Take 1 tablet by mouth every 4 (four) hours as needed for pain.  . [DISCONTINUED] polyethylene  glycol powder (GLYCOLAX) powder Take 127.5 g by mouth daily.     Review of Systems  Constitutional: Negative.   HENT: Negative.   Eyes: Negative.   Respiratory: Negative.  Negative for shortness of breath.   Cardiovascular: Negative.  Negative for chest pain and leg swelling.  Gastrointestinal: Negative.   Genitourinary: Negative.   Musculoskeletal: Negative.   Skin: Negative.   Neurological: Negative.   Psychiatric/Behavioral: Negative.   All other systems reviewed and are negative.      Objective:   Physical Exam  Constitutional: He is oriented to person, place, and time. He appears well-developed and well-nourished.  HENT:  Head: Normocephalic.  Right Ear: External ear normal.  Left Ear: External ear normal.  Nose: Nose normal.  Mouth/Throat: Oropharynx is clear and moist.  Eyes: Conjunctivae and EOM are normal. Pupils are equal, round, and reactive to light.  Neck: Normal range of motion. Neck supple.  Cardiovascular: Normal rate, regular rhythm, normal heart sounds and intact distal pulses.   Pulmonary/Chest: Effort normal and breath sounds normal.  Abdominal: Soft. Bowel sounds are normal.  Musculoskeletal: Normal range of motion.  Neurological: He is alert and oriented to person, place, and time.  Skin: Skin is warm and dry.  Psychiatric: He has a normal mood and affect. His behavior is normal. Judgment and thought content normal.    BP 126/78 mmHg  Pulse 82  Temp(Src) 97 F (36.1 C) (Oral)  Ht 5' 7"  (1.702 m)  Wt 202 lb (91.627 kg)  BMI 31.63  kg/m2       Assessment & Plan:  1. Hyperlipidemia Annual check of lipids and liver functions today. Likely continue with same dose - Lipid panel  2. Essential hypertension, benign Blood pressure is well controlled continue same medicine regimen - CMP14+EGFR   Wardell Honour MD

## 2014-11-09 ENCOUNTER — Other Ambulatory Visit: Payer: Self-pay | Admitting: Hematology and Oncology

## 2014-11-09 ENCOUNTER — Telehealth: Payer: Self-pay | Admitting: *Deleted

## 2014-11-09 DIAGNOSIS — D509 Iron deficiency anemia, unspecified: Secondary | ICD-10-CM

## 2014-11-09 LAB — CMP14+EGFR
ALBUMIN: 4.1 g/dL (ref 3.5–4.8)
ALT: 19 IU/L (ref 0–44)
AST: 18 IU/L (ref 0–40)
Albumin/Globulin Ratio: 1.6 (ref 1.1–2.5)
Alkaline Phosphatase: 115 IU/L (ref 39–117)
BUN/Creatinine Ratio: 15 (ref 10–22)
BUN: 15 mg/dL (ref 8–27)
Bilirubin Total: 0.8 mg/dL (ref 0.0–1.2)
CALCIUM: 9.6 mg/dL (ref 8.6–10.2)
CO2: 25 mmol/L (ref 18–29)
CREATININE: 1 mg/dL (ref 0.76–1.27)
Chloride: 100 mmol/L (ref 97–108)
GFR calc Af Amer: 86 mL/min/{1.73_m2} (ref >59)
GFR, EST NON AFRICAN AMERICAN: 74 mL/min/{1.73_m2} (ref >59)
GLOBULIN, TOTAL: 2.6 g/dL (ref 1.5–4.5)
GLUCOSE: 120 mg/dL — AB (ref 65–99)
Potassium: 4.4 mmol/L (ref 3.5–5.2)
Sodium: 140 mmol/L (ref 134–144)
TOTAL PROTEIN: 6.7 g/dL (ref 6.0–8.5)

## 2014-11-09 LAB — LIPID PANEL
CHOLESTEROL TOTAL: 169 mg/dL (ref 100–199)
Chol/HDL Ratio: 3 ratio units (ref 0.0–5.0)
HDL: 56 mg/dL (ref >39)
LDL CALC: 84 mg/dL (ref 0–99)
Triglycerides: 144 mg/dL (ref 0–149)
VLDL CHOLESTEROL CAL: 29 mg/dL (ref 5–40)

## 2014-11-09 NOTE — Telephone Encounter (Signed)
Pt aware of lab results 

## 2014-11-09 NOTE — Telephone Encounter (Signed)
lmtcb regarding test results. 

## 2014-11-09 NOTE — Telephone Encounter (Signed)
-----   Message from Wardell Honour, MD sent at 11/09/2014 12:39 PM EDT ----- Lipids are up slightly from one year ago but still at target ranges. Chemistries are unchanged. There is still some elevation of blood sugar consistent with mild diabetes and recommended treatment is watching carbohydrates and weight loss

## 2014-11-10 ENCOUNTER — Other Ambulatory Visit (HOSPITAL_BASED_OUTPATIENT_CLINIC_OR_DEPARTMENT_OTHER): Payer: BLUE CROSS/BLUE SHIELD

## 2014-11-10 ENCOUNTER — Ambulatory Visit: Payer: Medicare Other | Admitting: Family Medicine

## 2014-11-10 ENCOUNTER — Telehealth: Payer: Self-pay | Admitting: *Deleted

## 2014-11-10 DIAGNOSIS — D509 Iron deficiency anemia, unspecified: Secondary | ICD-10-CM | POA: Diagnosis not present

## 2014-11-10 LAB — CBC WITH DIFFERENTIAL/PLATELET
BASO%: 0.5 % (ref 0.0–2.0)
Basophils Absolute: 0 10*3/uL (ref 0.0–0.1)
EOS%: 2.7 % (ref 0.0–7.0)
Eosinophils Absolute: 0.2 10*3/uL (ref 0.0–0.5)
HEMATOCRIT: 42.7 % (ref 38.4–49.9)
HEMOGLOBIN: 14 g/dL (ref 13.0–17.1)
LYMPH%: 37.9 % (ref 14.0–49.0)
MCH: 31.6 pg (ref 27.2–33.4)
MCHC: 32.8 g/dL (ref 32.0–36.0)
MCV: 96.4 fL (ref 79.3–98.0)
MONO#: 0.9 10*3/uL (ref 0.1–0.9)
MONO%: 12.1 % (ref 0.0–14.0)
NEUT#: 3.6 10*3/uL (ref 1.5–6.5)
NEUT%: 46.8 % (ref 39.0–75.0)
Platelets: 332 10*3/uL (ref 140–400)
RBC: 4.43 10*6/uL (ref 4.20–5.82)
RDW: 14.6 % (ref 11.0–14.6)
WBC: 7.8 10*3/uL (ref 4.0–10.3)
lymph#: 3 10*3/uL (ref 0.9–3.3)

## 2014-11-10 LAB — HOLD TUBE, BLOOD BANK

## 2014-11-10 LAB — FERRITIN CHCC: FERRITIN: 518 ng/mL — AB (ref 22–316)

## 2014-11-10 NOTE — Telephone Encounter (Signed)
-----   Message from Jindra Lark, MD sent at 11/10/2014 11:29 AM EDT ----- Regarding: good ferritin level It's over 500, so he should be OK till next blood draw ----- Message -----    From: Lab in [facility] Interface    Sent: 11/10/2014  10:26 AM      To: Been Lark, MD

## 2014-11-10 NOTE — Telephone Encounter (Signed)
Informed pt's wife of lab results today are good.  Pt to keep next appt in May as scheduled. She verbalized understanding.

## 2014-11-28 ENCOUNTER — Other Ambulatory Visit: Payer: Self-pay | Admitting: *Deleted

## 2014-11-28 DIAGNOSIS — E785 Hyperlipidemia, unspecified: Secondary | ICD-10-CM

## 2014-11-28 DIAGNOSIS — I10 Essential (primary) hypertension: Secondary | ICD-10-CM

## 2014-11-28 MED ORDER — LISINOPRIL 20 MG PO TABS: 20.0000 mg | ORAL_TABLET | Freq: Every day | ORAL | Status: DC

## 2014-11-28 MED ORDER — ATORVASTATIN CALCIUM 40 MG PO TABS: 40.0000 mg | ORAL_TABLET | Freq: Every day | ORAL | Status: DC

## 2014-12-07 ENCOUNTER — Other Ambulatory Visit: Payer: Self-pay | Admitting: Hematology and Oncology

## 2014-12-07 DIAGNOSIS — D509 Iron deficiency anemia, unspecified: Secondary | ICD-10-CM

## 2014-12-08 ENCOUNTER — Other Ambulatory Visit (HOSPITAL_BASED_OUTPATIENT_CLINIC_OR_DEPARTMENT_OTHER): Payer: Medicare Other

## 2014-12-08 ENCOUNTER — Encounter: Payer: Self-pay | Admitting: Hematology and Oncology

## 2014-12-08 ENCOUNTER — Telehealth: Payer: Self-pay | Admitting: Hematology and Oncology

## 2014-12-08 ENCOUNTER — Ambulatory Visit (HOSPITAL_BASED_OUTPATIENT_CLINIC_OR_DEPARTMENT_OTHER): Payer: Medicare Other | Admitting: Hematology and Oncology

## 2014-12-08 VITALS — BP 150/71 | HR 80 | Temp 97.7°F | Resp 18 | Ht 67.0 in | Wt 206.2 lb

## 2014-12-08 DIAGNOSIS — D500 Iron deficiency anemia secondary to blood loss (chronic): Secondary | ICD-10-CM

## 2014-12-08 DIAGNOSIS — K648 Other hemorrhoids: Secondary | ICD-10-CM | POA: Diagnosis not present

## 2014-12-08 DIAGNOSIS — K922 Gastrointestinal hemorrhage, unspecified: Secondary | ICD-10-CM | POA: Diagnosis not present

## 2014-12-08 DIAGNOSIS — D509 Iron deficiency anemia, unspecified: Secondary | ICD-10-CM

## 2014-12-08 LAB — CBC & DIFF AND RETIC
BASO%: 0.4 % (ref 0.0–2.0)
Basophils Absolute: 0 10*3/uL (ref 0.0–0.1)
EOS%: 2.2 % (ref 0.0–7.0)
Eosinophils Absolute: 0.2 10*3/uL (ref 0.0–0.5)
HCT: 43.9 % (ref 38.4–49.9)
HGB: 14.9 g/dL (ref 13.0–17.1)
Immature Retic Fract: 5.3 % (ref 3.00–10.60)
LYMPH%: 41.1 % (ref 14.0–49.0)
MCH: 33.3 pg (ref 27.2–33.4)
MCHC: 33.9 g/dL (ref 32.0–36.0)
MCV: 98.2 fL — ABNORMAL HIGH (ref 79.3–98.0)
MONO#: 0.7 10*3/uL (ref 0.1–0.9)
MONO%: 8.7 % (ref 0.0–14.0)
NEUT#: 3.9 10*3/uL (ref 1.5–6.5)
NEUT%: 47.6 % (ref 39.0–75.0)
Platelets: 298 10*3/uL (ref 140–400)
RBC: 4.47 10*6/uL (ref 4.20–5.82)
RDW: 13.8 % (ref 11.0–14.6)
Retic %: 1.55 % (ref 0.80–1.80)
Retic Ct Abs: 69.29 10*3/uL (ref 34.80–93.90)
WBC: 8.2 10*3/uL (ref 4.0–10.3)
lymph#: 3.4 10*3/uL — ABNORMAL HIGH (ref 0.9–3.3)

## 2014-12-08 LAB — FERRITIN CHCC: FERRITIN: 343 ng/mL — AB (ref 22–316)

## 2014-12-08 NOTE — Assessment & Plan Note (Signed)
The patient has iron deficiency anemia from GI bleed until proven otherwise. Since his hemorrhoidal surgery, the bleeding is less. His last IV iron infusion was in March 2016. The goal would be to keep ferritin level above 100. He does not need iron infusion now. I will schedule repeat blood work in 2 months and further iron infusion if needed.

## 2014-12-08 NOTE — Telephone Encounter (Signed)
Gave and pdrinted appt sched and avs for pt for July.Marland Kitchen

## 2014-12-08 NOTE — Assessment & Plan Note (Signed)
Since hemorrhoidectomy in December 2015, with a requirement for iron infusion has improved. Continue close observation and high-fiber diet.

## 2014-12-08 NOTE — Progress Notes (Signed)
[facility] OFFICE PROGRESS NOTE  MILLER, Lillette Boxer, MD SUMMARY OF HEMATOLOGIC HISTORY:  This is a pleasant gentleman who has extensive workup in 2010 for anemia. He had EGD colonoscopy and capsule endoscopy that came back negative. Bone marrow aspirate and biopsy was negative. The patient was also diagnosed with prostate cancer and underwent prostatectomy. He never require any adjuvant treatment. In December 2014, he was found to have significant iron deficiency anemia and received intravenous iron infusion In January 2015, repeat EGD and colonoscopy revealed internal hemorrhoids. Between December 2014 to November 2015, the patient received 6 bags of intravenous iron infusion.  He had hemorrhoidectomy in December 2015 INTERVAL HISTORY: Paul [name] 74 y.o. male returns for further follow-up. He feels fine. He denies recent fatigue. The patient denies any recent signs or symptoms of bleeding such as spontaneous epistaxis, hematuria or hematochezia.   I have reviewed the past medical history, past surgical history, social history and family history with the patient and they are unchanged from previous note.  ALLERGIES:  is allergic to sulfonamide derivatives.  MEDICATIONS:  Current Outpatient Prescriptions  Medication Sig Dispense Refill  . amLODipine (NORVASC) 5 MG tablet Take 1 tablet (5 mg total) by mouth daily. 90 tablet 3  . aspirin 81 MG tablet Take 81 mg by mouth daily.    Marland Kitchen atorvastatin (LIPITOR) 40 MG tablet Take 1 tablet (40 mg total) by mouth daily. 90 tablet 1  . Cholecalciferol (VITAMIN D3) 2000 UNITS TABS Take 2,000 Units by mouth daily.    . folic acid (FOLVITE) 633 MCG tablet Take 400 mcg by mouth daily.    Marland Kitchen lisinopril (PRINIVIL,ZESTRIL) 20 MG tablet Take 1 tablet (20 mg total) by mouth daily. 90 tablet 1  . vitamin B-12 (CYANOCOBALAMIN) 1000 MCG tablet Take 1,000 mcg by mouth daily.     No current facility-administered medications for this visit.      REVIEW OF SYSTEMS:   Constitutional: Denies fevers, chills or night sweats Eyes: Denies blurriness of vision Ears, nose, mouth, throat, and face: Denies mucositis or sore throat Respiratory: Denies cough, dyspnea or wheezes Cardiovascular: Denies palpitation, chest discomfort or lower extremity swelling Gastrointestinal:  Denies nausea, heartburn or change in bowel habits Skin: Denies abnormal skin rashes Lymphatics: Denies new lymphadenopathy or easy bruising Neurological:Denies numbness, tingling or new weaknesses Behavioral/Psych: Mood is stable, no new changes  All other systems were reviewed with the patient and are negative.  PHYSICAL EXAMINATION: ECOG PERFORMANCE STATUS: 0 - Asymptomatic  Filed Vitals:   12/08/14 1011  BP: 150/71  Pulse: 80  Temp: 97.7 F (36.5 C)  Resp: 18   Filed Weights   12/08/14 1011  Weight: 206 lb 3.2 oz (93.532 kg)    GENERAL:alert, no distress and comfortable SKIN: skin color, texture, turgor are normal, no rashes or significant lesions EYES: normal, Conjunctiva are pink and non-injected, sclera clear Musculoskeletal:no cyanosis of digits and no clubbing  NEURO: alert & oriented x 3 with fluent speech, no focal motor/sensory deficits  LABORATORY DATA:  I have reviewed the data as listed Results for orders placed or performed in visit on 12/08/14 (from the past 48 hour(s))  CBC & Diff and Retic     Status: Abnormal   Collection Time: 12/08/14  9:59 AM  Result Value Ref Range   WBC 8.2 4.0 - 10.3 10e3/uL   NEUT# 3.9 1.5 - 6.5 10e3/uL   HGB 14.9 13.0 - 17.1 g/dL   HCT 43.9 38.4 - 49.9 %  Platelets 298 140 - 400 10e3/uL   MCV 98.2 (H) 79.3 - 98.0 fL   MCH 33.3 27.2 - 33.4 pg   MCHC 33.9 32.0 - 36.0 g/dL   RBC 4.47 4.20 - 5.82 10e6/uL   RDW 13.8 11.0 - 14.6 %   lymph# 3.4 (H) 0.9 - 3.3 10e3/uL   MONO# 0.7 0.1 - 0.9 10e3/uL   Eosinophils Absolute 0.2 0.0 - 0.5 10e3/uL   Basophils Absolute 0.0 0.0 - 0.1 10e3/uL   NEUT% 47.6 39.0 -  75.0 %   LYMPH% 41.1 14.0 - 49.0 %   MONO% 8.7 0.0 - 14.0 %   EOS% 2.2 0.0 - 7.0 %   BASO% 0.4 0.0 - 2.0 %   Retic % 1.55 0.80 - 1.80 %   Retic Ct Abs 69.29 34.80 - 93.90 10e3/uL   Immature Retic Fract 5.30 3.00 - 10.60 %  Ferritin     Status: Abnormal   Collection Time: 12/08/14  9:59 AM  Result Value Ref Range   Ferritin 343 (H) 22 - 316 ng/ml    Lab Results  Component Value Date   WBC 8.2 12/08/2014   HGB 14.9 12/08/2014   HCT 43.9 12/08/2014   MCV 98.2* 12/08/2014   PLT 298 12/08/2014    ASSESSMENT & PLAN:  Iron deficiency anemia The patient has iron deficiency anemia from GI bleed until proven otherwise. Since his hemorrhoidal surgery, the bleeding is less. His last IV iron infusion was in March 2016. The goal would be to keep ferritin level above 100. He does not need iron infusion now. I will schedule repeat blood work in 2 months and further iron infusion if needed.   Hemorrhoids, internal Since hemorrhoidectomy in December 2015, with a requirement for iron infusion has improved. Continue close observation and high-fiber diet.    All questions were answered. The patient knows to call the clinic with any problems, questions or concerns. No barriers to learning was detected.  I spent 15 minutes counseling the patient face to face. The total time spent in the appointment was 20 minutes and more than 50% was on counseling.     Alvy Bimler, Kourtland Coopman, MD 5/5/20163:55 PM

## 2015-01-17 ENCOUNTER — Other Ambulatory Visit: Payer: Self-pay | Admitting: *Deleted

## 2015-01-17 DIAGNOSIS — I10 Essential (primary) hypertension: Secondary | ICD-10-CM

## 2015-01-17 MED ORDER — AMLODIPINE BESYLATE 5 MG PO TABS
5.0000 mg | ORAL_TABLET | Freq: Every day | ORAL | Status: DC
Start: 2015-01-17 — End: 2015-07-27

## 2015-02-03 ENCOUNTER — Other Ambulatory Visit: Payer: Self-pay | Admitting: Hematology and Oncology

## 2015-02-03 ENCOUNTER — Other Ambulatory Visit (HOSPITAL_BASED_OUTPATIENT_CLINIC_OR_DEPARTMENT_OTHER): Payer: Medicare Other

## 2015-02-03 ENCOUNTER — Telehealth: Payer: Self-pay | Admitting: Hematology and Oncology

## 2015-02-03 ENCOUNTER — Telehealth: Payer: Self-pay | Admitting: *Deleted

## 2015-02-03 DIAGNOSIS — D500 Iron deficiency anemia secondary to blood loss (chronic): Secondary | ICD-10-CM

## 2015-02-03 DIAGNOSIS — D509 Iron deficiency anemia, unspecified: Secondary | ICD-10-CM

## 2015-02-03 LAB — CBC & DIFF AND RETIC
BASO%: 0.3 % (ref 0.0–2.0)
Basophils Absolute: 0 10*3/uL (ref 0.0–0.1)
EOS ABS: 0.3 10*3/uL (ref 0.0–0.5)
EOS%: 3.7 % (ref 0.0–7.0)
HEMATOCRIT: 43.6 % (ref 38.4–49.9)
HGB: 14.8 g/dL (ref 13.0–17.1)
IMMATURE RETIC FRACT: 4.3 % (ref 3.00–10.60)
LYMPH#: 3.2 10*3/uL (ref 0.9–3.3)
LYMPH%: 41.5 % (ref 14.0–49.0)
MCH: 33.2 pg (ref 27.2–33.4)
MCHC: 33.9 g/dL (ref 32.0–36.0)
MCV: 97.8 fL (ref 79.3–98.0)
MONO#: 0.9 10*3/uL (ref 0.1–0.9)
MONO%: 11.1 % (ref 0.0–14.0)
NEUT%: 43.4 % (ref 39.0–75.0)
NEUTROS ABS: 3.4 10*3/uL (ref 1.5–6.5)
Platelets: 257 10*3/uL (ref 140–400)
RBC: 4.46 10*6/uL (ref 4.20–5.82)
RDW: 12.6 % (ref 11.0–14.6)
RETIC %: 1.01 % (ref 0.80–1.80)
RETIC CT ABS: 45.05 10*3/uL (ref 34.80–93.90)
WBC: 7.8 10*3/uL (ref 4.0–10.3)

## 2015-02-03 LAB — FERRITIN CHCC: Ferritin: 331 ng/ml — ABNORMAL HIGH (ref 22–316)

## 2015-02-03 NOTE — Telephone Encounter (Signed)
LVM for pt informing him of Dr. Calton Dach message below.  Asked him to call back if any questions.

## 2015-02-03 NOTE — Telephone Encounter (Signed)
-----   Message from Scheid Lark, MD sent at 02/03/2015 12:18 PM EDT ----- Regarding: labs look good I have cancelled his appointment next week and move to September POF is sent No need to see me or IV iron ----- Message -----    From: Lab in [facility] Interface    Sent: 02/03/2015  10:17 AM      To: Klier Lark, MD

## 2015-02-03 NOTE — Telephone Encounter (Signed)
gave and printed appt sched and avs for pt for Sept.... °

## 2015-02-10 ENCOUNTER — Ambulatory Visit: Payer: Medicare Other

## 2015-02-10 ENCOUNTER — Ambulatory Visit: Payer: Medicare Other | Admitting: Hematology and Oncology

## 2015-02-17 ENCOUNTER — Ambulatory Visit: Payer: Medicare Other

## 2015-04-28 ENCOUNTER — Other Ambulatory Visit (HOSPITAL_BASED_OUTPATIENT_CLINIC_OR_DEPARTMENT_OTHER): Payer: Medicare Other

## 2015-04-28 DIAGNOSIS — D509 Iron deficiency anemia, unspecified: Secondary | ICD-10-CM

## 2015-04-28 LAB — CBC & DIFF AND RETIC
BASO%: 0.3 % (ref 0.0–2.0)
Basophils Absolute: 0 10*3/uL (ref 0.0–0.1)
EOS ABS: 0.2 10*3/uL (ref 0.0–0.5)
EOS%: 2.8 % (ref 0.0–7.0)
HEMATOCRIT: 44.1 % (ref 38.4–49.9)
HGB: 14.9 g/dL (ref 13.0–17.1)
IMMATURE RETIC FRACT: 4.6 % (ref 3.00–10.60)
LYMPH#: 2.8 10*3/uL (ref 0.9–3.3)
LYMPH%: 43.7 % (ref 14.0–49.0)
MCH: 33.1 pg (ref 27.2–33.4)
MCHC: 33.8 g/dL (ref 32.0–36.0)
MCV: 98 fL (ref 79.3–98.0)
MONO#: 0.7 10*3/uL (ref 0.1–0.9)
MONO%: 11.1 % (ref 0.0–14.0)
NEUT%: 42.1 % (ref 39.0–75.0)
NEUTROS ABS: 2.7 10*3/uL (ref 1.5–6.5)
Platelets: 262 10*3/uL (ref 140–400)
RBC: 4.5 10*6/uL (ref 4.20–5.82)
RDW: 13 % (ref 11.0–14.6)
RETIC %: 1.49 % (ref 0.80–1.80)
RETIC CT ABS: 67.05 10*3/uL (ref 34.80–93.90)
WBC: 6.5 10*3/uL (ref 4.0–10.3)

## 2015-04-28 LAB — FERRITIN CHCC: FERRITIN: 310 ng/mL (ref 22–316)

## 2015-05-01 ENCOUNTER — Telehealth: Payer: Self-pay | Admitting: Hematology and Oncology

## 2015-05-01 ENCOUNTER — Other Ambulatory Visit: Payer: Self-pay | Admitting: Hematology and Oncology

## 2015-05-01 ENCOUNTER — Telehealth: Payer: Self-pay | Admitting: *Deleted

## 2015-05-01 NOTE — Telephone Encounter (Signed)
Informed pt of lab results good and no need for appt this week.  We will r/s him to see in 3 months w/ labs a few days before his office visit.  He agreed and verbalized understanding.

## 2015-05-01 NOTE — Telephone Encounter (Signed)
-----   Message from Seehafer Lark, MD sent at 05/01/2015  9:28 AM EDT ----- Regarding: labs His labs are completely normal Recommend DC appt this week and reschedule to 3 months If patient is ok with plan, let me know and I will place new POF

## 2015-05-01 NOTE — Telephone Encounter (Signed)
lvm for pt regarding to Dec appt....mailed pt appt sched and letter °

## 2015-05-03 ENCOUNTER — Other Ambulatory Visit: Payer: Self-pay

## 2015-05-03 NOTE — Telephone Encounter (Signed)
Last seen 11/08/14  Dr Sabra Heck  This med was not on [facility] list

## 2015-05-04 ENCOUNTER — Ambulatory Visit: Payer: Medicare Other | Admitting: Hematology and Oncology

## 2015-05-04 ENCOUNTER — Ambulatory Visit: Payer: Medicare Other

## 2015-05-04 MED ORDER — PANTOPRAZOLE SODIUM 40 MG PO TBEC: 40.0000 mg | DELAYED_RELEASE_TABLET | Freq: Every day | ORAL | Status: DC

## 2015-05-11 ENCOUNTER — Ambulatory Visit (INDEPENDENT_AMBULATORY_CARE_PROVIDER_SITE_OTHER): Payer: Medicare Other | Admitting: Family Medicine

## 2015-05-11 ENCOUNTER — Encounter: Payer: Self-pay | Admitting: Family Medicine

## 2015-05-11 VITALS — BP 144/82 | HR 71 | Temp 96.7°F | Ht 67.0 in | Wt 207.6 lb

## 2015-05-11 DIAGNOSIS — I10 Essential (primary) hypertension: Secondary | ICD-10-CM

## 2015-05-11 DIAGNOSIS — E785 Hyperlipidemia, unspecified: Secondary | ICD-10-CM

## 2015-05-11 DIAGNOSIS — Z23 Encounter for immunization: Secondary | ICD-10-CM | POA: Diagnosis not present

## 2015-05-11 NOTE — Progress Notes (Signed)
   Subjective:    Patient ID: Paul [name], male    DOB: Jun 19, 1941, 74 y.o.   MRN: [id number]  HPI 74 year old man here to follow-up hypertension, hyperlipidemia,. He also has a history of prostate cancer saw urologist one month ago. He is status post prostatectomy PSA was present but barely perceptible. Urologist is increasing decreasing frequency of visits from every 6 months to once a year now. He has no complaints or symptoms today. This the statin as tolerated  Patient Active Problem List   Diagnosis Date Noted  . Hemorrhoids, internal 12/24/2013  . Need for Tdap vaccination 11/23/2013  . Adenocarcinoma of prostate ([place])   . Kidney stones   . Iron deficiency anemia 10/16/2012  . Hyperlipidemia 04/05/2010  . Overweight(278.02) 04/05/2010  . Essential hypertension, benign 04/05/2010  . FATIGUE / MALAISE 04/05/2010  . ABNORMAL ELECTROCARDIOGRAM 04/05/2010   Outpatient Encounter Prescriptions as of 05/11/2015  Medication Sig  . amLODipine (NORVASC) 5 MG tablet Take 1 tablet (5 mg total) by mouth daily.  Marland Kitchen aspirin 81 MG tablet Take 81 mg by mouth daily.  Marland Kitchen atorvastatin (LIPITOR) 40 MG tablet Take 1 tablet (40 mg total) by mouth daily.  . Cholecalciferol (VITAMIN D3) 2000 UNITS TABS Take 2,000 Units by mouth daily.  Marland Kitchen lisinopril (PRINIVIL,ZESTRIL) 20 MG tablet Take 1 tablet (20 mg total) by mouth daily.  . pantoprazole (PROTONIX) 40 MG tablet Take 1 tablet (40 mg total) by mouth daily.  . vitamin B-12 (CYANOCOBALAMIN) 1000 MCG tablet Take 1,000 mcg by mouth daily.  . [DISCONTINUED] folic acid (FOLVITE) 683 MCG tablet Take 400 mcg by mouth daily.   No facility-administered encounter medications on file as of 05/11/2015.      Review of Systems  Constitutional: Negative.   HENT: Negative.   Respiratory: Negative.   Cardiovascular: Negative.   Neurological: Negative.   Psychiatric/Behavioral: Negative.        Objective:   Physical Exam  Constitutional: He is oriented to  person, place, and time. He appears well-developed and well-nourished.  HENT:  Head: Normocephalic.  Cardiovascular: Normal rate, regular rhythm and normal heart sounds.   Pulmonary/Chest: He is in respiratory distress.  Abdominal: Soft.  Musculoskeletal: Normal range of motion.  Neurological: He is alert and oriented to person, place, and time.  Psychiatric: He has a normal mood and affect. Thought content normal.          Assessment & Plan:  1. Encounter for immunization Patient got flu shot today  2. Hyperlipidemia Lipids were at goal when checked in April with LDL at 84 and HDL and 56.  3. Essential hypertension, benign Blood pressure is mildly elevated today but I would continue with amlodipine and lisinopril at current doses and follow.  Wardell Honour MD

## 2015-05-26 ENCOUNTER — Ambulatory Visit (INDEPENDENT_AMBULATORY_CARE_PROVIDER_SITE_OTHER): Payer: Medicare Other | Admitting: *Deleted

## 2015-05-26 VITALS — BP 135/70

## 2015-05-26 DIAGNOSIS — I10 Essential (primary) hypertension: Secondary | ICD-10-CM

## 2015-05-26 NOTE — Progress Notes (Signed)
Pt here for BP check

## 2015-06-06 DEATH — deceased

## 2015-06-22 ENCOUNTER — Other Ambulatory Visit: Payer: Self-pay | Admitting: Family Medicine

## 2015-06-23 ENCOUNTER — Other Ambulatory Visit: Payer: Self-pay | Admitting: Hematology and Oncology

## 2015-07-18 ENCOUNTER — Other Ambulatory Visit (HOSPITAL_BASED_OUTPATIENT_CLINIC_OR_DEPARTMENT_OTHER): Payer: Medicare Other

## 2015-07-18 DIAGNOSIS — D509 Iron deficiency anemia, unspecified: Secondary | ICD-10-CM

## 2015-07-18 LAB — CBC & DIFF AND RETIC
BASO%: 0.4 % (ref 0.0–2.0)
Basophils Absolute: 0 10*3/uL (ref 0.0–0.1)
EOS ABS: 0.2 10*3/uL (ref 0.0–0.5)
EOS%: 2.7 % (ref 0.0–7.0)
HCT: 46.1 % (ref 38.4–49.9)
HEMOGLOBIN: 15.5 g/dL (ref 13.0–17.1)
IMMATURE RETIC FRACT: 2.6 % — AB (ref 3.00–10.60)
LYMPH#: 3.3 10*3/uL (ref 0.9–3.3)
LYMPH%: 40.6 % (ref 14.0–49.0)
MCH: 33.1 pg (ref 27.2–33.4)
MCHC: 33.6 g/dL (ref 32.0–36.0)
MCV: 98.5 fL — ABNORMAL HIGH (ref 79.3–98.0)
MONO#: 0.8 10*3/uL (ref 0.1–0.9)
MONO%: 10 % (ref 0.0–14.0)
NEUT%: 46.3 % (ref 39.0–75.0)
NEUTROS ABS: 3.8 10*3/uL (ref 1.5–6.5)
Platelets: 290 10*3/uL (ref 140–400)
RBC: 4.68 10*6/uL (ref 4.20–5.82)
RDW: 12.9 % (ref 11.0–14.6)
RETIC CT ABS: 52.88 10*3/uL (ref 34.80–93.90)
Retic %: 1.13 % (ref 0.80–1.80)
WBC: 8.2 10*3/uL (ref 4.0–10.3)

## 2015-07-18 LAB — FERRITIN: FERRITIN: 258 ng/mL (ref 22–316)

## 2015-07-19 ENCOUNTER — Telehealth: Payer: Self-pay | Admitting: *Deleted

## 2015-07-19 ENCOUNTER — Other Ambulatory Visit: Payer: Self-pay | Admitting: Hematology and Oncology

## 2015-07-19 ENCOUNTER — Telehealth: Payer: Self-pay | Admitting: Hematology and Oncology

## 2015-07-19 NOTE — Telephone Encounter (Signed)
LM with note below. Asked to call us back if OK to reschedule for 3 months.Paul [name]

## 2015-07-19 NOTE — Telephone Encounter (Signed)
s.w. pt wife and advised on March 2017...Marland Kitchenok and aware

## 2015-07-19 NOTE — Telephone Encounter (Signed)
-----   Message from Camberos Lark, MD sent at 07/18/2015  2:15 PM EST ----- Regarding: stable CBC His blood tests look great If OK with patient, will cancel his appt this month and see him back in 3 months If OK with him, please let me know and I will put in new orders ----- Message -----    From: [facility]    Sent: 07/18/2015  10:18 AM      To: Clare Lark, MD

## 2015-07-19 NOTE — Telephone Encounter (Signed)
-----   Message from Ramirez Lark, MD sent at 07/18/2015  2:15 PM EST ----- Regarding: stable CBC His blood tests look great If OK with patient, will cancel his appt this month and see him back in 3 months If OK with him, please let me know and I will put in new orders ----- Message -----    From: [facility]    Sent: 07/18/2015  10:18 AM      To: Hildenbrand Lark, MD

## 2015-07-19 NOTE — Telephone Encounter (Signed)
-----   Message from Shatzer Lark, MD sent at 07/18/2015  2:15 PM EST ----- Regarding: stable CBC His blood tests look great If OK with patient, will cancel his appt this month and see him back in 3 months If OK with him, please let me know and I will put in new orders ----- Message -----    From: [facility]    Sent: 07/18/2015  10:18 AM      To: Bouch Lark, MD

## 2015-07-19 NOTE — Telephone Encounter (Signed)
Pt states he is doing well. OK to reschedule for 3 months

## 2015-07-25 ENCOUNTER — Ambulatory Visit: Payer: Medicare Other

## 2015-07-25 ENCOUNTER — Ambulatory Visit: Payer: Medicare Other | Admitting: Hematology and Oncology

## 2015-07-26 ENCOUNTER — Other Ambulatory Visit: Payer: Self-pay | Admitting: Family Medicine

## 2015-07-27 ENCOUNTER — Other Ambulatory Visit: Payer: Self-pay | Admitting: Family Medicine

## 2015-08-01 ENCOUNTER — Ambulatory Visit: Payer: Medicare Other

## 2015-08-09 ENCOUNTER — Ambulatory Visit (INDEPENDENT_AMBULATORY_CARE_PROVIDER_SITE_OTHER): Payer: Medicare Other | Admitting: Family Medicine

## 2015-08-09 ENCOUNTER — Encounter: Payer: Self-pay | Admitting: Family Medicine

## 2015-08-09 VITALS — BP 124/73 | HR 94 | Temp 98.5°F | Ht 67.0 in | Wt 204.6 lb

## 2015-08-09 DIAGNOSIS — J019 Acute sinusitis, unspecified: Secondary | ICD-10-CM

## 2015-08-09 MED ORDER — CETIRIZINE HCL 10 MG PO CHEW: 10.0000 mg | CHEWABLE_TABLET | Freq: Every day | ORAL | Status: DC

## 2015-08-09 MED ORDER — FLUTICASONE PROPIONATE 50 MCG/ACT NA SUSP: 1.0000 | Freq: Two times a day (BID) | NASAL | Status: DC | PRN

## 2015-08-09 NOTE — Progress Notes (Signed)
BP 124/73 mmHg  Pulse 94  Temp(Src) 98.5 F (36.9 C) (Oral)  Ht 5\' 7"  (1.702 m)  Wt 204 lb 9.6 oz (92.806 kg)  BMI 32.04 kg/m2   Subjective:    Patient ID: Paul [name], male    DOB: September 05, 1940, 75 y.o.   MRN: [id number]  HPI: Paul [name] is a 75 y.o. male presenting on 08/09/2015 for URI   HPI Sinus congestion and nasal drainage and sore throat Patient comes in because he has been having 5 days of sinus congestion and postnasal drainage and sore throat and a bad cough which is worse at night. His cough is mostly nonproductive and dry. He denies any fevers or chills or shortness of breath or wheezing. His wife was ill prior to him getting this illness. He has tried some Robitussin but nothing else. It helped some but not much.  Relevant past medical, surgical, family and social history reviewed and updated as indicated. Interim medical history since our last visit reviewed. Allergies and medications reviewed and updated.  Review of Systems  Constitutional: Negative for fever and chills.  HENT: Positive for congestion, postnasal drip, rhinorrhea, sinus pressure, sneezing and sore throat. Negative for ear discharge, ear pain and voice change.   Eyes: Negative for pain, discharge, redness and visual disturbance.  Respiratory: Negative for shortness of breath and wheezing.   Cardiovascular: Negative for chest pain and leg swelling.  Gastrointestinal: Negative for abdominal pain, diarrhea and constipation.  Genitourinary: Negative for difficulty urinating.  Musculoskeletal: Negative for back pain and gait problem.  Skin: Negative for rash.  Neurological: Negative for syncope, light-headedness and headaches.  All other systems reviewed and are negative.   Per HPI unless specifically indicated above     Medication List       This list is accurate as of: 08/09/15  4:55 PM.  Always use your most recent med list.               amLODipine 5 MG tablet  Commonly known as:   NORVASC  TAKE ONE TABLET BY MOUTH ONCE DAILY     aspirin 81 MG tablet  Take 81 mg by mouth daily.     atorvastatin 40 MG tablet  Commonly known as:  LIPITOR  TAKE ONE TABLET BY MOUTH ONCE DAILY     cetirizine 10 MG chewable tablet  Commonly known as:  ZYRTEC CHILDRENS ALLERGY  Chew 1 tablet (10 mg total) by mouth daily.     fluticasone 50 MCG/ACT nasal spray  Commonly known as:  FLONASE  Place 1 spray into both nostrils 2 (two) times daily as needed for allergies or rhinitis.     lisinopril 20 MG tablet  Commonly known as:  PRINIVIL,ZESTRIL  TAKE ONE TABLET BY MOUTH ONCE DAILY     pantoprazole 40 MG tablet  Commonly known as:  PROTONIX  Take 1 tablet (40 mg total) by mouth daily.     vitamin B-12 1000 MCG tablet  Commonly known as:  CYANOCOBALAMIN  Take 1,000 mcg by mouth daily.     Vitamin D3 2000 units Tabs  Take 2,000 Units by mouth daily.           Objective:    BP 124/73 mmHg  Pulse 94  Temp(Src) 98.5 F (36.9 C) (Oral)  Ht 5\' 7"  (1.702 m)  Wt 204 lb 9.6 oz (92.806 kg)  BMI 32.04 kg/m2  Wt Readings from Last 3 Encounters:  08/09/15 204 lb 9.6 oz (92.806 kg)  05/11/15 207 lb 9.6 oz (94.167 kg)  12/08/14 206 lb 3.2 oz (93.532 kg)    Physical Exam  Constitutional: He is oriented to person, place, and time. He appears well-developed and well-nourished. No distress.  HENT:  Right Ear: Tympanic membrane, external ear and ear canal normal.  Left Ear: Tympanic membrane, external ear and ear canal normal.  Nose: Mucosal edema and rhinorrhea present. No sinus tenderness. No epistaxis. Right sinus exhibits frontal sinus tenderness. Right sinus exhibits no maxillary sinus tenderness. Left sinus exhibits frontal sinus tenderness. Left sinus exhibits no maxillary sinus tenderness.  Mouth/Throat: Uvula is midline and mucous membranes are normal. Posterior oropharyngeal edema and posterior oropharyngeal erythema present. No oropharyngeal exudate or tonsillar abscesses.    Eyes: Conjunctivae and EOM are normal. Pupils are equal, round, and reactive to light. Right eye exhibits no discharge. No scleral icterus.  Cardiovascular: Normal rate, regular rhythm, normal heart sounds and intact distal pulses.   No murmur heard. Pulmonary/Chest: Effort normal and breath sounds normal. No respiratory distress. He has no wheezes.  Abdominal: He exhibits no distension.  Musculoskeletal: Normal range of motion. He exhibits no edema.  Neurological: He is alert and oriented to person, place, and time. Coordination normal.  Skin: Skin is warm and dry. No rash noted. He is not diaphoretic.  Psychiatric: He has a normal mood and affect. His behavior is normal.  Vitals reviewed.       Assessment & Plan:   Problem List Items Addressed This Visit    None    Visit Diagnoses    Acute rhinosinusitis    -  Primary    Relevant Medications    fluticasone (FLONASE) 50 MCG/ACT nasal spray    cetirizine (ZYRTEC CHILDRENS ALLERGY) 10 MG chewable tablet        Follow up plan: Return if symptoms worsen or fail to improve.  Counseling provided for all of the vaccine components No orders of the defined types were placed in this encounter.    Caryl Pina, MD [facility] 08/09/2015, 4:55 PM

## 2015-09-22 ENCOUNTER — Other Ambulatory Visit: Payer: Self-pay | Admitting: Family Medicine

## 2015-10-13 ENCOUNTER — Other Ambulatory Visit (HOSPITAL_BASED_OUTPATIENT_CLINIC_OR_DEPARTMENT_OTHER): Payer: Medicare Other

## 2015-10-13 ENCOUNTER — Other Ambulatory Visit: Payer: Self-pay | Admitting: Hematology and Oncology

## 2015-10-13 ENCOUNTER — Telehealth: Payer: Self-pay | Admitting: *Deleted

## 2015-10-13 DIAGNOSIS — D509 Iron deficiency anemia, unspecified: Secondary | ICD-10-CM

## 2015-10-13 LAB — FERRITIN: Ferritin: 280 ng/ml (ref 22–316)

## 2015-10-13 LAB — CBC & DIFF AND RETIC
BASO%: 0.3 % (ref 0.0–2.0)
Basophils Absolute: 0 10*3/uL (ref 0.0–0.1)
EOS ABS: 0.2 10*3/uL (ref 0.0–0.5)
EOS%: 3.2 % (ref 0.0–7.0)
HCT: 44.6 % (ref 38.4–49.9)
HEMOGLOBIN: 14.7 g/dL (ref 13.0–17.1)
IMMATURE RETIC FRACT: 3.8 % (ref 3.00–10.60)
LYMPH%: 41.5 % (ref 14.0–49.0)
MCH: 32.3 pg (ref 27.2–33.4)
MCHC: 33 g/dL (ref 32.0–36.0)
MCV: 98 fL (ref 79.3–98.0)
MONO#: 0.8 10*3/uL (ref 0.1–0.9)
MONO%: 11 % (ref 0.0–14.0)
NEUT%: 44 % (ref 39.0–75.0)
NEUTROS ABS: 3 10*3/uL (ref 1.5–6.5)
PLATELETS: 302 10*3/uL (ref 140–400)
RBC: 4.55 10*6/uL (ref 4.20–5.82)
RDW: 13.2 % (ref 11.0–14.6)
Retic %: 1.33 % (ref 0.80–1.80)
Retic Ct Abs: 60.52 10*3/uL (ref 34.80–93.90)
WBC: 6.9 10*3/uL (ref 4.0–10.3)
lymph#: 2.9 10*3/uL (ref 0.9–3.3)

## 2015-10-13 NOTE — Telephone Encounter (Signed)
LVM for pt on home and cell asking him to return nurse's call to discuss Dr. Calton Dach message below.

## 2015-10-13 NOTE — Telephone Encounter (Signed)
-----   Message from Fialkowski Lark, MD sent at 10/13/2015 11:51 AM EST ----- Regarding: normal blood work Pls tell him he has not needed IV iron for 1 year and his problem has resolved I will cancel his future appt No need to come back Follow with PCP only ----- Message -----    From: [facility]    Sent: 10/13/2015  10:00 AM      To: Bold Lark, MD

## 2015-10-16 ENCOUNTER — Telehealth: Payer: Self-pay | Admitting: *Deleted

## 2015-10-16 NOTE — Telephone Encounter (Signed)
-----   Message from Weiand Lark, MD sent at 10/13/2015 11:51 AM EST ----- Regarding: normal blood work Pls tell him he has not needed IV iron for 1 year and his problem has resolved I will cancel his future appt No need to come back Follow with PCP only ----- Message -----    From: [facility]    Sent: 10/13/2015  10:00 AM      To: Tallarico Lark, MD

## 2015-10-16 NOTE — Telephone Encounter (Signed)
Informed wife of Dr. Calton Dach message below.  She verbalized understanding and will have pt follow w/ PCP.  Instructed her to call us if any problems or changes in the future.  She verbalized understanding/ agreement of this plan.

## 2015-10-20 ENCOUNTER — Ambulatory Visit: Payer: Medicare Other

## 2015-10-20 ENCOUNTER — Ambulatory Visit: Payer: Medicare Other | Admitting: Hematology and Oncology

## 2015-11-01 ENCOUNTER — Other Ambulatory Visit: Payer: Self-pay | Admitting: Family Medicine

## 2015-11-16 ENCOUNTER — Encounter: Payer: Self-pay | Admitting: Family Medicine

## 2015-11-16 ENCOUNTER — Ambulatory Visit (INDEPENDENT_AMBULATORY_CARE_PROVIDER_SITE_OTHER): Payer: Medicare Other | Admitting: Family Medicine

## 2015-11-16 VITALS — BP 129/81 | HR 80 | Temp 96.8°F | Ht 67.0 in | Wt 203.8 lb

## 2015-11-16 DIAGNOSIS — E785 Hyperlipidemia, unspecified: Secondary | ICD-10-CM

## 2015-11-16 DIAGNOSIS — I10 Essential (primary) hypertension: Secondary | ICD-10-CM

## 2015-11-16 LAB — CMP14+EGFR
A/G RATIO: 1.3 (ref 1.2–2.2)
ALT: 21 IU/L (ref 0–44)
AST: 22 IU/L (ref 0–40)
Albumin: 4.1 g/dL (ref 3.5–4.8)
Alkaline Phosphatase: 134 IU/L — ABNORMAL HIGH (ref 39–117)
BILIRUBIN TOTAL: 0.5 mg/dL (ref 0.0–1.2)
BUN/Creatinine Ratio: 14 (ref 10–24)
BUN: 14 mg/dL (ref 8–27)
CHLORIDE: 97 mmol/L (ref 96–106)
CO2: 24 mmol/L (ref 18–29)
Calcium: 9.8 mg/dL (ref 8.6–10.2)
Creatinine, Ser: 1.03 mg/dL (ref 0.76–1.27)
GFR calc non Af Amer: 71 mL/min/{1.73_m2} (ref >59)
GFR, EST AFRICAN AMERICAN: 82 mL/min/{1.73_m2} (ref >59)
GLOBULIN, TOTAL: 3.1 g/dL (ref 1.5–4.5)
Glucose: 122 mg/dL — ABNORMAL HIGH (ref 65–99)
POTASSIUM: 4.6 mmol/L (ref 3.5–5.2)
SODIUM: 138 mmol/L (ref 134–144)
TOTAL PROTEIN: 7.2 g/dL (ref 6.0–8.5)

## 2015-11-16 LAB — LIPID PANEL
Chol/HDL Ratio: 3.3 ratio units (ref 0.0–5.0)
Cholesterol, Total: 166 mg/dL (ref 100–199)
HDL: 50 mg/dL (ref >39)
LDL Calculated: 88 mg/dL (ref 0–99)
Triglycerides: 139 mg/dL (ref 0–149)
VLDL Cholesterol Cal: 28 mg/dL (ref 5–40)

## 2015-11-16 MED ORDER — LISINOPRIL 20 MG PO TABS: 20.0000 mg | ORAL_TABLET | Freq: Every day | ORAL | Status: DC

## 2015-11-16 MED ORDER — ATORVASTATIN CALCIUM 40 MG PO TABS: 40.0000 mg | ORAL_TABLET | Freq: Every day | ORAL | Status: DC

## 2015-11-16 MED ORDER — PANTOPRAZOLE SODIUM 40 MG PO TBEC: 40.0000 mg | DELAYED_RELEASE_TABLET | Freq: Every day | ORAL | Status: DC

## 2015-11-16 MED ORDER — AMLODIPINE BESYLATE 5 MG PO TABS: 5.0000 mg | ORAL_TABLET | Freq: Every day | ORAL | Status: DC

## 2015-11-16 NOTE — Progress Notes (Signed)
   Subjective:    Patient ID: Paul [name], male    DOB: Jan 30, 1941, 75 y.o.   MRN: [id number]  HPI 75 year old woman here for follow-up, primarily of lipids, blood pressure, and borderline sugar. Since his last visit 6 months ago. He has had no new complaints or symptoms. 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 There is also diagnosis of prostate cancer that is followed by urology.  Patient Active Problem List   Diagnosis Date Noted  . Hemorrhoids, internal 12/24/2013  . Need for Tdap vaccination 11/23/2013  . Adenocarcinoma of prostate ([place])   . Kidney stones   . Iron deficiency anemia 10/16/2012  . Hyperlipidemia 04/05/2010  . Overweight(278.02) 04/05/2010  . Essential hypertension, benign 04/05/2010  . FATIGUE / MALAISE 04/05/2010  . ABNORMAL ELECTROCARDIOGRAM 04/05/2010   Outpatient Encounter Prescriptions as of 11/16/2015  Medication Sig  . amLODipine (NORVASC) 5 MG tablet TAKE ONE TABLET BY MOUTH ONCE DAILY  . aspirin 81 MG tablet Take 81 mg by mouth daily.  Marland Kitchen atorvastatin (LIPITOR) 40 MG tablet TAKE ONE TABLET BY MOUTH ONCE DAILY  . Cholecalciferol (VITAMIN D3) 2000 UNITS TABS Take 2,000 Units by mouth daily.  Marland Kitchen lisinopril (PRINIVIL,ZESTRIL) 20 MG tablet TAKE ONE TABLET BY MOUTH ONCE DAILY  . pantoprazole (PROTONIX) 40 MG tablet Take 1 tablet (40 mg total) by mouth daily.  . [DISCONTINUED] cetirizine (ZYRTEC CHILDRENS ALLERGY) 10 MG chewable tablet Chew 1 tablet (10 mg total) by mouth daily. (Patient not taking: Reported on 11/16/2015)  . [DISCONTINUED] fluticasone (FLONASE) 50 MCG/ACT nasal spray Place 1 spray into both nostrils 2 (two) times daily as needed for allergies or rhinitis.  . [DISCONTINUED] vitamin B-12 (CYANOCOBALAMIN) 1000 MCG tablet Take 1,000 mcg by mouth daily.   No facility-administered encounter medications on file as of  11/16/2015.      Review of Systems  Constitutional: Negative.   Respiratory: Negative.   Cardiovascular: Negative.   Genitourinary: Negative.   Neurological: Negative.   Psychiatric/Behavioral: Negative.        Objective:   Physical Exam  Constitutional: He is oriented to person, place, and time. He appears well-developed and well-nourished.  Cardiovascular: Normal rate, regular rhythm, normal heart sounds and intact distal pulses.   Pulmonary/Chest: Effort normal and breath sounds normal.  Abdominal: Soft. He exhibits no mass. There is no tenderness.  Neurological: He is alert and oriented to person, place, and time.          Assessment & Plan:  1. Hyperlipidemia As were checked last one year ago. LDL was at goal and other parameters were also normal.  2. Essential hypertension, benign Pressure well controlled on current regimen of amlodipine and lisinopril.   Wardell Honour MD

## 2016-04-22 DIAGNOSIS — Z8546 Personal history of malignant neoplasm of prostate: Secondary | ICD-10-CM | POA: Diagnosis not present

## 2016-04-22 DIAGNOSIS — C61 Malignant neoplasm of prostate: Secondary | ICD-10-CM | POA: Diagnosis not present

## 2016-04-30 ENCOUNTER — Encounter: Payer: Self-pay | Admitting: *Deleted

## 2016-05-14 ENCOUNTER — Ambulatory Visit: Payer: Medicare Other | Admitting: Family Medicine

## 2016-05-17 ENCOUNTER — Ambulatory Visit: Payer: Medicare Other | Admitting: Family Medicine

## 2016-05-22 ENCOUNTER — Encounter: Payer: Self-pay | Admitting: Family Medicine

## 2016-05-22 ENCOUNTER — Ambulatory Visit (INDEPENDENT_AMBULATORY_CARE_PROVIDER_SITE_OTHER): Payer: Medicare Other | Admitting: Family Medicine

## 2016-05-22 VITALS — BP 124/70 | HR 83 | Temp 97.3°F | Ht 67.0 in | Wt 203.0 lb

## 2016-05-22 DIAGNOSIS — Z23 Encounter for immunization: Secondary | ICD-10-CM

## 2016-05-22 DIAGNOSIS — I10 Essential (primary) hypertension: Secondary | ICD-10-CM

## 2016-05-22 MED ORDER — PANTOPRAZOLE SODIUM 40 MG PO TBEC
40.0000 mg | DELAYED_RELEASE_TABLET | Freq: Every day | ORAL | 1 refills | Status: DC
Start: 2016-05-22 — End: 2016-11-20

## 2016-05-22 MED ORDER — LOSARTAN POTASSIUM 50 MG PO TABS: 50.0000 mg | ORAL_TABLET | Freq: Every day | ORAL | 1 refills | Status: DC

## 2016-05-22 MED ORDER — AMLODIPINE BESYLATE 5 MG PO TABS: 5.0000 mg | ORAL_TABLET | Freq: Every day | ORAL | 1 refills | Status: DC

## 2016-05-22 MED ORDER — ATORVASTATIN CALCIUM 40 MG PO TABS: 40.0000 mg | ORAL_TABLET | Freq: Every day | ORAL | 1 refills | Status: DC

## 2016-05-22 MED ORDER — LISINOPRIL 20 MG PO TABS: 20.0000 mg | ORAL_TABLET | Freq: Every day | ORAL | 1 refills | Status: DC

## 2016-05-22 NOTE — Progress Notes (Signed)
   Subjective:    Patient ID: Paul [name], male    DOB: [birth year], 75 y.o.   MRN: [id number]  HPI 75 year old gentleman here to follow-up hypertension and hyperlipidemia. Lipids were last assessed 6 months ago and were at goal so poorly we can wait to recheck lipids. Blood pressure is good at 124/70 but he has developed a dry nonproductive cough. He is taking lisinopril as well as amlodipine area he has no dependent edema on the 5 mg dose of amlodipine but he did when he was taking 10 mg. Also asked about screening for lung cancer. He has a 25-pack-year history which I do not think meets criteria for screening exam.  Patient Active Problem List   Diagnosis Date Noted  . Hemorrhoids, internal 12/24/2013  . Need for Tdap vaccination 11/23/2013  . Adenocarcinoma of prostate ([place])   . Kidney stones   . Iron deficiency anemia 10/16/2012  . Hyperlipidemia 04/05/2010  . Overweight(278.02) 04/05/2010  . Essential hypertension, benign 04/05/2010  . FATIGUE / MALAISE 04/05/2010  . ABNORMAL ELECTROCARDIOGRAM 04/05/2010   Outpatient Encounter Prescriptions as of 05/22/2016  Medication Sig  . amLODipine (NORVASC) 5 MG tablet Take 1 tablet (5 mg total) by mouth daily.  Marland Kitchen aspirin 81 MG tablet Take 81 mg by mouth daily.  Marland Kitchen atorvastatin (LIPITOR) 40 MG tablet Take 1 tablet (40 mg total) by mouth daily.  . Cholecalciferol (VITAMIN D3) 2000 UNITS TABS Take 2,000 Units by mouth daily.  Marland Kitchen lisinopril (PRINIVIL,ZESTRIL) 20 MG tablet Take 1 tablet (20 mg total) by mouth daily.  . pantoprazole (PROTONIX) 40 MG tablet Take 1 tablet (40 mg total) by mouth daily.   No facility-administered encounter medications on file as of 05/22/2016.       Review of Systems  Respiratory: Positive for cough.   Cardiovascular: Negative.   Neurological: Negative.   Psychiatric/Behavioral: Negative.        Objective:   Physical Exam  Constitutional: He is oriented to person, place, and time. He appears  well-developed and well-nourished.  Cardiovascular: Normal rate, regular rhythm and normal heart sounds.   Pulmonary/Chest: Effort normal and breath sounds normal.  Neurological: He is alert and oriented to person, place, and time.  Psychiatric: He has a normal mood and affect. His behavior is normal.   BP 124/70   Pulse 83   Temp 97.3 F (36.3 C) (Oral)   Ht 5\' 7"  (1.702 m)   Wt 203 lb (92.1 kg)   BMI 31.79 kg/m         Assessment & Plan:  1. Essential hypertension, benign Discontinue lisinopril and begin losartan 50 mg. Hopefully this will have a positive effect on his cough - pantoprazole (PROTONIX) 40 MG tablet; Take 1 tablet (40 mg total) by mouth daily.  Dispense: 90 tablet; Refill: 1 - atorvastatin (LIPITOR) 40 MG tablet; Take 1 tablet (40 mg total) by mouth daily.  Dispense: 90 tablet; Refill: 1 - amLODipine (NORVASC) 5 MG tablet; Take 1 tablet (5 mg total) by mouth daily.  Dispense: 90 tablet; Refill: 1  2. Encounter for immunization  - Flu vaccine HIGH DOSE PF  Wardell Honour MD

## 2016-09-18 ENCOUNTER — Encounter: Payer: Self-pay | Admitting: *Deleted

## 2016-11-20 ENCOUNTER — Encounter (INDEPENDENT_AMBULATORY_CARE_PROVIDER_SITE_OTHER): Payer: Self-pay

## 2016-11-20 ENCOUNTER — Encounter: Payer: Self-pay | Admitting: Family Medicine

## 2016-11-20 ENCOUNTER — Ambulatory Visit: Payer: Medicare Other | Admitting: Family Medicine

## 2016-11-20 ENCOUNTER — Ambulatory Visit (INDEPENDENT_AMBULATORY_CARE_PROVIDER_SITE_OTHER): Payer: Medicare Other | Admitting: Family Medicine

## 2016-11-20 VITALS — BP 142/74 | HR 72 | Temp 97.0°F | Ht 67.0 in | Wt 200.0 lb

## 2016-11-20 DIAGNOSIS — I10 Essential (primary) hypertension: Secondary | ICD-10-CM

## 2016-11-20 DIAGNOSIS — E663 Overweight: Secondary | ICD-10-CM | POA: Diagnosis not present

## 2016-11-20 DIAGNOSIS — K219 Gastro-esophageal reflux disease without esophagitis: Secondary | ICD-10-CM

## 2016-11-20 DIAGNOSIS — E782 Mixed hyperlipidemia: Secondary | ICD-10-CM | POA: Diagnosis not present

## 2016-11-20 DIAGNOSIS — Z23 Encounter for immunization: Secondary | ICD-10-CM

## 2016-11-20 MED ORDER — AMLODIPINE BESYLATE 5 MG PO TABS: 5.0000 mg | ORAL_TABLET | Freq: Every day | ORAL | 1 refills | Status: DC

## 2016-11-20 MED ORDER — LOSARTAN POTASSIUM 50 MG PO TABS: 50.0000 mg | ORAL_TABLET | Freq: Every day | ORAL | 1 refills | Status: DC

## 2016-11-20 MED ORDER — PANTOPRAZOLE SODIUM 40 MG PO TBEC: 40.0000 mg | DELAYED_RELEASE_TABLET | Freq: Every day | ORAL | 1 refills | Status: DC

## 2016-11-20 MED ORDER — ATORVASTATIN CALCIUM 40 MG PO TABS: 40.0000 mg | ORAL_TABLET | Freq: Every day | ORAL | 1 refills | Status: DC

## 2016-11-20 NOTE — Progress Notes (Signed)
BP (!) 142/74   Pulse 72   Temp 97 F (36.1 C) (Oral)   Ht 5' 7"  (1.702 m)   Wt 200 lb (90.7 kg)   BMI 31.32 kg/m    Subjective:    Patient ID: QUINNTON BURY, male    DOB: 05/31/1941, 76 y.o.   MRN: [id number]  HPI: DENZAL MEIR is a 76 y.o. male presenting on 11/20/2016 for Hyperlipidemia (followup; patient is fasting; has appointment in August to see urologist) and Hypertension   HPI Hypertension recheck  Patient is coming in for hypertension recheck today. His blood pressures 142/74. He says at home it typically runs in the 120s over 60s usually comes up a little bit when he is here in the office. Patient denies headaches, blurred vision, chest pains, shortness of breath, or weakness. Denies any side effects from medication and is content with current medication.   Hyperlipidemia Patient is coming in for recheck of his hyperlipidemia. He is currently taking Lipitor 40 mg, he denies any muscle aches or pains.. He denies any issues with myalgias or history of liver damage from it. He denies any focal numbness or weakness or chest pain.   GERD recheck Patient is currently on proton extends as it is working very well for him. He denies any abdominal issues or bowel issues. He denies any acid reflux or heartburn.   Relevant past medical, surgical, family and social history reviewed and updated as indicated. Interim medical history since our last visit reviewed. Allergies and medications reviewed and updated.  Review of Systems  Constitutional: Negative for chills and fever.  Respiratory: Negative for shortness of breath and wheezing.   Cardiovascular: Negative for chest pain and leg swelling.  Gastrointestinal: Negative for abdominal pain, constipation, diarrhea and vomiting.  Musculoskeletal: Negative for back pain and gait problem.  Skin: Negative for rash.  Neurological: Negative for dizziness, weakness, light-headedness and numbness.  All other systems reviewed and are  negative.  Per HPI unless specifically indicated above     Objective:    BP (!) 142/74   Pulse 72   Temp 97 F (36.1 C) (Oral)   Ht 5' 7"  (1.702 m)   Wt 200 lb (90.7 kg)   BMI 31.32 kg/m   Wt Readings from Last 3 Encounters:  11/20/16 200 lb (90.7 kg)  05/22/16 203 lb (92.1 kg)  11/16/15 203 lb 12.8 oz (92.4 kg)    Physical Exam  Constitutional: He is oriented to person, place, and time. He appears well-developed and well-nourished. No distress.  Eyes: Conjunctivae are normal. No scleral icterus.  Neck: Neck supple. No thyromegaly present.  Cardiovascular: Normal rate, regular rhythm, normal heart sounds and intact distal pulses.   No murmur heard. Pulmonary/Chest: Effort normal and breath sounds normal. No respiratory distress. He has no wheezes.  Musculoskeletal: Normal range of motion. He exhibits no edema.  Lymphadenopathy:    He has no cervical adenopathy.  Neurological: He is alert and oriented to person, place, and time. Coordination normal.  Skin: Skin is warm and dry. No rash noted. He is not diaphoretic.  Psychiatric: He has a normal mood and affect. His behavior is normal.  Nursing note and vitals reviewed.      Assessment & Plan:   Problem List Items Addressed This Visit      Cardiovascular and Mediastinum   Essential hypertension, benign - Primary   Relevant Medications   losartan (COZAAR) 50 MG tablet   atorvastatin (LIPITOR) 40 MG tablet  amLODipine (NORVASC) 5 MG tablet   Other Relevant Orders   CMP14+EGFR     Digestive   GERD (gastroesophageal reflux disease)   Relevant Medications   pantoprazole (PROTONIX) 40 MG tablet   Other Relevant Orders   CBC with Differential/Platelet     Other   Hyperlipidemia   Relevant Medications   losartan (COZAAR) 50 MG tablet   atorvastatin (LIPITOR) 40 MG tablet   amLODipine (NORVASC) 5 MG tablet   Other Relevant Orders   Lipid panel   Overweight   Relevant Orders   Lipid panel       Follow up  plan: Return in about 6 months (around 05/22/2017), or if symptoms worsen or fail to improve, for Hypertension and cholesterol recheck.  Counseling provided for all of the vaccine components Orders Placed This Encounter  Procedures  . CMP14+EGFR  . Lipid panel  . CBC with Differential/Platelet    Caryl Pina, MD [facility] 11/20/2016, 9:24 AM

## 2016-11-20 NOTE — Addendum Note (Signed)
Addended by: Michaela Corner on: 11/20/2016 09:43 AM   Modules accepted: Orders

## 2016-11-21 LAB — CBC WITH DIFFERENTIAL/PLATELET
BASOS ABS: 0 10*3/uL (ref 0.0–0.2)
Basos: 1 %
EOS (ABSOLUTE): 0.2 10*3/uL (ref 0.0–0.4)
Eos: 3 %
HEMATOCRIT: 43.4 % (ref 37.5–51.0)
Hemoglobin: 13.8 g/dL (ref 13.0–17.7)
IMMATURE GRANULOCYTES: 0 %
Immature Grans (Abs): 0 10*3/uL (ref 0.0–0.1)
LYMPHS ABS: 2.9 10*3/uL (ref 0.7–3.1)
Lymphs: 42 %
MCH: 30.5 pg (ref 26.6–33.0)
MCHC: 31.8 g/dL (ref 31.5–35.7)
MCV: 96 fL (ref 79–97)
MONOS ABS: 0.7 10*3/uL (ref 0.1–0.9)
Monocytes: 10 %
Neutrophils Absolute: 3.1 10*3/uL (ref 1.4–7.0)
Neutrophils: 44 %
PLATELETS: 297 10*3/uL (ref 150–379)
RBC: 4.52 x10E6/uL (ref 4.14–5.80)
RDW: 14.1 % (ref 12.3–15.4)
WBC: 6.8 10*3/uL (ref 3.4–10.8)

## 2016-11-21 LAB — CMP14+EGFR
ALT: 18 IU/L (ref 0–44)
AST: 24 IU/L (ref 0–40)
Albumin/Globulin Ratio: 1.6 (ref 1.2–2.2)
Albumin: 4.4 g/dL (ref 3.5–4.8)
Alkaline Phosphatase: 117 IU/L (ref 39–117)
BUN/Creatinine Ratio: 24 (ref 10–24)
BUN: 22 mg/dL (ref 8–27)
Bilirubin Total: 0.6 mg/dL (ref 0.0–1.2)
CALCIUM: 9.5 mg/dL (ref 8.6–10.2)
CO2: 23 mmol/L (ref 18–29)
CREATININE: 0.91 mg/dL (ref 0.76–1.27)
Chloride: 101 mmol/L (ref 96–106)
GFR, EST AFRICAN AMERICAN: 95 mL/min/{1.73_m2} (ref >59)
GFR, EST NON AFRICAN AMERICAN: 82 mL/min/{1.73_m2} (ref >59)
Globulin, Total: 2.8 g/dL (ref 1.5–4.5)
Glucose: 120 mg/dL — ABNORMAL HIGH (ref 65–99)
Potassium: 4.5 mmol/L (ref 3.5–5.2)
Sodium: 140 mmol/L (ref 134–144)
TOTAL PROTEIN: 7.2 g/dL (ref 6.0–8.5)

## 2016-11-21 LAB — LIPID PANEL
CHOL/HDL RATIO: 3 ratio (ref 0.0–5.0)
Cholesterol, Total: 172 mg/dL (ref 100–199)
HDL: 57 mg/dL (ref >39)
LDL Calculated: 96 mg/dL (ref 0–99)
TRIGLYCERIDES: 94 mg/dL (ref 0–149)
VLDL CHOLESTEROL CAL: 19 mg/dL (ref 5–40)

## 2016-11-23 LAB — SPECIMEN STATUS REPORT

## 2016-11-23 LAB — HGB A1C W/O EAG: Hgb A1c MFr Bld: 6.7 % — ABNORMAL HIGH (ref 4.8–5.6)

## 2016-12-10 ENCOUNTER — Encounter: Payer: Self-pay | Admitting: Pharmacist

## 2016-12-10 ENCOUNTER — Ambulatory Visit (INDEPENDENT_AMBULATORY_CARE_PROVIDER_SITE_OTHER): Payer: Medicare Other | Admitting: Pharmacist

## 2016-12-10 DIAGNOSIS — E1169 Type 2 diabetes mellitus with other specified complication: Secondary | ICD-10-CM | POA: Insufficient documentation

## 2016-12-10 DIAGNOSIS — E119 Type 2 diabetes mellitus without complications: Secondary | ICD-10-CM

## 2016-12-10 NOTE — Progress Notes (Signed)
Patient ID: KORT STETTLER, male   DOB: [birth year], 76 y.o.   MRN: [id number]   Subjective:    MAURI TOLEN is a 76 y.o. male who presents per referral by his PCP after initial diagnosis of type 2 DM for disuccion and education. Mr. Shiner had FBG that was 120 and A1c of 6.7% (11/20/2016).   Positive family history of DM - mother, 2 sisters and 4 brothers with DM.  Current monitoring regimen: none  Weight trend: has decreased 1 lbs since notified of elevated BG Prior visit with CDE: no Current diet: in general, an "unhealthy" diet  Eating cinnamon toast crunch cereal, toast with jelly or eggs + bacon Nabs or banana sandwich for lunch Meat, potatoes, bread and other vegetables for supper  Sometimes sweets or snack  He was drinking soda daily but has switched to water or 1 soda over 2-3 days. Current exercise: none Medication Compliance?  Yes   Is He on ACE inhibitor or angiotensin II receptor blocker?  Yes    losartan (Cozaar)  The following portions of the patient's history were reviewed and updated as appropriate: allergies, current medications, past family history, past medical history, past social history, past surgical history and problem list.    Objective:    BP 124/72   Pulse 76   Ht 5\' 7"  (1.702 m)   Wt 199 lb (90.3 kg)   BMI 31.17 kg/m   Lab Review Glucose  Date Value  11/20/2016 120 mg/dL (H)  11/16/2015 122 mg/dL (H)  11/08/2014 120 mg/dL (H)  04/06/2013 116 mg/dl  10/16/2012 105 mg/dl (H)  04/17/2012 107 mg/dl (H)   Glucose, Bld (mg/dL)  Date Value  06/29/2014 130 (H)  07/27/2009 129 (H)  05/31/2009 113 (H)   Chloride (mEq/L)  Date Value  04/06/2013 106   CO2  Date Value  11/20/2016 23 mmol/L  11/16/2015 24 mmol/L  11/08/2014 25 mmol/L  04/06/2013 24 mEq/L  10/16/2012 26 mEq/L  04/17/2012 24 mEq/L   BUN (mg/dL)  Date Value  11/20/2016 22  11/16/2015 14  11/08/2014 15  04/06/2013 16.7  10/16/2012 18.0  04/17/2012 15.0    Creatinine (mg/dL)  Date Value  04/06/2013 1.0  10/16/2012 1.0  04/17/2012 1.0   Creatinine, Ser (mg/dL)  Date Value  11/20/2016 0.91  11/16/2015 1.03  11/08/2014 1.00      Assessment:     Type 2 DM - new diagnosis  Plan:    1.  Rx changes: none 2.  Education: Reviewed 'ABCs' of diabetes management (respective goals in parentheses):  A1C (<7), blood pressure (<130/80), and cholesterol (LDL <100). 3. Discussed pathophysiology of DM; difference between type 1 and type 2 DM. 4. CHO counting diet discussed.  Reviewed CHO amount in various foods and how to read nutrition labels.  Discussed recommended serving sizes.  5.  Recommended increase physical activity start with 10 to 15 minutes per day and increase as able to goal of 150 minutes per week 6.  Goal weight loss is 7 to 10% which is 14 to 20 lbs. 7. Follow up: 2 months

## 2017-02-10 DIAGNOSIS — X32XXXA Exposure to sunlight, initial encounter: Secondary | ICD-10-CM | POA: Diagnosis not present

## 2017-02-10 DIAGNOSIS — D225 Melanocytic nevi of trunk: Secondary | ICD-10-CM | POA: Diagnosis not present

## 2017-02-10 DIAGNOSIS — L570 Actinic keratosis: Secondary | ICD-10-CM | POA: Diagnosis not present

## 2017-02-17 ENCOUNTER — Encounter: Payer: Self-pay | Admitting: Family Medicine

## 2017-02-17 ENCOUNTER — Ambulatory Visit (INDEPENDENT_AMBULATORY_CARE_PROVIDER_SITE_OTHER): Payer: Medicare Other | Admitting: Family Medicine

## 2017-02-17 VITALS — BP 129/71 | HR 67 | Temp 97.1°F | Ht 67.0 in | Wt 189.2 lb

## 2017-02-17 DIAGNOSIS — E782 Mixed hyperlipidemia: Secondary | ICD-10-CM

## 2017-02-17 DIAGNOSIS — E663 Overweight: Secondary | ICD-10-CM | POA: Diagnosis not present

## 2017-02-17 DIAGNOSIS — B351 Tinea unguium: Secondary | ICD-10-CM

## 2017-02-17 DIAGNOSIS — I10 Essential (primary) hypertension: Secondary | ICD-10-CM | POA: Diagnosis not present

## 2017-02-17 DIAGNOSIS — E119 Type 2 diabetes mellitus without complications: Secondary | ICD-10-CM | POA: Diagnosis not present

## 2017-02-17 LAB — BAYER DCA HB A1C WAIVED: HB A1C (BAYER DCA - WAIVED): 6.4 % (ref <7.0)

## 2017-02-17 MED ORDER — TERBINAFINE HCL 250 MG PO TABS: 250.0000 mg | ORAL_TABLET | Freq: Every day | ORAL | 0 refills | Status: DC

## 2017-02-17 NOTE — Progress Notes (Signed)
BP 129/71   Pulse 67   Temp (!) 97.1 F (36.2 C) (Oral)   Ht 5\' 7"  (1.702 m)   Wt 189 lb 4 oz (85.8 kg)   BMI 29.64 kg/m    Subjective:    Patient ID: Paul [name], male    DOB: 1940-12-17, 76 y.o.   MRN: [id number]  HPI: Paul [name] is a 76 y.o. male presenting on 02/17/2017 for Diabetes (3 month followup); Hyperlipidemia; and Hypertension   HPI Type 2 diabetes mellitus Patient comes in today for recheck of his diabetes. Patient has been currently taking diet control, it is a new diagnosis. Patient is currently on an ACE inhibitor/ARB. Patient has not seen an ophthalmologist this year. Patient denies any issues with their feet except toenail thickening. He has been trying to change his diet has lost 10 pounds  Hypertension Patient is currently on losartan and amlodipine, and their blood pressure today is 129/71. Patient denies any lightheadedness or dizziness. Patient denies headaches, blurred vision, chest pains, shortness of breath, or weakness. Denies any side effects from medication and is content with current medication.   Hyperlipidemia Patient is coming in for recheck of his hyperlipidemia. The patient is currently taking Lipitor. They deny any issues with myalgias or history of liver damage from it. They deny any focal numbness or weakness or chest pain.   Thickened toenails Patient has been having issues with thickened and yellowed toenails especially on his 2 big toes on both feet and one of the smaller toes next to it on the left foot. He denies any cracks or sore surrounding or in the toes itself. He denies any pain or drainage associated with it.  Relevant past medical, surgical, family and social history reviewed and updated as indicated. Interim medical history since our last visit reviewed. Allergies and medications reviewed and updated.  Review of Systems  Constitutional: Negative for chills and fever.  Eyes: Negative for discharge.  Respiratory:  Negative for shortness of breath and wheezing.   Cardiovascular: Negative for chest pain and leg swelling.  Musculoskeletal: Negative for back pain and gait problem.  Skin: Negative for rash.  Neurological: Negative for dizziness, weakness, light-headedness, numbness and headaches.  All other systems reviewed and are negative.  Per HPI unless specifically indicated above     Objective:    BP 129/71   Pulse 67   Temp (!) 97.1 F (36.2 C) (Oral)   Ht 5\' 7"  (1.702 m)   Wt 189 lb 4 oz (85.8 kg)   BMI 29.64 kg/m   Wt Readings from Last 3 Encounters:  02/17/17 189 lb 4 oz (85.8 kg)  12/10/16 199 lb (90.3 kg)  11/20/16 200 lb (90.7 kg)    Physical Exam  Constitutional: He is oriented to person, place, and time. He appears well-developed and well-nourished. No distress.  Eyes: Conjunctivae are normal. No scleral icterus.  Neck: Neck supple. No thyromegaly present.  Cardiovascular: Normal rate, regular rhythm, normal heart sounds and intact distal pulses.   No murmur heard. Pulmonary/Chest: Effort normal and breath sounds normal. No respiratory distress. He has no wheezes.  Musculoskeletal: Normal range of motion. He exhibits no edema.  Lymphadenopathy:    He has no cervical adenopathy.  Neurological: He is alert and oriented to person, place, and time. Coordination normal.  Skin: Skin is warm and dry. No rash noted. He is not diaphoretic.  Patient has thickened and yellow toenails on both great toes and his second left toe  Psychiatric: He has a normal mood and affect. His behavior is normal.  Nursing note and vitals reviewed.  Diabetic Foot Exam - Simple   Simple Foot Form Diabetic Foot exam was performed with the following findings:  Yes 02/17/2017 10:05 AM  Visual Inspection No deformities, no ulcerations, no other skin breakdown bilaterally:  Yes Sensation Testing Intact to touch and monofilament testing bilaterally:  Yes Pulse Check Posterior Tibialis and Dorsalis pulse  intact bilaterally:  Yes Comments Patient has neck, as is and thickened toenails on both great toes and his second toe on his left foot         Assessment & Plan:   Problem List Items Addressed This Visit      Cardiovascular and Mediastinum   Essential hypertension, benign - Primary   Relevant Orders   Microalbumin / creatinine urine ratio     Endocrine   Diabetes mellitus type 2, controlled, without complications ([place])   Relevant Orders   Bayer DCA Hb A1c Waived   Microalbumin / creatinine urine ratio   Ambulatory referral to Ophthalmology     Other   Hyperlipidemia   Overweight    Other Visit Diagnoses    Onychomycosis       Relevant Medications   terbinafine (LAMISIL) 250 MG tablet       Follow up plan: Return in about 3 months (around 05/20/2017), or if symptoms worsen or fail to improve, for recheck dm.  Counseling provided for all of the vaccine components Orders Placed This Encounter  Procedures  . Bayer [facility] Hb A1c [place], MD [facility] 02/17/2017, 9:53 AM

## 2017-02-20 LAB — MICROALBUMIN / CREATININE URINE RATIO
CREATININE, UR: 37.3 mg/dL
Microalb/Creat Ratio: <8 mg/g creat (ref 0.0–30.0)
Microalbumin, Urine: <3 ug/mL

## 2017-03-21 DIAGNOSIS — H5203 Hypermetropia, bilateral: Secondary | ICD-10-CM | POA: Diagnosis not present

## 2017-05-05 DIAGNOSIS — Z8546 Personal history of malignant neoplasm of prostate: Secondary | ICD-10-CM | POA: Diagnosis not present

## 2017-05-05 DIAGNOSIS — C61 Malignant neoplasm of prostate: Secondary | ICD-10-CM | POA: Diagnosis not present

## 2017-05-21 ENCOUNTER — Encounter: Payer: Self-pay | Admitting: Family Medicine

## 2017-05-21 ENCOUNTER — Ambulatory Visit (INDEPENDENT_AMBULATORY_CARE_PROVIDER_SITE_OTHER): Payer: Medicare Other | Admitting: Family Medicine

## 2017-05-21 ENCOUNTER — Other Ambulatory Visit: Payer: Self-pay | Admitting: Family Medicine

## 2017-05-21 VITALS — BP 139/81 | HR 67 | Temp 97.8°F | Ht 67.0 in | Wt 184.0 lb

## 2017-05-21 DIAGNOSIS — E119 Type 2 diabetes mellitus without complications: Secondary | ICD-10-CM

## 2017-05-21 DIAGNOSIS — E782 Mixed hyperlipidemia: Secondary | ICD-10-CM | POA: Diagnosis not present

## 2017-05-21 DIAGNOSIS — B351 Tinea unguium: Secondary | ICD-10-CM | POA: Diagnosis not present

## 2017-05-21 DIAGNOSIS — Z23 Encounter for immunization: Secondary | ICD-10-CM | POA: Diagnosis not present

## 2017-05-21 DIAGNOSIS — I10 Essential (primary) hypertension: Secondary | ICD-10-CM | POA: Diagnosis not present

## 2017-05-21 DIAGNOSIS — Z0000 Encounter for general adult medical examination without abnormal findings: Secondary | ICD-10-CM | POA: Diagnosis not present

## 2017-05-21 LAB — BAYER DCA HB A1C WAIVED: HB A1C: 6.1 % (ref <7.0)

## 2017-05-21 MED ORDER — TERBINAFINE HCL 250 MG PO TABS: 250.0000 mg | ORAL_TABLET | Freq: Every day | ORAL | 0 refills | Status: DC

## 2017-05-21 NOTE — Progress Notes (Signed)
BP 139/81   Pulse 67   Temp 97.8 F (36.6 C) (Oral)   Ht _0  (1.702 m)   Wt 184 lb (83.5 kg)   BMI 28.82 kg/m    Subjective:    Patient ID: Paul [name], male    DOB: 08/29/1940, 76 y.o.   MRN: [id number]  HPI: Paul [name] is a 76 y.o. male presenting on 05/21/2017 for Annual Exam and Discuss Lamisil (has 2 pills left of a 3 month supply, should he continue?)   HPI Annual exam and labs Patient is coming in for annual exam and labs today and physical. He is also coming in for a recheck of his chronic medical issues. Patient denies any chest pain, shortness of breath, headaches or vision issues, abdominal complaints, diarrhea, nausea, vomiting, or joint issues.   Type 2 diabetes mellitus Patient comes in today for recheck of his diabetes. Patient has been currently taking diet control and his last A1c was 6.4, we will recheck it today. Patient is currently on an ACE inhibitor/ARB. Patient has seen an ophthalmologist this year. Patient denies any issues with their feet except the toenails.   Hypertension Patient is currently on losartan and amlodipine, and their blood pressure today is 139/81. Patient denies any lightheadedness or dizziness. Patient denies headaches, blurred vision, chest pains, shortness of breath, or weakness. Denies any side effects from medication and is content with current medication.   Hyperlipidemia Patient is coming in for recheck of his hyperlipidemia. The patient is currently taking Lipitor. They deny any issues with myalgias or history of liver damage from it. They deny any focal numbness or weakness or chest pain.   Thickened and yellowed toenails Patient is coming in for recheck of his onychomycosis. It has improved some but he still has about two thirds of the nails are thickened and yellowed. We will continue medication for now.  Relevant past medical, surgical, family and social history reviewed and updated as indicated. Interim medical  history since our last visit reviewed. Allergies and medications reviewed and updated.  Review of Systems  Constitutional: Negative for chills and fever.  HENT: Negative for ear pain and tinnitus.   Eyes: Negative for pain and discharge.  Respiratory: Negative for cough, shortness of breath and wheezing.   Cardiovascular: Negative for chest pain, palpitations and leg swelling.  Gastrointestinal: Negative for abdominal pain, blood in stool, constipation and diarrhea.  Genitourinary: Negative for dysuria and hematuria.  Musculoskeletal: Negative for back pain, gait problem and myalgias.  Skin: Positive for color change (Thickened and yellowed toenails). Negative for rash.  Neurological: Negative for dizziness, weakness and headaches.  Psychiatric/Behavioral: Negative for suicidal ideas.  All other systems reviewed and are negative.   Per HPI unless specifically indicated above        Objective:    BP 139/81   Pulse 67   Temp 97.8 F (36.6 C) (Oral)   Ht _1  (1.702 m)   Wt 184 lb (83.5 kg)   BMI 28.82 kg/m   Wt Readings from Last 3 Encounters:  05/21/17 184 lb (83.5 kg)  02/17/17 189 lb 4 oz (85.8 kg)  12/10/16 199 lb (90.3 kg)    Physical Exam  Constitutional: He is oriented to person, place, and time. He appears well-developed and well-nourished. No distress.  HENT:  Right Ear: External ear normal.  Left Ear: External ear normal.  Nose: Nose normal.  Mouth/Throat: Oropharynx is clear and moist. No oropharyngeal exudate.  Eyes: Pupils are  equal, round, and reactive to light. Conjunctivae and EOM are normal. No scleral icterus.  Neck: Neck supple. No thyromegaly present.  Cardiovascular: Normal rate, regular rhythm, normal heart sounds and intact distal pulses.   No murmur heard. Pulmonary/Chest: Effort normal and breath sounds normal. No respiratory distress. He has no wheezes.  Abdominal: Soft. He exhibits no distension. There is no tenderness. There is no rebound  and no guarding.  Musculoskeletal: Normal range of motion. He exhibits no edema.  Lymphadenopathy:    He has no cervical adenopathy.  Neurological: He is alert and oriented to person, place, and time. Coordination normal.  Skin: Skin is warm and dry. No rash noted. He is not diaphoretic.  Thickened and yellow great toenails on both sides, about one third is clear in about two thirds left are still thick and yellowed  Psychiatric: He has a normal mood and affect. His behavior is normal.  Vitals reviewed.      Assessment & Plan:   Problem List Items Addressed This Visit      Cardiovascular and Mediastinum   Essential hypertension, benign   Relevant Orders   CMP14+EGFR     Endocrine   Diabetes mellitus type 2, controlled, without complications ([place])   Relevant Orders   Bayer DCA Hb A1c Waived   CMP14+EGFR     Other   Hyperlipidemia   Relevant Orders   Lipid panel    Other Visit Diagnoses    Well adult exam    -  Primary   Onychomycosis       Improved but not resolved, will do another 3 months   Relevant Medications   terbinafine (LAMISIL) 250 MG tablet       Follow up plan: Return in about 3 months (around 08/21/2017), or if symptoms worsen or fail to improve, for Recheck diabetes, patient also needs an annual wellness visit scheduled.  Counseling provided for all of the vaccine components Orders Placed This Encounter  Procedures  . Bayer DCA Hb A1c Waived  . CMP14+EGFR  . Lipid panel    Caryl Pina, MD [facility] 05/21/2017, 10:55 AM

## 2017-05-22 LAB — CMP14+EGFR
ALBUMIN: 4.4 g/dL (ref 3.5–4.8)
ALT: 13 IU/L (ref 0–44)
AST: 21 IU/L (ref 0–40)
Albumin/Globulin Ratio: 1.6 (ref 1.2–2.2)
Alkaline Phosphatase: 104 IU/L (ref 39–117)
BUN / CREAT RATIO: 13 (ref 10–24)
BUN: 12 mg/dL (ref 8–27)
Bilirubin Total: 0.6 mg/dL (ref 0.0–1.2)
CO2: 24 mmol/L (ref 20–29)
CREATININE: 0.92 mg/dL (ref 0.76–1.27)
Calcium: 9.8 mg/dL (ref 8.6–10.2)
Chloride: 100 mmol/L (ref 96–106)
GFR calc Af Amer: 94 mL/min/{1.73_m2} (ref >59)
GFR, EST NON AFRICAN AMERICAN: 81 mL/min/{1.73_m2} (ref >59)
GLOBULIN, TOTAL: 2.8 g/dL (ref 1.5–4.5)
GLUCOSE: 109 mg/dL — AB (ref 65–99)
Potassium: 4.6 mmol/L (ref 3.5–5.2)
SODIUM: 143 mmol/L (ref 134–144)
Total Protein: 7.2 g/dL (ref 6.0–8.5)

## 2017-05-22 LAB — LIPID PANEL
CHOL/HDL RATIO: 2.7 ratio (ref 0.0–5.0)
Cholesterol, Total: 160 mg/dL (ref 100–199)
HDL: 60 mg/dL (ref >39)
LDL CALC: 81 mg/dL (ref 0–99)
TRIGLYCERIDES: 97 mg/dL (ref 0–149)
VLDL CHOLESTEROL CAL: 19 mg/dL (ref 5–40)

## 2017-05-26 ENCOUNTER — Encounter: Payer: Self-pay | Admitting: *Deleted

## 2017-05-26 ENCOUNTER — Ambulatory Visit (INDEPENDENT_AMBULATORY_CARE_PROVIDER_SITE_OTHER): Payer: Medicare Other | Admitting: *Deleted

## 2017-05-26 VITALS — BP 123/72 | HR 67 | Ht 66.0 in | Wt 185.0 lb

## 2017-05-26 DIAGNOSIS — Z0000 Encounter for general adult medical examination without abnormal findings: Secondary | ICD-10-CM

## 2017-05-26 NOTE — Progress Notes (Signed)
Subjective:   Paul [name] is a 76 y.o. male who presents for an Initial Medicare Annual Wellness Visit. Mr Health is accompanied by his wife. They have been married for 76 years and have one adult son and three grandchildren.   Review of Systems  Reports that his health is some better than last year.   Cardiac Risk Factors include: advanced age (>34men, >45 women);hypertension;male gender;obesity (BMI >30kg/m2);dyslipidemia  Other systems negative today.    Objective:    Today's Vitals   05/26/17 1537  BP: 123/72  Pulse: 67  Weight: 185 lb (83.9 kg)  Height: 5\' 6"  (1.676 m)   Body mass index is 29.86 kg/m.  Current Medications (verified) Outpatient Encounter Prescriptions as of 05/26/2017  Medication Sig  . amLODipine (NORVASC) 5 MG tablet Take 1 tablet (5 mg total) by mouth daily.  Marland Kitchen aspirin 81 MG tablet Take 81 mg by mouth daily.  Marland Kitchen atorvastatin (LIPITOR) 40 MG tablet Take 1 tablet (40 mg total) by mouth daily.  . Cholecalciferol (VITAMIN D3) 2000 UNITS TABS Take 2,000 Units by mouth daily.  Marland Kitchen losartan (COZAAR) 50 MG tablet TAKE 1 TABLET BY MOUTH ONCE DAILY  . pantoprazole (PROTONIX) 40 MG tablet Take 1 tablet (40 mg total) by mouth daily. (Patient taking differently: Take 40 mg by mouth every other day. )  . terbinafine (LAMISIL) 250 MG tablet Take 1 tablet (250 mg total) by mouth daily.   No facility-administered encounter medications on file as of 05/26/2017.     Allergies (verified) Sulfonamide derivatives   History: Past Medical History:  Diagnosis Date  . 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 Diabetes mellitus without complication ([place])   . Full dentures    does not wear bottoms  . Hyperlipidemia   . Hypertension   . Iron deficiency anemia    presumped malabsorption; neg EGD/ capsule endos/colosopcy  . Iron deficiency anemia,  unspecified 10/16/2012  . Kidney stones    Past Surgical History:  Procedure Laterality Date  . COLONOSCOPY    . HEMORRHOID SURGERY N/A 07/05/2014   Procedure: HEMORRHOIDECTOMY;  Surgeon: Erroll Luna, MD;  Location: [facility];  Service: General;  Laterality: N/A;  . ORIF RADIAL FRACTURE     right-as child  . robtic assisted laparoscopic     radical prostatectomy and bilateral pelvic lymphadenectomy  . UPPER GI ENDOSCOPY     Family History  Problem Relation Age of Onset  . Heart failure Mother   . Diabetes type II Mother   . Diabetes Mother   . Heart attack Father   . Diabetes Brother   . Diabetes Sister   . Cancer Sister        colon  . Diabetes Sister   . Diabetes Brother   . Cancer Brother        bone and leukemia  . Diabetes Brother   . Diabetes Brother   . Cancer Brother    Social History   Occupational History  . Not on file.   Social History Main Topics  . Smoking status: Former Smoker    Quit date: 09/18/1981  . Smokeless tobacco: Never Used  . Alcohol use No  . Drug use: No  . Sexual activity: Not on file   Tobacco Counseling No tobacco use  Activities of Daily Living In your present state of health, do you have any difficulty  performing the following activities: 05/26/2017  Hearing? N  Vision? N  Difficulty concentrating or making decisions? N  Walking or climbing stairs? Y  Comment Gets a little more tired walking up them than he used to  Dressing or bathing? N  Doing errands, shopping? N  Preparing Food and eating ? N  Using the Toilet? N  In the past six months, have you accidently leaked urine? N  Do you have problems with loss of bowel control? N  Managing your Medications? N  Comment Pillbox  Managing your Finances? N  Housekeeping or managing your Housekeeping? N  Some recent data might be hidden    Immunizations and Health Maintenance Immunization History  Administered Date(s) Administered  . Influenza, High Dose  Seasonal PF 05/22/2016, 05/21/2017  . Influenza,inj,Quad PF,6+ Mos 05/10/2013, 05/05/2014, 05/11/2015  . Pneumococcal Conjugate-13 07/23/2013  . Pneumococcal Polysaccharide-23 11/20/2016  . Tdap 11/23/2013   Health Maintenance Due  Topic Date Due  . OPHTHALMOLOGY EXAM  06/28/1951    Patient Care Team: Dettinger, Fransisca Kaufmann, MD as PCP - General (Family Medicine) Elease Etienne as Physician Assistant (Physician Assistant) Chipper Herb, MD as Attending Physician (Family Medicine) Myrlene Broker, MD as Attending Physician (Urology)  No hospitalizations, ER visits, or surgeries this past year.     Assessment:   This is a routine wellness examination for Paul [name].   Hearing/Vision screen No deficits noted during visit. Eye exam is up to date.   Dietary issues and exercise activities discussed: Current Exercise Habits: The patient does not participate in regular exercise at present (Stays active around house, yard and garage. He works on cars and builds things. ), Exercise limited by: None identified  Diet: 3 meals a day and some snacks  Goals    . Exercise 150 minutes per week (moderate activity)    . Weight (lb) < 190 lb (86.2 kg)      Depression Screen PHQ 2/9 Scores 05/26/2017 05/21/2017 02/17/2017 11/20/2016  PHQ - 2 Score 0 0 0 0    Fall Risk Fall Risk  05/26/2017 05/21/2017 02/17/2017 11/20/2016 05/22/2016  Falls in the past year? No No No No No    Cognitive Function: MMSE - Mini Mental State Exam 05/26/2017  Orientation to time 4  Orientation to Place 5  Registration 3  Attention/ Calculation 5  Recall 2  Language- name 2 objects 2  Language- repeat 1  Language- follow 3 step command 3  Language- read & follow direction 1  Write a sentence 1  Copy design 1  Total score 28        Screening Tests Health Maintenance  Topic Date Due  . OPHTHALMOLOGY EXAM  06/28/1951  . HEMOGLOBIN A1C  11/19/2017  . FOOT EXAM  02/17/2018  . COLONOSCOPY   06/28/2019  . TETANUS/TDAP  11/24/2023  . INFLUENZA VACCINE  Completed  . PNA vac Low Risk Adult  Completed  Colonoscopy 2015 at [facility]. Will request report. Eye exam with Venetia Night, OD in [place]. Will request report.       Plan:  Exercise for at least 150 min a week.  Keep f/u with PCP Continue to work on weight loss  I have personally reviewed and noted the following in the patient's chart:   . Medical and social history . Use of alcohol, tobacco or illicit drugs  . Current medications and supplements . Functional ability and status . Nutritional status . Physical activity . Advanced directives . List of  other physicians . Hospitalizations, surgeries, and ER visits in previous 12 months . Vitals . Screenings to include cognitive, depression, and falls . Referrals and appointments  In addition, I have reviewed and discussed with patient certain preventive protocols, quality metrics, and best practice recommendations. A written personalized care plan for preventive services as well as general preventive health recommendations were provided to patient.     Chong Sicilian, RN   05/26/2017

## 2017-05-26 NOTE — Patient Instructions (Signed)
  Paul [name] ,  Thank you for taking time to come for your Medicare Wellness Visit. I appreciate your ongoing commitment to your health goals. Please review the following plan we discussed and let me know if I can assist you in the future.   These are the goals we discussed: Goals    . Exercise 150 minutes per week (moderate activity)    . Weight (lb) < 190 lb (86.2 kg)       This is a list of the screening recommended for you and due dates:  Health Maintenance  Topic Date Due  . Eye exam for diabetics  06/28/1951  . Hemoglobin A1C  11/19/2017  . Complete foot exam   02/17/2018  . Colon Cancer Screening  06/28/2019  . Tetanus Vaccine  11/24/2023  . Flu Shot  Completed  . Pneumonia vaccines  Completed

## 2017-06-16 ENCOUNTER — Encounter: Payer: Self-pay | Admitting: *Deleted

## 2017-08-22 ENCOUNTER — Encounter: Payer: Self-pay | Admitting: Family Medicine

## 2017-08-22 ENCOUNTER — Ambulatory Visit: Payer: Medicare Other | Admitting: Family Medicine

## 2017-08-22 VITALS — BP 132/69 | HR 62 | Temp 97.0°F | Ht 66.0 in | Wt 187.0 lb

## 2017-08-22 DIAGNOSIS — K219 Gastro-esophageal reflux disease without esophagitis: Secondary | ICD-10-CM | POA: Diagnosis not present

## 2017-08-22 DIAGNOSIS — E785 Hyperlipidemia, unspecified: Secondary | ICD-10-CM

## 2017-08-22 DIAGNOSIS — E1169 Type 2 diabetes mellitus with other specified complication: Secondary | ICD-10-CM | POA: Diagnosis not present

## 2017-08-22 DIAGNOSIS — E119 Type 2 diabetes mellitus without complications: Secondary | ICD-10-CM

## 2017-08-22 DIAGNOSIS — E1159 Type 2 diabetes mellitus with other circulatory complications: Secondary | ICD-10-CM

## 2017-08-22 DIAGNOSIS — B351 Tinea unguium: Secondary | ICD-10-CM | POA: Diagnosis not present

## 2017-08-22 DIAGNOSIS — I10 Essential (primary) hypertension: Secondary | ICD-10-CM

## 2017-08-22 DIAGNOSIS — I152 Hypertension secondary to endocrine disorders: Secondary | ICD-10-CM

## 2017-08-22 LAB — BAYER DCA HB A1C WAIVED: HB A1C (BAYER DCA - WAIVED): 5.9 % (ref <7.0)

## 2017-08-22 MED ORDER — LOSARTAN POTASSIUM 50 MG PO TABS: 50.0000 mg | ORAL_TABLET | Freq: Every day | ORAL | 1 refills | Status: DC

## 2017-08-22 MED ORDER — AMLODIPINE BESYLATE 5 MG PO TABS: 5.0000 mg | ORAL_TABLET | Freq: Every day | ORAL | 1 refills | Status: DC

## 2017-08-22 MED ORDER — TERBINAFINE HCL 250 MG PO TABS: 250.0000 mg | ORAL_TABLET | Freq: Every day | ORAL | 0 refills | Status: DC

## 2017-08-22 MED ORDER — ATORVASTATIN CALCIUM 40 MG PO TABS: 40.0000 mg | ORAL_TABLET | Freq: Every day | ORAL | 1 refills | Status: DC

## 2017-08-22 NOTE — Progress Notes (Signed)
BP 132/69   Pulse 62   Temp (!) 97 F (36.1 C) (Oral)   Ht 5' 6"  (1.676 m)   Wt 187 lb (84.8 kg)   BMI 30.18 kg/m    Subjective:    Patient ID: Paul [name], male    DOB: Mar 23, 1941, 77 y.o.   MRN: [id number]  HPI: Paul [name] is a 77 y.o. male presenting on 08/22/2017 for Diabetes (3 mo follow up; patient is fasting) and Hyperlipidemia   HPI Type 2 diabetes mellitus Patient comes in today for recheck of his diabetes. Patient has been currently taking diet control and has been monitored well so far. Patient is currently on an ACE inhibitor/ARB. Patient has not seen an ophthalmologist this year. Patient denies any issues with their feet except onychomycosis.  Patient is still taking the medication for his toenail fungus which is improving but is still present, he wants a refill on this to continue.  Hypertension Patient is currently on amlodipine and losartan, and their blood pressure today is 132/69. Patient denies any lightheadedness or dizziness. Patient denies headaches, blurred vision, chest pains, shortness of breath, or weakness. Denies any side effects from medication and is content with current medication.   Hyperlipidemia Patient is coming in for recheck of his hyperlipidemia. The patient is currently taking Lipitor. They deny any issues with myalgias or history of liver damage from it. They deny any focal numbness or weakness or chest pain.   GERD Patient is currently on pantoprazole, he is taking every other day.  he denies any major symptoms or abdominal pain or belching or burping. She denies any blood in her stool or lightheadedness or dizziness.   Relevant past medical, surgical, family and social history reviewed and updated as indicated. Interim medical history since our last visit reviewed. Allergies and medications reviewed and updated.  Review of Systems  Constitutional: Negative for chills and fever.  Respiratory: Negative for shortness of breath and  wheezing.   Cardiovascular: Negative for chest pain and leg swelling.  Gastrointestinal: Negative for abdominal pain.  Musculoskeletal: Negative for back pain and gait problem.  Skin: Negative for rash.  Neurological: Negative for dizziness, weakness, light-headedness and numbness.  All other systems reviewed and are negative.   Per HPI unless specifically indicated above   Allergies as of 08/22/2017      Reactions   Sulfonamide Derivatives    REACTION: rash      Medication List        Accurate as of 08/22/17 11:14 AM. Always use your most recent med list.          amLODipine 5 MG tablet Commonly known as:  NORVASC Take 1 tablet (5 mg total) by mouth daily.   aspirin 81 MG tablet Take 81 mg by mouth daily.   atorvastatin 40 MG tablet Commonly known as:  LIPITOR Take 1 tablet (40 mg total) by mouth daily.   losartan 50 MG tablet Commonly known as:  COZAAR Take 1 tablet (50 mg total) by mouth daily.   pantoprazole 40 MG tablet Commonly known as:  PROTONIX Take 1 tablet (40 mg total) by mouth daily.   terbinafine 250 MG tablet Commonly known as:  LAMISIL Take 1 tablet (250 mg total) by mouth daily.   Vitamin D3 2000 units Tabs Take 2,000 Units by mouth daily.          Objective:    BP 132/69   Pulse 62   Temp (!) 97 F (36.1 C) (  Oral)   Ht 5' 6"  (1.676 m)   Wt 187 lb (84.8 kg)   BMI 30.18 kg/m   Wt Readings from Last 3 Encounters:  08/22/17 187 lb (84.8 kg)  05/26/17 185 lb (83.9 kg)  05/21/17 184 lb (83.5 kg)    Physical Exam  Constitutional: He is oriented to person, place, and time. He appears well-developed and well-nourished. No distress.  Eyes: Conjunctivae are normal. No scleral icterus.  Neck: Neck supple. No thyromegaly present.  Cardiovascular: Normal rate, regular rhythm, normal heart sounds and intact distal pulses.  No murmur heard. Pulmonary/Chest: Effort normal and breath sounds normal. No respiratory distress. He has no wheezes.  He has no rales.  Musculoskeletal: Normal range of motion. He exhibits no edema.  Lymphadenopathy:    He has no cervical adenopathy.  Neurological: He is alert and oriented to person, place, and time. Coordination normal.  Skin: Skin is warm and dry. No rash noted. He is not diaphoretic.  Thickened and yellowed toenails, improving and can see a pink base starting to grow out.  Psychiatric: He has a normal mood and affect. His behavior is normal.  Nursing note and vitals reviewed.   Diabetic Foot Exam - Simple   Simple Foot Form Diabetic Foot exam was performed with the following findings:  Yes 08/22/2017  9:45 AM  Visual Inspection No deformities, no ulcerations, no other skin breakdown bilaterally:  Yes Sensation Testing Intact to touch and monofilament testing bilaterally:  Yes Pulse Check Posterior Tibialis and Dorsalis pulse intact bilaterally:  Yes Comments        Assessment & Plan:   Problem List Items Addressed This Visit      Cardiovascular and Mediastinum   Hypertension associated with diabetes ([facility])   Relevant Medications   losartan (COZAAR) 50 MG tablet   atorvastatin (LIPITOR) 40 MG tablet   amLODipine (NORVASC) 5 MG tablet   Other Relevant Orders   CMP14+EGFR     Digestive   GERD (gastroesophageal reflux disease)     Endocrine   Hyperlipidemia associated with type 2 diabetes mellitus ([facility])   Relevant Medications   losartan (COZAAR) 50 MG tablet   atorvastatin (LIPITOR) 40 MG tablet   amLODipine (NORVASC) 5 MG tablet   Other Relevant Orders   Lipid panel   Diabetes mellitus type 2, controlled, without complications ([facility]) - Primary   Relevant Medications   losartan (COZAAR) 50 MG tablet   atorvastatin (LIPITOR) 40 MG tablet   Other Relevant Orders   CMP14+EGFR   Bayer DCA Hb A1c Waived    Other Visit Diagnoses    Onychomycosis       Relevant Medications   terbinafine (LAMISIL) 250 MG tablet       Follow up plan: Return in about 3 months  (around 11/20/2017), or if symptoms worsen or fail to improve, for Recheck diabetes.  Counseling provided for all of the vaccine components Orders Placed This Encounter  Procedures  . CMP14+EGFR  . Lipid panel  . Bayer [facility] Hb A1c [place], MD [facility] 08/22/2017, 11:14 AM

## 2017-08-23 LAB — CMP14+EGFR
A/G RATIO: 1.7 (ref 1.2–2.2)
ALT: 13 IU/L (ref 0–44)
AST: 20 IU/L (ref 0–40)
Albumin: 4.3 g/dL (ref 3.5–4.8)
Alkaline Phosphatase: 95 IU/L (ref 39–117)
BILIRUBIN TOTAL: 0.4 mg/dL (ref 0.0–1.2)
BUN/Creatinine Ratio: 15 (ref 10–24)
BUN: 15 mg/dL (ref 8–27)
CALCIUM: 9.4 mg/dL (ref 8.6–10.2)
CHLORIDE: 101 mmol/L (ref 96–106)
CO2: 22 mmol/L (ref 20–29)
Creatinine, Ser: 0.98 mg/dL (ref 0.76–1.27)
GFR calc Af Amer: 86 mL/min/{1.73_m2} (ref >59)
GFR calc non Af Amer: 75 mL/min/{1.73_m2} (ref >59)
GLUCOSE: 107 mg/dL — AB (ref 65–99)
Globulin, Total: 2.6 g/dL (ref 1.5–4.5)
POTASSIUM: 4.4 mmol/L (ref 3.5–5.2)
Sodium: 140 mmol/L (ref 134–144)
TOTAL PROTEIN: 6.9 g/dL (ref 6.0–8.5)

## 2017-08-23 LAB — LIPID PANEL
Chol/HDL Ratio: 2.3 ratio (ref 0.0–5.0)
Cholesterol, Total: 142 mg/dL (ref 100–199)
HDL: 63 mg/dL (ref >39)
LDL Calculated: 66 mg/dL (ref 0–99)
TRIGLYCERIDES: 66 mg/dL (ref 0–149)
VLDL CHOLESTEROL CAL: 13 mg/dL (ref 5–40)

## 2017-09-04 ENCOUNTER — Telehealth: Payer: Self-pay | Admitting: *Deleted

## 2017-09-04 MED ORDER — OSELTAMIVIR PHOSPHATE 75 MG PO CAPS: 75.0000 mg | ORAL_CAPSULE | Freq: Two times a day (BID) | ORAL | 0 refills | Status: DC

## 2017-09-04 NOTE — Telephone Encounter (Signed)
I sent in tamiflu, if patient has no symptoms take once daily for 10 days.  If patient has symptoms then have them take it twice a day for 5 days.

## 2017-09-04 NOTE — Telephone Encounter (Signed)
pt aware 

## 2017-10-17 ENCOUNTER — Telehealth: Payer: Self-pay | Admitting: Family Medicine

## 2017-10-17 NOTE — Telephone Encounter (Signed)
lmtcb

## 2017-10-17 NOTE — Telephone Encounter (Signed)
Was his specific losartan recalled or is he just asking about the overall recall, if he has was recalled then we can send in a new medication, if he is just asking about the overall recall that he needs to call his pharmacy to see if his specific one was recalled

## 2017-10-21 MED ORDER — LOSARTAN POTASSIUM 100 MG PO TABS: 50.0000 mg | ORAL_TABLET | Freq: Every day | ORAL | 3 refills | Status: DC

## 2017-10-21 NOTE — Telephone Encounter (Signed)
His specific Losartan was recalled.  He is on 50 mg.  Pharmacy can substitute 100 mg, 1/2 tablet daily, which is not on recall.  Please advise.

## 2017-10-21 NOTE — Telephone Encounter (Signed)
rc for nurse 

## 2017-10-21 NOTE — Telephone Encounter (Signed)
Prescription sent to pharmacy.

## 2017-12-02 ENCOUNTER — Ambulatory Visit: Payer: Medicare Other | Admitting: Family Medicine

## 2017-12-02 ENCOUNTER — Encounter: Payer: Self-pay | Admitting: Family Medicine

## 2017-12-02 ENCOUNTER — Telehealth: Payer: Self-pay

## 2017-12-02 VITALS — BP 139/77 | HR 76 | Temp 97.0°F | Ht 66.0 in | Wt 187.0 lb

## 2017-12-02 DIAGNOSIS — E119 Type 2 diabetes mellitus without complications: Secondary | ICD-10-CM

## 2017-12-02 DIAGNOSIS — B351 Tinea unguium: Secondary | ICD-10-CM

## 2017-12-02 DIAGNOSIS — Z9189 Other specified personal risk factors, not elsewhere classified: Secondary | ICD-10-CM

## 2017-12-02 DIAGNOSIS — R251 Tremor, unspecified: Secondary | ICD-10-CM | POA: Diagnosis not present

## 2017-12-02 DIAGNOSIS — E785 Hyperlipidemia, unspecified: Secondary | ICD-10-CM

## 2017-12-02 DIAGNOSIS — I152 Hypertension secondary to endocrine disorders: Secondary | ICD-10-CM

## 2017-12-02 DIAGNOSIS — K219 Gastro-esophageal reflux disease without esophagitis: Secondary | ICD-10-CM | POA: Diagnosis not present

## 2017-12-02 DIAGNOSIS — E1169 Type 2 diabetes mellitus with other specified complication: Secondary | ICD-10-CM

## 2017-12-02 DIAGNOSIS — E1159 Type 2 diabetes mellitus with other circulatory complications: Secondary | ICD-10-CM

## 2017-12-02 DIAGNOSIS — I10 Essential (primary) hypertension: Secondary | ICD-10-CM

## 2017-12-02 DIAGNOSIS — C61 Malignant neoplasm of prostate: Secondary | ICD-10-CM

## 2017-12-02 LAB — BAYER DCA HB A1C WAIVED: HB A1C (BAYER DCA - WAIVED): 6 % (ref <7.0)

## 2017-12-02 MED ORDER — BLOOD GLUCOSE MONITOR SYSTEM W/DEVICE KIT: PACK | 11 refills | Status: DC

## 2017-12-02 MED ORDER — ATORVASTATIN CALCIUM 40 MG PO TABS: 40.0000 mg | ORAL_TABLET | Freq: Every day | ORAL | 1 refills | Status: DC

## 2017-12-02 MED ORDER — TELMISARTAN 20 MG PO TABS: 20.0000 mg | ORAL_TABLET | Freq: Every day | ORAL | 1 refills | Status: DC

## 2017-12-02 MED ORDER — AMLODIPINE BESYLATE 5 MG PO TABS: 5.0000 mg | ORAL_TABLET | Freq: Every day | ORAL | 1 refills | Status: DC

## 2017-12-02 MED ORDER — TERBINAFINE HCL 250 MG PO TABS: 250.0000 mg | ORAL_TABLET | Freq: Every day | ORAL | 0 refills | Status: DC

## 2017-12-02 NOTE — Progress Notes (Signed)
Paul [name] was seen today in the movement disorders clinic for neurologic consultation at the request of Dettinger, Fransisca Kaufmann, MD.  The consultation is for the evaluation of tremor.  The records that were made available to me were reviewed.   This patient is accompanied in the office by his spouse who supplements the history.    Specific Symptoms:  Tremor: Yes.   RUE - wife states going on x 1 year but pt thinks about 6 months;  Noting it when driving or when hand in pocket.  He can move it and it will stop.  Drinks 2 cups of coffee in AM and 1 cup after dinner and no changes in tremor.  Doesn't drink EtOH Family hx of similar:  No. Voice: no change per wife Sleep: sleeps well  Vivid Dreams:  No.  Acting out dreams:  No. Wet Pillows: No. Postural symptoms:  No.  Falls?  No. Bradykinesia symptoms: slow movements and difficulty getting out of a chair Loss of smell:  No. Loss of taste:  No. Urinary Incontinence:  Yes, since prostate sx Difficulty Swallowing:  No. Handwriting, micrographia: No., is R hand dominant Trouble with ADL's:  No.  Trouble buttoning clothing: No. Depression:  No. Memory changes:  Yes.  , some minor.  Takes own medications.  Wife does bills Hallucinations:  No.  visual distortions: No. N/V:  No. Lightheaded:  No.  Syncope: No. Diplopia:  No. Dyskinesia:  No.  Neuroimaging of the brain has not previously been performed.   PREVIOUS MEDICATIONS: none to date  ALLERGIES:   Allergies  Allergen Reactions  . Sulfonamide Derivatives     REACTION: rash    CURRENT MEDICATIONS:  Outpatient Encounter Medications as of 12/03/2017  Medication Sig  . amLODipine (NORVASC) 5 MG tablet Take 1 tablet (5 mg total) by mouth daily.  Marland Kitchen aspirin 81 MG tablet Take 81 mg by mouth daily.  Marland Kitchen atorvastatin (LIPITOR) 40 MG tablet Take 1 tablet (40 mg total) by mouth daily.  . Cholecalciferol (VITAMIN D3) 2000 UNITS TABS Take 2,000 Units by mouth daily.  . irbesartan  (AVAPRO) 75 MG tablet Take 1 tablet (75 mg total) by mouth daily.  Marland Kitchen losartan (COZAAR) 50 MG tablet Take 50 mg by mouth daily.  . pantoprazole (PROTONIX) 40 MG tablet Take 1 tablet (40 mg total) by mouth daily. (Patient taking differently: Take 40 mg by mouth every other day. )  . terbinafine (LAMISIL) 250 MG tablet Take 1 tablet (250 mg total) by mouth daily.  . Blood Glucose Monitoring Suppl (BLOOD GLUCOSE MONITOR SYSTEM) w/Device KIT Check blood sugar three times a day Dx:  E11.9 (Patient not taking: Reported on 12/03/2017)  . carbidopa-levodopa (SINEMET IR) 25-100 MG tablet Take 1 tablet by mouth 3 (three) times daily.  Marland Kitchen telmisartan (MICARDIS) 20 MG tablet Take 20 mg by mouth daily.  . [DISCONTINUED] telmisartan (MICARDIS) 20 MG tablet Take 1 tablet (20 mg total) by mouth daily.   No facility-administered encounter medications on file as of 12/03/2017.     PAST MEDICAL HISTORY:   Past Medical History:  Diagnosis Date  . 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 Diabetes mellitus without complication ([place])   . Full dentures    does not wear bottoms  . GERD (gastroesophageal reflux disease)   . Hyperlipidemia   . Hypertension   . Iron deficiency anemia  presumped malabsorption; neg EGD/ capsule endos/colosopcy  . Iron deficiency anemia, unspecified 10/16/2012  . Kidney stones     PAST SURGICAL HISTORY:   Past Surgical History:  Procedure Laterality Date  . COLONOSCOPY    . HEMORRHOID SURGERY N/A 07/05/2014   Procedure: HEMORRHOIDECTOMY;  Surgeon: Erroll Luna, MD;  Location: [facility];  Service: General;  Laterality: N/A;  . ORIF RADIAL FRACTURE     right-as child  . robtic assisted laparoscopic     radical prostatectomy and bilateral pelvic lymphadenectomy  . UPPER GI ENDOSCOPY      SOCIAL HISTORY:   Social History   Socioeconomic History  .  Marital status: Married    Spouse name: Not on file  . Number of children: Not on file  . Years of education: Not on file  . Highest education level: Not on file  Occupational History  . Occupation: retired    Comment: Architect work  Scientific laboratory technician  . Financial resource strain: Not on file  . Food insecurity:    Worry: Not on file    Inability: Not on file  . Transportation needs:    Medical: Not on file    Non-medical: Not on file  Tobacco Use  . Smoking status: Former Smoker    Last attempt to quit: 09/18/1981    Years since quitting: 36.2  . Smokeless tobacco: Never Used  Substance and Sexual Activity  . Alcohol use: No  . Drug use: No  . Sexual activity: Not on file  Lifestyle  . Physical activity:    Days per week: Not on file    Minutes per session: Not on file  . Stress: Not on file  Relationships  . Social connections:    Talks on phone: Not on file    Gets together: Not on file    Attends religious service: Not on file    Active member of club or organization: Not on file    Attends meetings of clubs or organizations: Not on file    Relationship status: Not on file  . Intimate partner violence:    Fear of current or ex partner: Not on file    Emotionally abused: Not on file    Physically abused: Not on file    Forced sexual activity: Not on file  Other Topics Concern  . Not on file  Social History Narrative  . Not on file    FAMILY HISTORY:   Family Status  Relation Name Status  . Mother  Deceased at age 31  . Father  Deceased at age 37       heart attack  . Brother  Deceased       age 48 stents  . Sister  Deceased  . Sister  Alive  . Sister  Alive  . Brother  Deceased  . Brother  Alive  . Brother  Alive  . Son  Alive    ROS:  A complete 10 system review of systems was obtained and was unremarkable apart from what is mentioned above.  PHYSICAL EXAMINATION:    VITALS:   Vitals:   12/03/17 0839  BP: 138/64  Pulse: 64  SpO2: 92%    Weight: 188 lb (85.3 kg)  Height: 5' 6"  (1.676 m)    GEN:  The patient appears stated age and is in NAD. HEENT:  Normocephalic, atraumatic.  The mucous membranes are moist. The superficial temporal arteries are without ropiness or tenderness. CV:  RRR Lungs:  CTAB  Neck/HEME:  There are no carotid bruits bilaterally.  Neurological examination:  Orientation: The patient is alert and oriented x3. Fund of knowledge is appropriate.  Recent and remote memory are intact.  Attention and concentration are normal.    Able to name objects and repeat phrases. Cranial nerves: There is good facial symmetry.  There is facial hypomimia.  Pupils are equal round and reactive to light bilaterally. Fundoscopic exam reveals clear margins bilaterally. Extraocular muscles are intact. The visual fields are full to confrontational testing. The speech is fluent and clear.  He is very hypophonic.  Soft palate rises symmetrically and there is no tongue deviation. Hearing is intact to conversational tone. Sensation: Sensation is intact to light and pinprick throughout (facial, trunk, extremities). Vibration is intact at the bilateral big toe. There is no extinction with double simultaneous stimulation. There is no sensory dermatomal level identified. Motor: Strength is 5/5 in the bilateral upper and lower extremities.   Shoulder shrug is equal and symmetric.  There is no pronator drift. Deep tendon reflexes: Deep tendon reflexes are 2+/4 at the bilateral biceps, triceps, brachioradialis, patella and achilles. Plantar responses are downgoing bilaterally.  Movement examination: Tone: There is mild increased tone in the RUE.  Tone elsewhere is normal Abnormal movements: There is R>LUE resting tremor (L only in thumb).  It increases with distraction Coordination:  There is decremation with RAM's, with finger taps and toe taps on the right.  All other RAMs are normal.   Gait and Station: The patient has no difficulty arising  out of a deep-seated chair without the use of the hands. The patient's stride length is slightly decreased with decreased arm swing.  The patient has a negative pull test.      Labs: Lab Results  Component Value Date   TSH 2.253 09/22/2008     Chemistry      Component Value Date/Time   NA 140 08/22/2017 1048   NA 141 04/06/2013 0956   K 4.4 08/22/2017 1048   K 4.1 04/06/2013 0956   CL 101 08/22/2017 1048   CL 106 10/16/2012 1022   CO2 22 08/22/2017 1048   CO2 24 04/06/2013 0956   BUN 15 08/22/2017 1048   BUN 16.7 04/06/2013 0956   CREATININE 0.98 08/22/2017 1048   CREATININE 1.0 04/06/2013 0956      Component Value Date/Time   CALCIUM 9.4 08/22/2017 1048   CALCIUM 9.7 04/06/2013 0956   ALKPHOS 95 08/22/2017 1048   ALKPHOS 101 04/06/2013 0956   AST 20 08/22/2017 1048   AST 24 04/06/2013 0956   ALT 13 08/22/2017 1048   ALT 23 04/06/2013 0956   BILITOT 0.4 08/22/2017 1048   BILITOT 0.34 04/06/2013 0956       ASSESSMENT/PLAN:  1.  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  The patient has tremor, bradykinesia, rigidity  -We discussed the diagnosis as well as pathophysiology of the disease.  We discussed treatment options as well as prognostic indicators.  Patient education was provided.  -We discussed that it used to be thought that levodopa would increase risk of melanoma but now it is believed that Parkinsons itself likely increases risk of melanoma. he is to get regular skin checks.  -Greater than 50% of the 45 minute visit was spent in counseling answering questions and talking about what to expect now as well as in the future.  We talked about medication options as well as potential future surgical options.  We talked about safety in the home.  -  We decided to add carbidopa/levodopa 25/100.  1/2 tab tid x 1 wk, then 1/2 in am & noon & 1 at night for a week, then 1/2 in am &1 at noon &night for a week, then 1 po tid.  Risks, benefits, side effects and alternative  therapies were discussed.  The opportunity to ask questions was given and they were answered to the best of my ability.  The patient expressed understanding and willingness to follow the outlined treatment protocols.  -We discussed community resources in the area including patient support groups and community exercise programs for PD and pt education was provided to the patient.    -Will meet my PD social worker next visit as she was out of office today.   -TSH today  2.  Follow up is anticipated in the next 3-4 months, sooner should new neurologic issues arise.  Cc:  Dettinger, Fransisca Kaufmann, MD

## 2017-12-02 NOTE — Patient Instructions (Addendum)
Discussed possible cardiac stress testing with cardiology in the future if patient desires.Framingham risk score of 10 to 20% in the next 10 years is meeting for risk for heart attacks or strokes in the next 10 years.

## 2017-12-02 NOTE — Progress Notes (Signed)
BP 139/77   Pulse 76   Temp (!) 97 F (36.1 C) (Oral)   Ht 5' 6"  (1.676 m)   Wt 187 lb (84.8 kg)   BMI 30.18 kg/m    Subjective:    Patient ID: Paul [name], male    DOB: 1940-12-10, 77 y.o.   MRN: [id number]  HPI: Paul [name] is a 77 y.o. male presenting on 12/02/2017 for Diabetes; Hypertension (discuss Losartan, wants to change meds because of all the recalls); and Hyperlipidemia   HPI Type 2 diabetes mellitus Patient comes in today for recheck of his diabetes. Patient has been currently taking no medication and is on diet control and his last A1c was 5.9. Patient is currently on an ACE inhibitor/ARB. Patient has not seen an ophthalmologist this year. Patient denies any issues with their feet besides onychomycosis.   Hypertension Patient is currently on amlodipine and losartan but it has been difficult to get losartan because he recalls he would like to be switched to something else, and their blood pressure today is 139/77. Patient denies any lightheadedness or dizziness. Patient denies headaches, blurred vision, chest pains, shortness of breath, or weakness. Denies any side effects from medication and is content with current medication.   Hyperlipidemia Patient is coming in for recheck of his hyperlipidemia. The patient is currently taking Lipitor. They deny any issues with myalgias or history of liver damage from it. They deny any focal numbness or weakness or chest pain.  Patient has a Framingham risk score of 19%  GERD Patient is currently on pantoprazole, only takes occasionally and is not having any issues.  She denies any major symptoms or abdominal pain or belching or burping. She denies any blood in her stool or lightheadedness or dizziness.   History of prostate cancer Being managed by urology, chronic  Onychomycosis Patient is coming in for recheck of onychomycosis and it has been improving.  We will continue on dose until clears.  Worsening tremor and  concern for shuffling gait Patient's tremor has been worsening in his right hand and right arm.  He says he still is able to perform daily functions but it just has been more frequent that he is noticing it.  Wife also says he has been having a little more difficulty walking and does not take his feet up as much as he used to.  He denies any balance issues or stiffness.  He does have some baseline memory issues that have been gradually coming on.  Relevant past medical, surgical, family and social history reviewed and updated as indicated. Interim medical history since our last visit reviewed. Allergies and medications reviewed and updated.  Review of Systems  Constitutional: Negative for chills and fever.  Eyes: Negative for discharge.  Respiratory: Negative for shortness of breath and wheezing.   Cardiovascular: Negative for chest pain and leg swelling.  Musculoskeletal: Positive for gait problem. Negative for back pain.  Skin: Negative for rash.  Neurological: Positive for tremors. Negative for weakness and numbness.  Psychiatric/Behavioral: Negative for confusion.  All other systems reviewed and are negative.   Per HPI unless specifically indicated above   Allergies as of 12/02/2017      Reactions   Sulfonamide Derivatives    REACTION: rash      Medication List        Accurate as of 12/02/17 10:00 AM. Always use your most recent med list.          amLODipine 5 MG tablet Commonly  known as:  NORVASC Take 1 tablet (5 mg total) by mouth daily.   aspirin 81 MG tablet Take 81 mg by mouth daily.   atorvastatin 40 MG tablet Commonly known as:  LIPITOR Take 1 tablet (40 mg total) by mouth daily.   Blood Glucose Monitor System w/Device Kit Check blood sugar three times a day Dx:  E11.9   losartan 100 MG tablet Commonly known as:  COZAAR Take 0.5 tablets (50 mg total) by mouth daily.   pantoprazole 40 MG tablet Commonly known as:  PROTONIX Take 1 tablet (40 mg total) by  mouth daily.   terbinafine 250 MG tablet Commonly known as:  LAMISIL Take 1 tablet (250 mg total) by mouth daily.   Vitamin D3 2000 units Tabs Take 2,000 Units by mouth daily.          Objective:    BP 139/77   Pulse 76   Temp (!) 97 F (36.1 C) (Oral)   Ht 5' 6"  (1.676 m)   Wt 187 lb (84.8 kg)   BMI 30.18 kg/m   Wt Readings from Last 3 Encounters:  12/02/17 187 lb (84.8 kg)  08/22/17 187 lb (84.8 kg)  05/26/17 185 lb (83.9 kg)    Physical Exam  Constitutional: He is oriented to person, place, and time. He appears well-developed and well-nourished. No distress.  Eyes: Conjunctivae are normal. No scleral icterus.  Neck: Neck supple. No thyromegaly present.  Cardiovascular: Normal rate, regular rhythm, normal heart sounds and intact distal pulses.  No murmur heard. Pulmonary/Chest: Effort normal and breath sounds normal. No respiratory distress. He has no wheezes.  Musculoskeletal: Normal range of motion. He exhibits no edema.  Lymphadenopathy:    He has no cervical adenopathy.  Neurological: He is alert and oriented to person, place, and time. He displays tremor (Light tremor in right hand and right arm, mostly at rest). No cranial nerve deficit or sensory deficit. He exhibits normal muscle tone (No cogwheel rigidity). Coordination and gait (Patient has a slower gait but no shuffling or difficulty in turning and coming back as noticed on exam) normal.  Skin: Skin is warm and dry. No rash noted. He is not diaphoretic.  Psychiatric: He has a normal mood and affect. His behavior is normal.  Nursing note and vitals reviewed.   Results for orders placed or performed in visit on 08/22/17  CMP14+EGFR  Result Value Ref Range   Glucose 107 (H) 65 - 99 mg/dL   BUN 15 8 - 27 mg/dL   Creatinine, Ser 0.98 0.76 - 1.27 mg/dL   GFR calc non Af Amer 75 >59 mL/min/1.73   GFR calc Af Amer 86 >59 mL/min/1.73   BUN/Creatinine Ratio 15 10 - 24   Sodium 140 134 - 144 mmol/L   Potassium  4.4 3.5 - 5.2 mmol/L   Chloride 101 96 - 106 mmol/L   CO2 22 20 - 29 mmol/L   Calcium 9.4 8.6 - 10.2 mg/dL   Total Protein 6.9 6.0 - 8.5 g/dL   Albumin 4.3 3.5 - 4.8 g/dL   Globulin, Total 2.6 1.5 - 4.5 g/dL   Albumin/Globulin Ratio 1.7 1.2 - 2.2   Bilirubin Total 0.4 0.0 - 1.2 mg/dL   Alkaline Phosphatase 95 39 - 117 IU/L   AST 20 0 - 40 IU/L   ALT 13 0 - 44 IU/L  Lipid panel  Result Value Ref Range   Cholesterol, Total 142 100 - 199 mg/dL   Triglycerides 66 0 - 149  mg/dL   HDL 63 >39 mg/dL   VLDL Cholesterol Cal 13 5 - 40 mg/dL   LDL Calculated 66 0 - 99 mg/dL   Chol/HDL Ratio 2.3 0.0 - 5.0 ratio  Bayer DCA Hb A1c Waived  Result Value Ref Range   Bayer DCA Hb A1c Waived 5.9 <7.0 %      Assessment & Plan:   Problem List Items Addressed This Visit      Cardiovascular and Mediastinum   Hypertension associated with diabetes ([place])   Relevant Medications   amLODipine (NORVASC) 5 MG tablet   atorvastatin (LIPITOR) 40 MG tablet   telmisartan (MICARDIS) 20 MG tablet     Digestive   GERD (gastroesophageal reflux disease)     Endocrine   Hyperlipidemia associated with type 2 diabetes mellitus ([facility]) - Primary   Relevant Medications   amLODipine (NORVASC) 5 MG tablet   atorvastatin (LIPITOR) 40 MG tablet   telmisartan (MICARDIS) 20 MG tablet   Diabetes mellitus type 2, controlled, without complications ([facility])   Relevant Medications   atorvastatin (LIPITOR) 40 MG tablet   telmisartan (MICARDIS) 20 MG tablet   Other Relevant Orders   Bayer DCA Hb A1c Waived     Genitourinary   Adenocarcinoma of prostate ([facility])   Relevant Medications   terbinafine (LAMISIL) 250 MG tablet    Other Visit Diagnoses    Framingham cardiac risk 10-20% in next 10 years       Onychomycosis       Relevant Medications   terbinafine (LAMISIL) 250 MG tablet      Discussed Framingham risk and optimization, patient is currently on all meds for optimization that we could add his levels are  controlled, discussed possible cardiac prevention testing in the future if patient desires.    Follow up plan: Return in about 3 months (around 03/03/2018), or if symptoms worsen or fail to improve.  Counseling provided for all of the vaccine components Orders Placed This Encounter  Procedures  . [facility] Hb A1c [place], MD [facility] 12/02/2017, 10:00 AM

## 2017-12-02 NOTE — Telephone Encounter (Signed)
Insurance denied prior auth for Telmisartan  Must try one of the following :  Irbesartan, olmesartan or Valsartan

## 2017-12-03 ENCOUNTER — Other Ambulatory Visit: Payer: Medicare Other

## 2017-12-03 ENCOUNTER — Encounter: Payer: Self-pay | Admitting: Neurology

## 2017-12-03 ENCOUNTER — Ambulatory Visit: Payer: Medicare Other | Admitting: Neurology

## 2017-12-03 VITALS — BP 138/64 | HR 64 | Ht 66.0 in | Wt 188.0 lb

## 2017-12-03 DIAGNOSIS — R251 Tremor, unspecified: Secondary | ICD-10-CM

## 2017-12-03 DIAGNOSIS — G20 Parkinson's disease: Secondary | ICD-10-CM

## 2017-12-03 DIAGNOSIS — E119 Type 2 diabetes mellitus without complications: Secondary | ICD-10-CM | POA: Diagnosis not present

## 2017-12-03 LAB — TSH: TSH: 1.89 mIU/L (ref 0.40–4.50)

## 2017-12-03 MED ORDER — CARBIDOPA-LEVODOPA 25-100 MG PO TABS: 1.0000 | ORAL_TABLET | Freq: Three times a day (TID) | ORAL | 1 refills | Status: DC

## 2017-12-03 MED ORDER — IRBESARTAN 75 MG PO TABS: 75.0000 mg | ORAL_TABLET | Freq: Every day | ORAL | 1 refills | Status: DC

## 2017-12-03 NOTE — Addendum Note (Signed)
Addended by: Caryl Pina on: 12/03/2017 07:53 AM   Modules accepted: Orders

## 2017-12-03 NOTE — Telephone Encounter (Signed)
lmtcb

## 2017-12-03 NOTE — Patient Instructions (Signed)
1. Start Carbidopa Levodopa as follows:  Take 1/2 tablet three times daily, at least 30 minutes before meals, for one week  Then take 1/2 tablet in the morning, 1/2 tablet in the afternoon, 1 tablet in the evening, at least 30 minutes before meals, for one week  Then take 1/2 tablet in the morning, 1 tablet in the afternoon, 1 tablet in the evening, at least 30 minutes before meals, for one week  Then take 1 tablet three times daily, at least 30 minutes before meals  

## 2017-12-03 NOTE — Telephone Encounter (Signed)
Insurance denied myocarditis, I have sent in Avapro instead which is a replacement blood pressure pill for losartan

## 2017-12-04 ENCOUNTER — Telehealth: Payer: Self-pay | Admitting: Neurology

## 2017-12-04 NOTE — Telephone Encounter (Signed)
-----   Message from [place], DO sent at 12/04/2017  7:16 AM EDT ----- I have reviewed all lab results which are normal or stable. Please inform the patient.

## 2017-12-04 NOTE — Telephone Encounter (Signed)
Mychart message sent to patient.

## 2017-12-17 NOTE — Telephone Encounter (Signed)
lmtcb- attempts have been made to contact patient and no call back - this encounter will be closed.

## 2018-03-04 ENCOUNTER — Ambulatory Visit: Payer: Medicare Other | Admitting: Family Medicine

## 2018-03-04 ENCOUNTER — Encounter: Payer: Self-pay | Admitting: Family Medicine

## 2018-03-04 VITALS — BP 137/71 | HR 67 | Temp 97.6°F | Ht 66.0 in | Wt 186.2 lb

## 2018-03-04 DIAGNOSIS — I10 Essential (primary) hypertension: Secondary | ICD-10-CM | POA: Diagnosis not present

## 2018-03-04 DIAGNOSIS — K219 Gastro-esophageal reflux disease without esophagitis: Secondary | ICD-10-CM

## 2018-03-04 DIAGNOSIS — E1159 Type 2 diabetes mellitus with other circulatory complications: Secondary | ICD-10-CM | POA: Diagnosis not present

## 2018-03-04 DIAGNOSIS — E785 Hyperlipidemia, unspecified: Secondary | ICD-10-CM

## 2018-03-04 DIAGNOSIS — E1169 Type 2 diabetes mellitus with other specified complication: Secondary | ICD-10-CM

## 2018-03-04 DIAGNOSIS — E663 Overweight: Secondary | ICD-10-CM

## 2018-03-04 DIAGNOSIS — I152 Hypertension secondary to endocrine disorders: Secondary | ICD-10-CM

## 2018-03-04 DIAGNOSIS — B351 Tinea unguium: Secondary | ICD-10-CM | POA: Diagnosis not present

## 2018-03-04 LAB — BAYER DCA HB A1C WAIVED: HB A1C (BAYER DCA - WAIVED): 6.1 % (ref <7.0)

## 2018-03-04 MED ORDER — AMLODIPINE BESYLATE 5 MG PO TABS: 5.0000 mg | ORAL_TABLET | Freq: Every day | ORAL | 2 refills | Status: DC

## 2018-03-04 MED ORDER — IRBESARTAN 75 MG PO TABS: 75.0000 mg | ORAL_TABLET | Freq: Every day | ORAL | 2 refills | Status: DC

## 2018-03-04 MED ORDER — PANTOPRAZOLE SODIUM 40 MG PO TBEC: 40.0000 mg | DELAYED_RELEASE_TABLET | ORAL | 3 refills | Status: DC

## 2018-03-04 MED ORDER — ATORVASTATIN CALCIUM 40 MG PO TABS: 40.0000 mg | ORAL_TABLET | Freq: Every day | ORAL | 2 refills | Status: DC

## 2018-03-04 MED ORDER — TERBINAFINE HCL 250 MG PO TABS: 250.0000 mg | ORAL_TABLET | Freq: Every day | ORAL | 1 refills | Status: DC

## 2018-03-04 NOTE — Progress Notes (Signed)
BP 137/71   Pulse 67   Temp 97.6 F (36.4 C) (Oral)   Ht 5' 6"  (1.676 m)   Wt 186 lb 3.2 oz (84.5 kg)   BMI 30.05 kg/m    Subjective:    Patient ID: Paul [name], male    DOB: 1941/04/01, 77 y.o.   MRN: [id number]  HPI: Paul [name] is a 77 y.o. male presenting on 03/04/2018 for Diabetes (3 month follow up) and Hypertension   HPI Hypertension Patient is currently on amlodipine and a irbesartan, and their blood pressure today is 137/71. Patient denies any lightheadedness or dizziness. Patient denies headaches, blurred vision, chest pains, shortness of breath, or weakness. Denies any side effects from medication and is content with current medication.   Type 2 diabetes mellitus Patient comes in today for recheck of his diabetes. Patient has been currently taking no medication currently, last A1c was 5.7, we are just monitoring for now. Patient is currently on an ACE inhibitor/ARB. Patient has not seen an ophthalmologist this year. Patient denies any issues with their feet.   Hyperlipidemia Patient is coming in for recheck of his hyperlipidemia. The patient is currently taking Lipitor. They deny any issues with myalgias or history of liver damage from it. They deny any focal numbness or weakness or chest pain.   Onychomycosis recheck Patient is coming in for recheck of his toenails for onychomycosis.  They have been improving and continues to take the terbinafine.  We will continue it for now.  GERD Patient is currently on Protonix.  She denies any major symptoms or abdominal pain or belching or burping. She denies any blood in her stool or lightheadedness or dizziness.   Patient was recently diagnosed with Parkinson's and started on carbidopa levodopa and his wife feels like he is doing a lot better on it  Relevant past medical, surgical, family and social history reviewed and updated as indicated. Interim medical history since our last visit reviewed. Allergies and  medications reviewed and updated.  Review of Systems  Constitutional: Negative for chills and fever.  Eyes: Negative for visual disturbance.  Respiratory: Negative for shortness of breath and wheezing.   Cardiovascular: Negative for chest pain and leg swelling.  Musculoskeletal: Negative for back pain and gait problem.  Skin: Negative for rash.  Neurological: Negative for dizziness, weakness, light-headedness and headaches.  All other systems reviewed and are negative.   Per HPI unless specifically indicated above   Allergies as of 03/04/2018      Reactions   Sulfonamide Derivatives    REACTION: rash      Medication List        Accurate as of 03/04/18  9:54 AM. Always use your most recent med list.          amLODipine 5 MG tablet Commonly known as:  NORVASC Take 1 tablet (5 mg total) by mouth daily.   aspirin 81 MG tablet Take 81 mg by mouth daily.   atorvastatin 40 MG tablet Commonly known as:  LIPITOR Take 1 tablet (40 mg total) by mouth daily.   Blood Glucose Monitor System w/Device Kit Check blood sugar three times a day Dx:  E11.9   carbidopa-levodopa 25-100 MG tablet Commonly known as:  SINEMET IR Take 1 tablet by mouth 3 (three) times daily.   irbesartan 75 MG tablet Commonly known as:  AVAPRO Take 1 tablet (75 mg total) by mouth daily.   pantoprazole 40 MG tablet Commonly known as:  PROTONIX Take 1  tablet (40 mg total) by mouth every other day.   terbinafine 250 MG tablet Commonly known as:  LAMISIL Take 1 tablet (250 mg total) by mouth daily.   Vitamin D3 2000 units Tabs Take 2,000 Units by mouth daily.          Objective:    BP 137/71   Pulse 67   Temp 97.6 F (36.4 C) (Oral)   Ht 5' 6"  (1.676 m)   Wt 186 lb 3.2 oz (84.5 kg)   BMI 30.05 kg/m   Wt Readings from Last 3 Encounters:  03/04/18 186 lb 3.2 oz (84.5 kg)  12/03/17 188 lb (85.3 kg)  12/02/17 187 lb (84.8 kg)    Physical Exam  Constitutional: He is oriented to person,  place, and time. He appears well-developed and well-nourished. No distress.  Eyes: Conjunctivae are normal. No scleral icterus.  Neck: Neck supple. No thyromegaly present.  Cardiovascular: Normal rate, regular rhythm, normal heart sounds and intact distal pulses.  No murmur heard. Pulmonary/Chest: Effort normal and breath sounds normal. No respiratory distress. He has no wheezes.  Musculoskeletal: Normal range of motion. He exhibits no edema.  Lymphadenopathy:    He has no cervical adenopathy.  Neurological: He is alert and oriented to person, place, and time. Coordination normal.  Skin: Skin is warm and dry. No rash noted. He is not diaphoretic.  Thickened and yellow toenails, about halfway grown out on both great toenails  Psychiatric: He has a normal mood and affect. His behavior is normal.  Nursing note and vitals reviewed.       Assessment & Plan:   Problem List Items Addressed This Visit      Cardiovascular and Mediastinum   Hypertension associated with diabetes ([place]) (Chronic)   Relevant Medications   atorvastatin (LIPITOR) 40 MG tablet   amLODipine (NORVASC) 5 MG tablet   irbesartan (AVAPRO) 75 MG tablet   Other Relevant Orders   CMP14+EGFR     Digestive   GERD (gastroesophageal reflux disease)   Relevant Medications   pantoprazole (PROTONIX) 40 MG tablet   Other Relevant Orders   CBC with Differential/Platelet     Endocrine   Hyperlipidemia associated with type 2 diabetes mellitus (HCC)   Relevant Medications   atorvastatin (LIPITOR) 40 MG tablet   amLODipine (NORVASC) 5 MG tablet   irbesartan (AVAPRO) 75 MG tablet   Other Relevant Orders   Lipid panel   Type 2 diabetes mellitus with other specified complication (HCC) - Primary   Relevant Medications   atorvastatin (LIPITOR) 40 MG tablet   irbesartan (AVAPRO) 75 MG tablet   Other Relevant Orders   Microalbumin / creatinine urine ratio   Bayer DCA Hb A1c Waived   CMP14+EGFR     Other   Overweight    Relevant Orders   CBC with Differential/Platelet    Other Visit Diagnoses    Onychomycosis       Relevant Medications   terbinafine (LAMISIL) 250 MG tablet       Follow up plan: Return in about 6 months (around 09/04/2018), or if symptoms worsen or fail to improve, for Diabetes and hypertension follow-up.  Counseling provided for all of the vaccine components Orders Placed This Encounter  Procedures  . Microalbumin / creatinine urine ratio  . Bayer DCA Hb A1c Waived  . CMP14+EGFR  . Lipid panel  . CBC with Differential/Platelet    Caryl Pina, MD [facility] 03/04/2018, 9:54 AM

## 2018-03-05 LAB — CBC WITH DIFFERENTIAL/PLATELET
BASOS: 1 %
Basophils Absolute: 0.1 10*3/uL (ref 0.0–0.2)
EOS (ABSOLUTE): 0.2 10*3/uL (ref 0.0–0.4)
EOS: 2 %
HEMATOCRIT: 42.7 % (ref 37.5–51.0)
Hemoglobin: 14.2 g/dL (ref 13.0–17.7)
IMMATURE GRANS (ABS): 0 10*3/uL (ref 0.0–0.1)
IMMATURE GRANULOCYTES: 0 %
LYMPHS: 30 %
Lymphocytes Absolute: 2.7 10*3/uL (ref 0.7–3.1)
MCH: 31.7 pg (ref 26.6–33.0)
MCHC: 33.3 g/dL (ref 31.5–35.7)
MCV: 95 fL (ref 79–97)
MONOCYTES: 9 %
Monocytes Absolute: 0.8 10*3/uL (ref 0.1–0.9)
NEUTROS PCT: 58 %
Neutrophils Absolute: 5.1 10*3/uL (ref 1.4–7.0)
Platelets: 317 10*3/uL (ref 150–450)
RBC: 4.48 x10E6/uL (ref 4.14–5.80)
RDW: 12.8 % (ref 12.3–15.4)
WBC: 8.9 10*3/uL (ref 3.4–10.8)

## 2018-03-05 LAB — CMP14+EGFR
ALBUMIN: 4.5 g/dL (ref 3.5–4.8)
ALK PHOS: 123 IU/L — AB (ref 39–117)
ALT: 19 IU/L (ref 0–44)
AST: 16 IU/L (ref 0–40)
Albumin/Globulin Ratio: 1.7 (ref 1.2–2.2)
BUN/Creatinine Ratio: 17 (ref 10–24)
BUN: 18 mg/dL (ref 8–27)
Bilirubin Total: 0.4 mg/dL (ref 0.0–1.2)
CHLORIDE: 102 mmol/L (ref 96–106)
CO2: 23 mmol/L (ref 20–29)
CREATININE: 1.03 mg/dL (ref 0.76–1.27)
Calcium: 9.5 mg/dL (ref 8.6–10.2)
GFR calc Af Amer: 81 mL/min/{1.73_m2} (ref >59)
GFR calc non Af Amer: 70 mL/min/{1.73_m2} (ref >59)
GLUCOSE: 101 mg/dL — AB (ref 65–99)
Globulin, Total: 2.6 g/dL (ref 1.5–4.5)
POTASSIUM: 4.2 mmol/L (ref 3.5–5.2)
SODIUM: 141 mmol/L (ref 134–144)
TOTAL PROTEIN: 7.1 g/dL (ref 6.0–8.5)

## 2018-03-05 LAB — LIPID PANEL
CHOLESTEROL TOTAL: 148 mg/dL (ref 100–199)
Chol/HDL Ratio: 2.6 ratio (ref 0.0–5.0)
HDL: 58 mg/dL (ref >39)
LDL Calculated: 76 mg/dL (ref 0–99)
Triglycerides: 70 mg/dL (ref 0–149)
VLDL Cholesterol Cal: 14 mg/dL (ref 5–40)

## 2018-03-05 LAB — MICROALBUMIN / CREATININE URINE RATIO
CREATININE, UR: 165.8 mg/dL
MICROALBUM., U, RANDOM: 8.5 ug/mL
Microalb/Creat Ratio: 5.1 mg/g creat (ref 0.0–30.0)

## 2018-04-08 NOTE — Progress Notes (Signed)
Paul [name] was seen today in the movement disorders clinic for neurologic consultation at the request of Dettinger, Fransisca Kaufmann, MD.  The consultation is for the evaluation of tremor.  The records that were made available to me were reviewed.   This patient is accompanied in the office by his spouse who supplements the history.    04/10/18 update: Patient is seen today in follow-up for Parkinson's disease which was newly diagnosed last visit.  He was started on carbidopa/levodopa 25/100 and is now on 1 tablet 3 times per day (8:30am/12:30pm/4:30pm).  He has tolerated the medication well.  Wife notices much less shuffling and not stooping as much.  No wearing off of the medication.  No falls.    He denies side effects with medication.  No lightheadedness or near syncope.  No hallucinations.  Records are reviewed since last visit.  Saw his primary care physician on March 04, 2018.  He is not doing exercise but does "work some around the house."  Patient's wife notes noted that he is sleepy during the day if he is not active.  This started prior to starting levodopa.  He does snore at night.  There may be periods of apnea that she notices, but she is not sure if he is just breathing quieter.  He does not act out the dreams.  PREVIOUS MEDICATIONS: none to date  ALLERGIES:   Allergies  Allergen Reactions  . Sulfonamide Derivatives     REACTION: rash    CURRENT MEDICATIONS:  Outpatient Encounter Medications as of 04/10/2018  Medication Sig  . amLODipine (NORVASC) 5 MG tablet Take 1 tablet (5 mg total) by mouth daily.  Marland Kitchen aspirin 81 MG tablet Take 81 mg by mouth daily.  Marland Kitchen atorvastatin (LIPITOR) 40 MG tablet Take 1 tablet (40 mg total) by mouth daily.  . carbidopa-levodopa (SINEMET IR) 25-100 MG tablet Take 1 tablet by mouth 3 (three) times daily.  . Cholecalciferol (VITAMIN D3) 2000 UNITS TABS Take 2,000 Units by mouth daily.  . irbesartan (AVAPRO) 75 MG tablet Take 1 tablet (75 mg total) by mouth  daily.  . pantoprazole (PROTONIX) 40 MG tablet Take 1 tablet (40 mg total) by mouth every other day.  . terbinafine (LAMISIL) 250 MG tablet Take 1 tablet (250 mg total) by mouth daily.  . [DISCONTINUED] carbidopa-levodopa (SINEMET IR) 25-100 MG tablet Take 1 tablet by mouth 3 (three) times daily.  . Blood Glucose Monitoring Suppl (BLOOD GLUCOSE MONITOR SYSTEM) w/Device KIT Check blood sugar three times a day Dx:  E11.9 (Patient not taking: Reported on 04/10/2018)   No facility-administered encounter medications on file as of 04/10/2018.     PAST MEDICAL HISTORY:   Past Medical History:  Diagnosis Date  . 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 Diabetes mellitus without complication ([place])   . Full dentures    does not wear bottoms  . GERD (gastroesophageal reflux disease)   . Hyperlipidemia   . Hypertension   . Iron deficiency anemia    presumped malabsorption; neg EGD/ capsule endos/colosopcy  . Iron deficiency anemia, unspecified 10/16/2012  . Kidney stones   . Parkinson's disease ([place])     PAST SURGICAL HISTORY:   Past Surgical History:  Procedure Laterality Date  . COLONOSCOPY    . HEMORRHOID SURGERY N/A 07/05/2014   Procedure: HEMORRHOIDECTOMY;  Surgeon: Erroll Luna, MD;  Location: [facility];  Service: General;  Laterality: N/A;  . ORIF RADIAL FRACTURE     right-as child  . robtic assisted laparoscopic     radical prostatectomy and bilateral pelvic lymphadenectomy  . UPPER GI ENDOSCOPY      SOCIAL HISTORY:   Social History   Socioeconomic History  . Marital status: Married    Spouse name: Not on file  . Number of children: Not on file  . Years of education: Not on file  . Highest education level: Not on file  Occupational History  . Occupation: retired    Comment: Architect work  Scientific laboratory technician  . Financial resource strain: Not on  file  . Food insecurity:    Worry: Not on file    Inability: Not on file  . Transportation needs:    Medical: Not on file    Non-medical: Not on file  Tobacco Use  . Smoking status: Former Smoker    Last attempt to quit: 09/18/1981    Years since quitting: 36.5  . Smokeless tobacco: Never Used  Substance and Sexual Activity  . Alcohol use: No  . Drug use: No  . Sexual activity: Not on file  Lifestyle  . Physical activity:    Days per week: Not on file    Minutes per session: Not on file  . Stress: Not on file  Relationships  . Social connections:    Talks on phone: Not on file    Gets together: Not on file    Attends religious service: Not on file    Active member of club or organization: Not on file    Attends meetings of clubs or organizations: Not on file    Relationship status: Not on file  . Intimate partner violence:    Fear of current or ex partner: Not on file    Emotionally abused: Not on file    Physically abused: Not on file    Forced sexual activity: Not on file  Other Topics Concern  . Not on file  Social History Narrative  . Not on file    FAMILY HISTORY:   Family Status  Relation Name Status  . Mother  Deceased at age 48  . Father  Deceased at age 23       heart attack  . Brother  Deceased       age 35 stents  . Sister  Deceased  . Sister  Alive  . Sister  Alive  . Brother  Deceased  . Brother  Alive  . Brother  Alive  . Son  Alive    ROS:  Review of Systems  Constitutional: Negative.   HENT: Negative.   Eyes: Negative.   Respiratory: Negative.   Cardiovascular: Negative.   Gastrointestinal: Negative.   Genitourinary: Negative.   Musculoskeletal: Negative.   Skin: Negative.      PHYSICAL EXAMINATION:    VITALS:   Vitals:   04/10/18 0906  BP: 120/64  Pulse: 66  SpO2: 90%  Weight: 189 lb (85.7 kg)  Height: 5' 6.5" (1.689 m)    GEN:  The patient appears stated age and is in NAD. HEENT:  Normocephalic, atraumatic.  The  mucous membranes are moist. The superficial temporal arteries are without ropiness or tenderness. CV:  RRR Lungs:  CTAB Neck/HEME:  There are no carotid bruits bilaterally.  Neurological examination:  Orientation: The patient is alert and oriented x3. Cranial nerves: There is good facial symmetry. The speech is fluent and clear.  Speech is hypophonic.  Soft palate rises symmetrically and there is no tongue deviation. Hearing is intact to conversational tone. Sensation: Sensation is intact to light touch throughout Motor: Strength is 5/5 in the bilateral upper and lower extremities.   Shoulder shrug is equal and symmetric.  There is no pronator drift.  Movement examination: Tone: There is minimal increased tone in the RUE.   Abnormal movements: There is mild RUE resting tremor.  None on the left Coordination:  There is no decremation, with any form of RAMS, including alternating supination and pronation of the forearm, hand opening and closing, finger taps, heel taps and toe taps. Gait and Station: The patient has no difficulty arising out of a deep-seated chair without the use of the hands. The patient's stride length is good with slightly reduced arm swing    Labs: Lab Results  Component Value Date   TSH 1.89 12/03/2017     Chemistry      Component Value Date/Time   NA 141 03/04/2018 1005   NA 141 04/06/2013 0956   K 4.2 03/04/2018 1005   K 4.1 04/06/2013 0956   CL 102 03/04/2018 1005   CL 106 10/16/2012 1022   CO2 23 03/04/2018 1005   CO2 24 04/06/2013 0956   BUN 18 03/04/2018 1005   BUN 16.7 04/06/2013 0956   CREATININE 1.03 03/04/2018 1005   CREATININE 1.0 04/06/2013 0956      Component Value Date/Time   CALCIUM 9.5 03/04/2018 1005   CALCIUM 9.7 04/06/2013 0956   ALKPHOS 123 (H) 03/04/2018 1005   ALKPHOS 101 04/06/2013 0956   AST 16 03/04/2018 1005   AST 24 04/06/2013 0956   ALT 19 03/04/2018 1005   ALT 23 04/06/2013 0956   BILITOT 0.4 03/04/2018 1005   BILITOT  0.34 04/06/2013 0956       ASSESSMENT/PLAN:  1.  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  The patient has tremor, bradykinesia, rigidity  -We discussed the diagnosis as well as pathophysiology of the disease.  We discussed treatment options as well as prognostic indicators.  Patient education was provided.  -continue carbidopa/levodopa 25/100 tid.  RF provided  -encouraged exercise.  Resources given  -met with my social worker today  2.  EDS  -sleep apnea in the Ddx.  Discussed morbidity and mortality associated with sleep apnea.  I would recommend a nocturnal polysomnogram.  Ultimately, they declined.  He was offered one in the home but they declined that as well.  They will let me know if they change their mind.  3.  F/u 5-6 months.  Much greater than 50% of this visit was spent in counseling and coordinating care.  Total face to face time:  25 min  Cc:  Dettinger, Fransisca Kaufmann, MD

## 2018-04-10 ENCOUNTER — Encounter: Payer: Self-pay | Admitting: Psychology

## 2018-04-10 ENCOUNTER — Ambulatory Visit: Payer: Medicare Other | Admitting: Neurology

## 2018-04-10 ENCOUNTER — Encounter: Payer: Self-pay | Admitting: Neurology

## 2018-04-10 VITALS — BP 120/64 | HR 66 | Ht 66.5 in | Wt 189.0 lb

## 2018-04-10 DIAGNOSIS — G20 Parkinson's disease: Secondary | ICD-10-CM | POA: Diagnosis not present

## 2018-04-10 DIAGNOSIS — G4710 Hypersomnia, unspecified: Secondary | ICD-10-CM

## 2018-04-10 MED ORDER — CARBIDOPA-LEVODOPA 25-100 MG PO TABS: 1.0000 | ORAL_TABLET | Freq: Three times a day (TID) | ORAL | 1 refills | Status: DC

## 2018-04-10 NOTE — Progress Notes (Signed)
I met with the patient and his wife today while they were in the clinic. We talked a little about integrating exercise for PD. The patient identifies with being active. We talked about the importance of both exercise paired with activity. They live close to [facility] so the local [employer] and silver sneakers program may be a good fit. They also have a stationary bike at home. I suggested attending a few classes at [facility] may help the patient to develop a cycle routine at home.     [place] have three young grandchildren. Their grandchildren will soon be residing very close to the patient as thier parents are building a house on the land close by.

## 2018-04-10 NOTE — Patient Instructions (Signed)
Good to see you!  You are looking great!   I would love to see you exercising!  Community Parkinson's Exercise Programs   Parkinson's Wellness Recovery Exercise Programs:   PWR! Moves PD Exercise Class:  This is a therapist-led exercise class for people with Parkinson's disease in the [place] community. It consists of a one-hour exercise class each week. Classes are offered in eight-week sessions, and the cost per session is $80. Class size is limited to a maximum of 20 participants. Participant criteria includes: Participant must be able to get up and down from the floor with minimal to no assistance, have had 0-1 falls in the past 6 months, and have completed physical or occupational therapy at [facility] within the past year.  To find out more about session dates, questions, or to register, please contact Mady Haagensen, Physical Therapist, or Nita Sells, Physical Brewing technologist, at [facility] at [phone].  PWR! Circuit Class:  This is a therapist-led exercise class with intervals of circuit activities incorporating PWR! Moves into functional activities. It consists of one 45-minute exercise class per week. Classes are offered in eight-week sessions, and the cost per session is $120. Class size is limited to a maximum of eight participants to allow for hands-on instruction. Participant criteria: class is ideal for people with Parkinson's disease who have completed PWR! Moves Exercise Class or who are currently independently exercising and want to be challenged, must be able to walk independently with 0-1 falls in the past 6 months, able to get up and down from the floor independently, able to sit to stand independently, and able to jog 20 feet.   To find out more about session dates, questions, or to register, please contact Mady Haagensen, Physical Therapist, or Nita Sells, Physical Brewing technologist, at [facility] at [phone].   [employer] Parkinson's Cycle:   Parkinson's Cycle Class at [facility] This is an ongoing class on Monday and Thursday mornings at 10:45 a.m. A healthcare provider referral is required to enroll. This class is FREE to participants, and you do not have to be a member of the [employer] to enroll. Contact Beth at [phone] or [email or web address]. Parkinson's Cycle Class at [facility] Ongoing Class Monday, Wednesday, and Friday mornings at 9:00 a.m. A healthcare provider referral is required to enroll. This class is FREE to participants, and you do not have to be a member of the [employer] to enroll. Contact Marlee at [phone] or [email or web address]. Parkinson's Cycle Class at [facility] Ongoing Class every Friday mornings at 12 p.m.  A healthcare provider referral is required to enroll. This class is FREE to participants, and you do not have to be a member of the [employer] to enroll. Contact [phone].  Parkinson's Cycle Class at [facility] Ongoing Class every Monday at 12pm.  A healthcare provider referral is required to enroll. This class is FREE to participants, and you do not have to be a member of the [employer] to enroll. Contact Almyra Free at [phone] or  [email or web address].   [employer]:  [employer]  Classes are offered Mondays at 5:15 p.m. and Tuesdays and Thursdays at 12 p.m. at [employer]. For more information, contact [phone] or visit [email or web address] or [email or web address]. [employer] [place] Classes are offered Monday, Wednesday, and Friday from 9:30 a.m. - 11:00 a.m. For more information, contact [phone] or [phone] or email [email or web address] or visit [email or web address] or [email or web address]. [employer] (classes are offered at 2 locations) . [employer] [facility]  [employer] in [place] (for more information, contact [place] at [phone] or email [email or web address] . Cathren Laine at [facility] (class is open to the public -- for more information, contact Clabe Seal at [phone] or email [email or web address]) [place] are held at [facility] in [place], [place]. For more information, call Dr. Bing Plume at [phone] or [email or web address].   Personal Training for Parkinson's:   [employer] Offers certified personal training to customize a program to meet your exercise needs to address Parkinson's disease. For more information, contact [phone] or visit [email or web address].  Community Dance for Parkinson's:   Community dance class for people with Parkinson's Disease Wednesdays at 9 a.m. [employer] [place], [place] [address] Please contact Eliberto Ivory [phone] for more information  Scholarships Available for Fitness Programs:  The [place] for [employer] is a non-profit 501(C)3 organization run by volunteers, whose mission is to strive to empower those living with Parkinson's Disease (PD), Progressive Supra-Nuclear Palsy (PSP) and Multiple System Atrophy ([place]).  Through financial support, recipients benefit from individual and group programs. [phone] [email or web address]

## 2018-05-11 DIAGNOSIS — C61 Malignant neoplasm of prostate: Secondary | ICD-10-CM | POA: Diagnosis not present

## 2018-05-11 DIAGNOSIS — Z8546 Personal history of malignant neoplasm of prostate: Secondary | ICD-10-CM | POA: Diagnosis not present

## 2018-05-11 DIAGNOSIS — N393 Stress incontinence (female) (male): Secondary | ICD-10-CM | POA: Diagnosis not present

## 2018-05-21 ENCOUNTER — Ambulatory Visit (INDEPENDENT_AMBULATORY_CARE_PROVIDER_SITE_OTHER): Payer: Medicare Other

## 2018-05-21 DIAGNOSIS — Z23 Encounter for immunization: Secondary | ICD-10-CM

## 2018-05-28 ENCOUNTER — Encounter: Payer: Medicare Other | Admitting: *Deleted

## 2018-06-26 ENCOUNTER — Ambulatory Visit (INDEPENDENT_AMBULATORY_CARE_PROVIDER_SITE_OTHER): Payer: Medicare Other | Admitting: *Deleted

## 2018-06-26 ENCOUNTER — Encounter: Payer: Self-pay | Admitting: *Deleted

## 2018-06-26 VITALS — BP 136/74 | HR 76 | Ht 66.0 in | Wt 192.0 lb

## 2018-06-26 DIAGNOSIS — Z0000 Encounter for general adult medical examination without abnormal findings: Secondary | ICD-10-CM | POA: Diagnosis not present

## 2018-06-26 DIAGNOSIS — Z23 Encounter for immunization: Secondary | ICD-10-CM

## 2018-06-26 NOTE — Patient Instructions (Addendum)
[name] , Thank you for taking time to come for your Medicare Wellness Visit. I appreciate your ongoing commitment to your health goals. Please review the following plan we discussed and let me know if I can assist you in the future.   These are the goals we discussed: Goals    . Exercise 150 minutes per week (moderate activity)    . Weight (lb) < 190 lb (86.2 kg)       This is a list of the screening recommended for you and due dates:  Health Maintenance  Topic Date Due  . Eye exam for diabetics  06/28/1951  . Complete foot exam   08/22/2018  . Hemoglobin A1C  09/04/2018  . Tetanus Vaccine  11/24/2023  . Flu Shot  Completed  . Pneumonia vaccines  Completed    Recombinant Zoster (Shingles) Vaccine, RZV: What You Need to Know 1. Why get vaccinated? Shingles (also called herpes zoster, or just zoster) is a painful skin rash, often with blisters. Shingles is caused by the varicella zoster virus, the same virus that causes chickenpox. After you have chickenpox, the virus stays in your body and can cause shingles later in life. You can't catch shingles from another person. However, a person who has never had chickenpox (or chickenpox vaccine) could get chickenpox from someone with shingles. A shingles rash usually appears on one side of the face or body and heals within 2 to 4 weeks. Its main symptom is pain, which can be severe. Other symptoms can include fever, headache, chills and upset stomach. Very rarely, a shingles infection can lead to pneumonia, hearing problems, blindness, brain inflammation (encephalitis), or death. For about 1 person in 5, severe pain can continue even long after the rash has cleared up. This long-lasting pain is called post-herpetic neuralgia (PHN). Shingles is far more common in people 77 years of age and older than in younger people, and the risk increases with age. It is also more common in people whose immune system is weakened because of a disease such as  cancer, or by drugs such as steroids or chemotherapy. At least 1 million people a year in the [place] get shingles. 2. Shingles vaccine (recombinant) Recombinant shingles vaccine was approved by FDA in 2017 for the prevention of shingles. In clinical trials, it was more than 90% effective in preventing shingles. It can also reduce the likelihood of PHN. Two doses, 2 to 6 months apart, are recommended for adults 64 and older. This vaccine is also recommended for people who have already gotten the live shingles vaccine (Zostavax). There is no live virus in this vaccine. 3. Some people should not get this vaccine Tell your vaccine provider if you:  Have any severe, life-threatening allergies. A person who has ever had a life-threatening allergic reaction after a dose of recombinant shingles vaccine, or has a severe allergy to any component of this vaccine, may be advised not to be vaccinated. Ask your health care provider if you want information about vaccine components.  Are pregnant or breastfeeding. There is not much information about use of recombinant shingles vaccine in pregnant or nursing women. Your healthcare provider might recommend delaying vaccination.  Are not feeling well. If you have a mild illness, such as a cold, you can probably get the vaccine today. If you are moderately or severely ill, you should probably wait until you recover. Your doctor can advise you.  4. Risks of a vaccine reaction With any medicine, including vaccines,  there is a chance of reactions. After recombinant shingles vaccination, a person might experience:  Pain, redness, soreness, or swelling at the site of the injection  Headache, muscle aches, fever, shivering, fatigue  In clinical trials, most people got a sore arm with mild or moderate pain after vaccination, and some also had redness and swelling where they got the shot. Some people felt tired, had muscle pain, a headache, shivering, fever,  stomach pain, or nausea. About 1 out of 6 people who got recombinant zoster vaccine experienced side effects that prevented them from doing regular activities. Symptoms went away on their own in about 2 to 3 days. Side effects were more common in younger people. You should still get the second dose of recombinant zoster vaccine even if you had one of these reactions after the first dose. Other things that could happen after this vaccine:  People sometimes faint after medical procedures, including vaccination. Sitting or lying down for about 15 minutes can help prevent fainting and injuries caused by a fall. Tell your provider if you feel dizzy or have vision changes or ringing in the ears.  Some people get shoulder pain that can be more severe and longer-lasting than routine soreness that can follow injections. This happens very rarely.  Any medication can cause a severe allergic reaction. Such reactions to a vaccine are estimated at about 1 in a million doses, and would happen within a few minutes to a few hours after the vaccination. As with any medicine, there is a very remote chance of a vaccine causing a serious injury or death. The safety of vaccines is always being monitored. For more information, visit: http://www.aguilar.org/ 5. What if there is a serious problem? What should I look for?  Look for anything that concerns you, such as signs of a severe allergic reaction, very high fever, or unusual behavior. Signs of a severe allergic reaction can include hives, swelling of the face and throat, difficulty breathing, a fast heartbeat, dizziness, and weakness. These would usually start a few minutes to a few hours after the vaccination. What should I do?  If you think it is a severe allergic reaction or other emergency that can't wait, call 9-1-1 and get to the nearest hospital. Otherwise, call your health care provider. Afterward, the reaction should be reported to the Vaccine Adverse  Event Reporting System (VAERS). Your doctor should file this report, or you can do it yourself through the VAERS web site atwww.vaers.https://www.bray.com/ by calling 930-672-6516. VAERS does not give medical advice. 6. How can I learn more?  Ask your healthcare provider. He or she can give you the vaccine package insert or suggest other sources of information.  Call your local or state health department.  Contact the Centers for Disease Control and Prevention (CDC): ? Call 929-798-3483 (1-800-CDC-INFO) or ? Visit the CDC's website at http://hunter.com/ CDC Vaccine Information Statement (VIS) Recombinant Zoster Vaccine (09/16/2016) This information is not intended to replace advice given to you by your health care provider. Make sure you discuss any questions you have with your health care provider. Document Released: 10/01/2016 Document Revised: 10/01/2016 Document Reviewed: 10/01/2016 Elsevier Interactive Patient Education  [employer].

## 2018-06-26 NOTE — Progress Notes (Addendum)
Subjective:   Paul [name] is a 77 y.o. male who presents for a subsewuent Medicare Annual Wellness Visit. He is accompanied by his wife.  Patient Care Team: Dettinger, Fransisca Kaufmann, MD as PCP - General (Family Medicine) Elease Etienne as Physician Assistant (Physician Assistant) Chipper Herb, MD as Attending Physician (Family Medicine) Myrlene Broker, MD as Attending Physician (Urology) Tat, Eustace Quail, DO as Consulting Physician (Neurology)  Hospitalizations, surgeries, and ER visits in previous 12 months No hospitalizations, ER visits, or surgeries this past year.   Review of Systems    Patient reports that his overall health is unchanged compared to last year.  Neuro: new diagnosis of Parkinson's this past year. Seeing Dr Tat regularly. Sinemet has really helped to decrease his hand tremors and gait. He has noticed some decrease in memory and they have been advised by neurologist that memory loss is a component of Parkinson's. They will keep this in mind and will notify their doctor if anything worsens.  Cardiac Risk Factors include: advanced age (>84mn, >>4women);hypertension;male gender;obesity (BMI >30kg/m2);dyslipidemia   All other systems negative     Current Medications (verified) Outpatient Encounter Medications as of 06/26/2018  Medication Sig  . amLODipine (NORVASC) 5 MG tablet Take 1 tablet (5 mg total) by mouth daily.  .Marland Kitchenaspirin 81 MG tablet Take 81 mg by mouth daily.  .Marland Kitchenatorvastatin (LIPITOR) 40 MG tablet Take 1 tablet (40 mg total) by mouth daily.  . Blood Glucose Monitoring Suppl (BLOOD GLUCOSE MONITOR SYSTEM) w/Device KIT Check blood sugar three times a day Dx:  E11.9  . carbidopa-levodopa (SINEMET IR) 25-100 MG tablet Take 1 tablet by mouth 3 (three) times daily.  . Cholecalciferol (VITAMIN D3) 2000 UNITS TABS Take 2,000 Units by mouth daily.  . irbesartan (AVAPRO) 75 MG tablet Take 1 tablet (75 mg total) by mouth daily.  . pantoprazole  (PROTONIX) 40 MG tablet Take 1 tablet (40 mg total) by mouth every other day.  . terbinafine (LAMISIL) 250 MG tablet Take 1 tablet (250 mg total) by mouth daily.   No facility-administered encounter medications on file as of 06/26/2018.     Allergies (verified) Sulfonamide derivatives   History: Past Medical History:  Diagnosis Date  . [place] [place]   Gleason 7; s/p prostatectomy; involving both lobes; No extraprostatic involvement; neg margin;  pT2c, pN0, M0   . [place]   . Full dentures    does not wear bottoms  . GERD (gastroesophageal reflux disease)   . Hyperlipidemia   . Hypertension   . Iron deficiency anemia    presumped malabsorption; neg EGD/ capsule endos/colosopcy  . Iron deficiency anemia, unspecified 10/16/2012  . Kidney stones   . Parkinson's disease ([facility]    Past Surgical History:  Procedure Laterality Date  . COLONOSCOPY    . HEMORRHOID SURGERY N/A 07/05/2014   Procedure: HEMORRHOIDECTOMY;  Surgeon: TErroll Luna MD;  [facility]  Service: General;  Laterality: N/A;  . ORIF RADIAL FRACTURE     right-as child  . robtic assisted laparoscopic     radical prostatectomy and bilateral pelvic lymphadenectomy  . UPPER GI ENDOSCOPY     Family History  Problem Relation Age of Onset  . Heart failure Mother   . Diabetes type II Mother   . Diabetes Mother   . Heart attack Father   . Diabetes Brother   . Diabetes Sister   . Cancer Sister  colon  . Diabetes Sister   . Diabetes Brother   . Cancer Brother        bone and leukemia  . Diabetes Brother   . Diabetes Brother   . Cancer Brother    Social History   Socioeconomic History  . Marital status: Married    Spouse name: Not on file  . Number of children: 1  . Years of education: 65  . Highest education level: 10th grade  Occupational History  . Occupation: retired    Comment: Architect work  Scientific laboratory technician    . Financial resource strain: Not hard at all  . Food insecurity:    Worry: Never true    Inability: Never true  . Transportation needs:    Medical: No    Non-medical: No  Tobacco Use  . Smoking status: Former Smoker    Packs/day: 1.00    Years: 25.00    Pack years: 25.00    Last attempt to quit: 09/18/1981    Years since quitting: 36.7  . Smokeless tobacco: Never Used  Substance and Sexual Activity  . Alcohol use: No  . Drug use: No  . Sexual activity: Yes  Lifestyle  . Physical activity:    Days per week: 0 days    Minutes per session: 0 min  . Stress: Only a little  Relationships  . Social connections:    Talks on phone: More than three times a week    Gets together: More than three times a week    Attends religious service: More than 4 times per year    Active member of club or organization: No    Attends meetings of clubs or organizations: Never    Relationship status: Married  Other Topics Concern  . Not on file  Social History Narrative   06/26/18 Paul [name] lives at home with his wife of 22 years. 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 His son and 3 grandchildren live down the road. His son's family recently moved into a new home after living with Paul Minton and his wife for the past 2 years. They still see them often. He and his wife area active in church and have friends that they eat out with often     Clinical Intake:     Pain : No/denies pain     Nutritional Status: BMI > 30  Obese(Eats 3 meals day. Eats mostly at home but will eat out on occassion. Drink water, tea, sprite, and coffee.) Nutritional Risks: None Diabetes: No  How often do you need to have someone help you when you read instructions, pamphlets, or other written materials from your doctor or pharmacy?: 1 - Never What is the last grade level you completed in school?: 10     Information entered by :: Chong Sicilian, RN   Activities of  Daily Living In your present state of health, do you have any difficulty performing the following activities: 06/26/2018  Hearing? N  Vision? N  Comment Due to schedule an exam with [facility]  Difficulty concentrating or making decisions? N  Walking or climbing stairs? N  Dressing or bathing? N  Doing errands, shopping? N  Preparing Food and eating ? N  Using the Toilet? N  In the past six months, have you accidently leaked urine? N  Do you have problems with loss of bowel control? N  Managing your Medications? N  Managing  your Finances? N  Housekeeping or managing your Housekeeping? N  Some recent data might be hidden     Exercise Current Exercise Habits: The patient does not participate in regular exercise at present, Exercise limited by: neurologic condition(s)   Depression Screen PHQ 2/9 Scores 06/26/2018 03/04/2018 12/02/2017 08/22/2017 05/26/2017 05/21/2017 02/17/2017  PHQ - 2 Score 0 0 0 0 0 0 0     Fall Risk Fall Risk  06/26/2018 06/26/2018 04/10/2018 03/04/2018 12/03/2017  Falls in the past year? (No Data) 0 No No No  Comment No stairs inside home. Has a walkin shower. - - - -  Number falls in past yr: - 0 - - -  Injury with Fall? - 0 - - -  Follow up - Falls prevention discussed - - -     Objective:    Today's Vitals   06/26/18 1114  BP: 136/74  Pulse: 76  Weight: 192 lb (87.1 kg)  Height: 5' 6"  (1.676 m)   Body mass index is 30.99 kg/m.  Advanced Directives 05/26/2017 12/08/2014 07/05/2014 06/27/2014  Does Patient Have a Medical Advance Directive? Yes No No No  Type of Paramedic of [place];Living will - - -  Copy of [place] in Chart? No - copy requested - - -  Would patient like information on creating a medical advance directive? No - Patient declined No - patient declined information No - patient declined information No - patient declined information    Hearing/Vision  No hearing or vision deficits noted during  visit.  Cognitive Function: MMSE - Mini Mental State Exam 05/26/2017  Orientation to time 4  Orientation to Place 5  Registration 3  Attention/ Calculation 5  Recall 2  Language- name 2 objects 2  Language- repeat 1  Language- follow 3 step command 3  Language- read & follow direction 1  Write a sentence 1  Copy design 1  Total score 28     6CIT Screen 06/26/2018  What Year? 0 points  What month? 0 points  What time? 0 points  Count back from 20 0 points  Months in reverse 0 points  Repeat phrase 6 points  Total Score 6   Normal Cognitive Function Screening: Yes    Immunizations and Health Maintenance Immunization History  Administered Date(s) Administered  . Influenza, High Dose Seasonal PF 05/22/2016, 05/21/2017, 05/21/2018  . Influenza,inj,Quad PF,6+ Mos 05/10/2013, 05/05/2014, 05/11/2015  . Pneumococcal Conjugate-13 07/23/2013  . Pneumococcal Polysaccharide-23 11/20/2016  . Tdap 11/23/2013  . Zoster Recombinat (Shingrix) 06/26/2018   Health Maintenance Due  Topic Date Due  . OPHTHALMOLOGY EXAM  06/28/1951   Health Maintenance  Topic Date Due  . OPHTHALMOLOGY EXAM  06/28/1951  . FOOT EXAM  08/22/2018  . HEMOGLOBIN A1C  09/04/2018  . TETANUS/TDAP  11/24/2023  . INFLUENZA VACCINE  Completed  . PNA vac Low Risk Adult  Completed        Assessment:   This is a routine wellness examination for Paul [name].    Plan:    Goals    . Exercise 150 minutes per week (moderate activity)    . Weight (lb) < 190 lb (86.2 kg)        Health Maintenance & Additional Screening Recommendations: Shingrix  Lung: Low Dose CT Chest recommended if Age 51-80 years, 30 pack-year currently smoking OR have quit w/in 15years. Patient does not qualify. Hepatitis C Screening recommended: no  Today's Orders Orders Placed This Encounter  Procedures  .  Varicella-zoster vaccine IM (Shingrix)  -given today  Keep f/u with Dettinger, Fransisca Kaufmann, MD and any other specialty  appointments you may have Continue current medications Move carefully to avoid falls. Use assistive devices like a cane or walker if needed. Aim for at least 150 minutes of moderate activity a week. This can be done with chair exercises if necessary. Read or work on puzzles daily Stay connected with friends and family  I have personally reviewed and noted the following in the patient's chart:   . Medical and social history . Use of alcohol, tobacco or illicit drugs  . Current medications and supplements . Functional ability and status . Nutritional status . Physical activity . Advanced directives . List of other physicians . Hospitalizations, surgeries, and ER visits in previous 12 months . Vitals . Screenings to include cognitive, depression, and falls . Referrals and appointments  In addition, I have reviewed and discussed with patient certain preventive protocols, quality metrics, and best practice recommendations. A written personalized care plan for preventive services as well as general preventive health recommendations were provided to patient.     Chong Sicilian, RN   06/26/2018    I have reviewed and agree with the above AWV documentation.   Evelina Dun, FNP

## 2018-09-03 DIAGNOSIS — H43393 Other vitreous opacities, bilateral: Secondary | ICD-10-CM | POA: Diagnosis not present

## 2018-09-03 DIAGNOSIS — H2513 Age-related nuclear cataract, bilateral: Secondary | ICD-10-CM | POA: Diagnosis not present

## 2018-09-03 DIAGNOSIS — H43812 Vitreous degeneration, left eye: Secondary | ICD-10-CM | POA: Diagnosis not present

## 2018-09-04 ENCOUNTER — Encounter: Payer: Self-pay | Admitting: *Deleted

## 2018-09-04 ENCOUNTER — Encounter: Payer: Self-pay | Admitting: Family Medicine

## 2018-09-04 ENCOUNTER — Ambulatory Visit: Payer: Medicare Other | Admitting: Family Medicine

## 2018-09-04 VITALS — BP 136/75 | HR 64 | Temp 96.8°F | Ht 66.0 in | Wt 190.8 lb

## 2018-09-04 DIAGNOSIS — B351 Tinea unguium: Secondary | ICD-10-CM

## 2018-09-04 DIAGNOSIS — I10 Essential (primary) hypertension: Secondary | ICD-10-CM

## 2018-09-04 DIAGNOSIS — I152 Hypertension secondary to endocrine disorders: Secondary | ICD-10-CM

## 2018-09-04 DIAGNOSIS — E785 Hyperlipidemia, unspecified: Secondary | ICD-10-CM

## 2018-09-04 DIAGNOSIS — E1159 Type 2 diabetes mellitus with other circulatory complications: Secondary | ICD-10-CM | POA: Diagnosis not present

## 2018-09-04 DIAGNOSIS — E1169 Type 2 diabetes mellitus with other specified complication: Secondary | ICD-10-CM | POA: Diagnosis not present

## 2018-09-04 DIAGNOSIS — Z23 Encounter for immunization: Secondary | ICD-10-CM

## 2018-09-04 LAB — BAYER DCA HB A1C WAIVED: HB A1C: 5.9 % (ref <7.0)

## 2018-09-04 MED ORDER — TERBINAFINE HCL 250 MG PO TABS: 250.0000 mg | ORAL_TABLET | Freq: Every day | ORAL | 1 refills | Status: DC

## 2018-09-04 NOTE — Progress Notes (Signed)
BP 136/75   Pulse 64   Temp (!) 96.8 F (36 C) (Oral)   Ht _0  (1.676 m)   Wt 86.5 kg   BMI 30.80 kg/m    Subjective:    Patient ID: Paul [name], male    DOB: [birth year], 78 y.o.   MRN: [id number]  HPI: Paul [name] is a 78 y.o. male presenting on 09/04/2018 for Diabetes (3 month follow up) and Hypertension   HPI Type 2 Diabetes Patient is controlling his diabetes with diet and lifestyle and is not currently taking any medication. He does not check his blood sugar at home. He denies any issues with his feet except onychomycosis which he treats with terbinafine. His Hemoglobin A1c has been 6.1 or below for the last year and we will recheck today.  Patient is on an ACE inhibitor.  Patient has not had an eye exam yet this year  Hypertension Blood pressure today is 136/75. Patient has been controlling his hypertension with Amlodipine and Irbesartan and has not had any issues. He denies headaches, dizziness, chest pain, or recent illness. He has no complaints.   Hyperlipidemia Patient is coming in for recheck of his hyperlipidemia. The patient is currently taking Lipitor. They deny any issues with myalgias or history of liver damage from it. They deny any focal numbness or weakness or chest pain.   Has onychomycosis and still is taking terbinafine for it and it is improving and almost growing out but he will continue the terbinafine.  Relevant past medical, surgical, family and social history reviewed and updated as indicated. Interim medical history since our last visit reviewed. Allergies and medications reviewed and updated.  Review of Systems  Constitutional: Negative for fatigue and fever.  Respiratory: Negative for cough and shortness of breath.   Cardiovascular: Negative for chest pain.  Musculoskeletal: Positive for arthralgias (bilateral knee pain when walking on cement x many years) and back pain (x >5 years, was previously scanned for arthritis).  Neurological:  Negative for dizziness, weakness and numbness.    Per HPI unless specifically indicated above   Allergies as of 09/04/2018      Reactions   Sulfonamide Derivatives    REACTION: rash      Medication List       Accurate as of September 04, 2018  8:56 AM. Always use your most recent med list.        amLODipine 5 MG tablet Commonly known as:  NORVASC Take 1 tablet (5 mg total) by mouth daily.   aspirin 81 MG tablet Take 81 mg by mouth daily.   atorvastatin 40 MG tablet Commonly known as:  LIPITOR Take 1 tablet (40 mg total) by mouth daily.   Blood Glucose Monitor System w/Device Kit Check blood sugar three times a day Dx:  E11.9   carbidopa-levodopa 25-100 MG tablet Commonly known as:  SINEMET IR Take 1 tablet by mouth 3 (three) times daily.   irbesartan 75 MG tablet Commonly known as:  AVAPRO Take 1 tablet (75 mg total) by mouth daily.   pantoprazole 40 MG tablet Commonly known as:  PROTONIX Take 1 tablet (40 mg total) by mouth every other day.   terbinafine 250 MG tablet Commonly known as:  LAMISIL Take 1 tablet (250 mg total) by mouth daily.   Vitamin D3 50 MCG (2000 UT) Tabs Take 2,000 Units by mouth daily.          Objective:    BP 136/75   Pulse  64   Temp (!) 96.8 F (36 C) (Oral)   Ht _0  (1.676 m)   Wt 86.5 kg   BMI 30.80 kg/m     Physical Exam Constitutional:      General: He is not in acute distress.    Appearance: Normal appearance.  Cardiovascular:     Rate and Rhythm: Normal rate and regular rhythm.     Pulses:          Dorsalis pedis pulses are 2+ on the right side and 2+ on the left side.     Heart sounds: Normal heart sounds.  Pulmonary:     Effort: Pulmonary effort is normal.     Breath sounds: Normal breath sounds.  Musculoskeletal:     Right lower leg: No edema.     Left lower leg: No edema.  Feet:     Right foot:     Protective Sensation: 6 sites tested. 6 sites sensed.     Skin integrity: Skin integrity normal. No  ulcer, blister or skin breakdown.     Toenail Condition: Right toenails are abnormally thick. Fungal disease present.    Left foot:     Protective Sensation: 6 sites tested. 6 sites sensed.     Skin integrity: Skin integrity normal. No ulcer, blister or skin breakdown.     Toenail Condition: Left toenails are abnormally thick. Fungal disease present.      Assessment & Plan:   Problem List Items Addressed This Visit      Cardiovascular and Mediastinum   Hypertension associated with diabetes ([place]) (Chronic)     Endocrine   Hyperlipidemia associated with type 2 diabetes mellitus ([place])   Type 2 diabetes mellitus with other specified complication (HCC) - Primary   Relevant Orders   [facility] Hb A1c Waived (Completed)    Other Visit Diagnoses    Onychomycosis       Relevant Medications   terbinafine (LAMISIL) 250 MG tablet   Other Relevant Orders   CBC with Differential/Platelet (Completed)   CMP14+EGFR (Completed)    Continue terbinafine for onychomycosis  Follow up plan: Return in about 6 months (around 03/05/2019), or if symptoms worsen or fail to improve, for Type 2 diabetes and hypertension and cholesterol recheck.  Counseling provided for all of the vaccine components Orders Placed This Encounter  Procedures  . CBC with Differential/Platelet  . CMP14+EGFR  . [facility] Hb A1c Waived    [place], [facility] 09/04/2018, 8:56 AM  Patient seen and examined with PA student, agree with assessment and plan above. Caryl Pina, MD [facility] 09/10/2018, 8:45 PM

## 2018-09-05 LAB — CBC WITH DIFFERENTIAL/PLATELET
BASOS ABS: 0.1 10*3/uL (ref 0.0–0.2)
BASOS: 1 %
EOS (ABSOLUTE): 0.2 10*3/uL (ref 0.0–0.4)
Eos: 3 %
Hematocrit: 44.3 % (ref 37.5–51.0)
Hemoglobin: 14.5 g/dL (ref 13.0–17.7)
IMMATURE GRANS (ABS): 0 10*3/uL (ref 0.0–0.1)
Immature Granulocytes: 0 %
LYMPHS ABS: 2.8 10*3/uL (ref 0.7–3.1)
LYMPHS: 42 %
MCH: 32.2 pg (ref 26.6–33.0)
MCHC: 32.7 g/dL (ref 31.5–35.7)
MCV: 98 fL — AB (ref 79–97)
MONOS ABS: 0.7 10*3/uL (ref 0.1–0.9)
Monocytes: 10 %
NEUTROS ABS: 3 10*3/uL (ref 1.4–7.0)
Neutrophils: 44 %
PLATELETS: 318 10*3/uL (ref 150–450)
RBC: 4.51 x10E6/uL (ref 4.14–5.80)
RDW: 13.9 % (ref 11.6–15.4)
WBC: 6.7 10*3/uL (ref 3.4–10.8)

## 2018-09-05 LAB — CMP14+EGFR
A/G RATIO: 1.9 (ref 1.2–2.2)
ALK PHOS: 109 IU/L (ref 39–117)
ALT: 20 IU/L (ref 0–44)
AST: 20 IU/L (ref 0–40)
Albumin: 4.7 g/dL (ref 3.7–4.7)
BILIRUBIN TOTAL: 0.5 mg/dL (ref 0.0–1.2)
BUN/Creatinine Ratio: 14 (ref 10–24)
BUN: 15 mg/dL (ref 8–27)
CHLORIDE: 104 mmol/L (ref 96–106)
CO2: 22 mmol/L (ref 20–29)
Calcium: 9.8 mg/dL (ref 8.6–10.2)
Creatinine, Ser: 1.1 mg/dL (ref 0.76–1.27)
GFR calc non Af Amer: 64 mL/min/{1.73_m2} (ref >59)
GFR, EST AFRICAN AMERICAN: 74 mL/min/{1.73_m2} (ref >59)
GLUCOSE: 115 mg/dL — AB (ref 65–99)
Globulin, Total: 2.5 g/dL (ref 1.5–4.5)
POTASSIUM: 4.7 mmol/L (ref 3.5–5.2)
Sodium: 142 mmol/L (ref 134–144)
TOTAL PROTEIN: 7.2 g/dL (ref 6.0–8.5)

## 2018-09-09 NOTE — Progress Notes (Signed)
Paul [name] was seen today in the movement disorders clinic for neurologic consultation at the request of [name], Paul Kaufmann, MD.  The consultation is for the evaluation of tremor.  The records that were made available to me were reviewed.   This patient is accompanied in the office by his spouse who supplements the history.    04/10/18 update: Patient is seen today in follow-up for Parkinson's disease which was newly diagnosed last visit.  He was started on carbidopa/levodopa 25/100 and is now on 1 tablet 3 times per day (8:30am/12:30pm/4:30pm).  He has tolerated the medication well.  Wife notices much less shuffling and not stooping as much.  No wearing off of the medication.  No falls.    He denies side effects with medication.  No lightheadedness or near syncope.  No hallucinations.  Records are reviewed since last visit.  Saw his primary care physician on March 04, 2018.  He is not doing exercise but does "work some around the house."  Patient's wife notes noted that he is sleepy during the day if he is not active.  This started prior to starting levodopa.  He does snore at night.  There may be periods of apnea that she notices, but she is not sure if he is just breathing quieter.  He does not act out the dreams.  09/11/18 update:  Pt seen in f/u for parkinson's disease.  Patient's wife accompanies him and supplements the history.  On carbidopa/levodopa 25/100 tid.  Pt denies falls.  Pt denies lightheadedness, near syncope.  No hallucinations.  Mood has been good.  Wife thinks that tremor has been better.  Only exercise has been "chopping up wood."  The records that were made available to me were reviewed.  He saw urology for his hx of prostate CA and stress incontinence.  Reports were positive.    PREVIOUS MEDICATIONS: none to date  ALLERGIES:   Allergies  Allergen Reactions  . Sulfonamide Derivatives     REACTION: rash    CURRENT MEDICATIONS:  Outpatient Encounter Medications as of  09/11/2018  Medication Sig  . amLODipine (NORVASC) 5 MG tablet Take 1 tablet (5 mg total) by mouth daily.  Marland Kitchen aspirin 81 MG tablet Take 81 mg by mouth daily.  Marland Kitchen atorvastatin (LIPITOR) 40 MG tablet Take 1 tablet (40 mg total) by mouth daily.  . Blood Glucose Monitoring Suppl (BLOOD GLUCOSE MONITOR SYSTEM) w/Device KIT Check blood sugar three times a day Dx:  E11.9  . carbidopa-levodopa (SINEMET IR) 25-100 MG tablet Take 1 tablet by mouth 3 (three) times daily.  . Cholecalciferol (VITAMIN D3) 2000 UNITS TABS Take 2,000 Units by mouth daily.  . irbesartan (AVAPRO) 75 MG tablet Take 1 tablet (75 mg total) by mouth daily.  . pantoprazole (PROTONIX) 40 MG tablet Take 1 tablet (40 mg total) by mouth every other day.  . terbinafine (LAMISIL) 250 MG tablet Take 1 tablet (250 mg total) by mouth daily.   No facility-administered encounter medications on file as of 09/11/2018.     PAST MEDICAL HISTORY:   Past Medical History:  Diagnosis Date  . 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 Diabetes mellitus without complication ([place])   . Full dentures    does not wear bottoms  . GERD (gastroesophageal reflux disease)   . Hyperlipidemia   . Hypertension   . Iron deficiency anemia  presumped malabsorption; neg EGD/ capsule endos/colosopcy  . Iron deficiency anemia, unspecified 10/16/2012  . Kidney stones   . Parkinson's disease ([place])     PAST SURGICAL HISTORY:   Past Surgical History:  Procedure Laterality Date  . COLONOSCOPY    . HEMORRHOID SURGERY N/A 07/05/2014   Procedure: HEMORRHOIDECTOMY;  Surgeon: Erroll Luna, MD;  Location: [place];  Service: General;  Laterality: N/A;  . ORIF RADIAL FRACTURE     right-as child  . robtic assisted laparoscopic     radical prostatectomy and bilateral pelvic lymphadenectomy  . UPPER GI ENDOSCOPY      SOCIAL HISTORY:     Social History   Socioeconomic History  . Marital status: Married    Spouse name: Not on file  . Number of children: 1  . Years of education: 42  . Highest education level: 10th grade  Occupational History  . Occupation: retired    Comment: Architect work  Scientific laboratory technician  . Financial resource strain: Not hard at all  . Food insecurity:    Worry: Never true    Inability: Never true  . Transportation needs:    Medical: No    Non-medical: No  Tobacco Use  . Smoking status: Former Smoker    Packs/day: 1.00    Years: 25.00    Pack years: 25.00    Last attempt to quit: 09/18/1981    Years since quitting: 37.0  . Smokeless tobacco: Never Used  Substance and Sexual Activity  . Alcohol use: No  . Drug use: No  . Sexual activity: Yes  Lifestyle  . Physical activity:    Days per week: 0 days    Minutes per session: 0 min  . Stress: Only a little  Relationships  . Social connections:    Talks on phone: More than three times a week    Gets together: More than three times a week    Attends religious service: More than 4 times per year    Active member of club or organization: No    Attends meetings of clubs or organizations: Never    Relationship status: Married  . Intimate partner violence:    Fear of current or ex partner: No    Emotionally abused: No    Physically abused: No    Forced sexual activity: No  Other Topics Concern  . Not on file  Social History Narrative   06/26/18 Paul [name] lives at home with his wife of 38 years. 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 His son and 3 grandchildren live down the road. His son's family recently moved into a new home after living with Paul [name] and his wife for the past 2 years. They still see them often. He and his wife area active in church and have friends that they eat out with often    FAMILY HISTORY:   Family Status  Relation Name Status  . Mother  Deceased at age 43   . Father  Deceased at age 43       heart attack  . Brother  Deceased       age 84 stents  . Sister  Deceased  . Sister  Alive  . Sister  Alive  . Brother  Deceased  . Brother  Alive  . Brother  Alive  . Son  Alive    ROS:  Review of Systems  Constitutional: Negative.   HENT:  Negative.   Eyes: Negative.   Respiratory: Negative.   Cardiovascular: Negative.   Gastrointestinal: Negative.   Skin: Negative.      PHYSICAL EXAMINATION:    VITALS:   Vitals:   09/11/18 1029  BP: 110/80  Pulse: 68  SpO2: 91%  Weight: 193 lb (87.5 kg)  Height: 5' 8" (1.727 m)    GEN:  The patient appears stated age and is in NAD. HEENT:  Normocephalic, atraumatic.  The mucous membranes are moist. The superficial temporal arteries are without ropiness or tenderness. CV:  RRR Lungs:  CTAB Neck/HEME:  There are no carotid bruits bilaterally.  Neurological examination:  Orientation: The patient is alert and oriented x3. Cranial nerves: There is good facial symmetry. The speech is fluent and clear. Soft palate rises symmetrically and there is no tongue deviation. Hearing is intact to conversational tone. Sensation: Sensation is intact to light touch throughout Motor: Strength is 5/5 in the bilateral upper and lower extremities.   Shoulder shrug is equal and symmetric.  There is no pronator drift.   Movement examination: Tone: There is normal tone in the upper and lower extremities. Abnormal movements: There is rare and intermittent right upper extremity resting tremor. Coordination:  There is no decremation, with any form of RAMS, including alternating supination and pronation of the forearm, hand opening and closing, finger taps, heel taps and toe taps. Gait and Station: The patient has no difficulty arising out of a deep-seated chair without the use of the hands. The patient's stride length is good with slightly reduced arm swing    Labs: Lab Results  Component Value Date   TSH 1.89  12/03/2017     Chemistry      Component Value Date/Time   NA 142 09/04/2018 0920   NA 141 04/06/2013 0956   K 4.7 09/04/2018 0920   K 4.1 04/06/2013 0956   CL 104 09/04/2018 0920   CL 106 10/16/2012 1022   CO2 22 09/04/2018 0920   CO2 24 04/06/2013 0956   BUN 15 09/04/2018 0920   BUN 16.7 04/06/2013 0956   CREATININE 1.10 09/04/2018 0920   CREATININE 1.0 04/06/2013 0956      Component Value Date/Time   CALCIUM 9.8 09/04/2018 0920   CALCIUM 9.7 04/06/2013 0956   ALKPHOS 109 09/04/2018 0920   ALKPHOS 101 04/06/2013 0956   AST 20 09/04/2018 0920   AST 24 04/06/2013 0956   ALT 20 09/04/2018 0920   ALT 23 04/06/2013 0956   BILITOT 0.5 09/04/2018 0920   BILITOT 0.34 04/06/2013 0956       ASSESSMENT/PLAN:  1.  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  The patient has tremor, bradykinesia, rigidity  -We discussed the diagnosis as well as pathophysiology of the disease.  We discussed treatment options as well as prognostic indicators.  Patient education was provided.  -continue carbidopa/levodopa 25/100 tid.    2.  EDS  -sleep apnea in the Ddx.  Discussed morbidity and mortality associated with sleep apnea.  I would recommend a nocturnal polysomnogram.  Ultimately, they declined.  He was offered one in the home but they declined that as well.  They will let me know if they change their mind.  3.  I will see the patient back in 6 months, sooner should new neurologic issues arise.  Cc:  [name], Paul Kaufmann, MD

## 2018-09-10 ENCOUNTER — Ambulatory Visit: Payer: Medicare Other | Admitting: Neurology

## 2018-09-11 ENCOUNTER — Encounter: Payer: Self-pay | Admitting: Neurology

## 2018-09-11 ENCOUNTER — Ambulatory Visit (INDEPENDENT_AMBULATORY_CARE_PROVIDER_SITE_OTHER): Payer: Medicare Other | Admitting: Neurology

## 2018-09-11 VITALS — BP 110/80 | HR 68 | Ht 68.0 in | Wt 193.0 lb

## 2018-09-11 DIAGNOSIS — G20 Parkinson's disease: Secondary | ICD-10-CM

## 2018-09-11 NOTE — Patient Instructions (Signed)
You are looking well!  The physicians and staff at [facility] are committed to providing excellent care. You may receive a survey requesting feedback about your experience at our office. We strive to receive "very good" responses to the survey questions. If you feel that your experience would prevent you from giving the office a "very good " response, please contact our office to try to remedy the situation. We may be reached at [phone]. Thank you for taking the time out of your busy day to complete the survey.

## 2018-12-04 ENCOUNTER — Other Ambulatory Visit: Payer: Self-pay | Admitting: Family Medicine

## 2018-12-04 ENCOUNTER — Other Ambulatory Visit: Payer: Self-pay | Admitting: Neurology

## 2018-12-04 DIAGNOSIS — I10 Essential (primary) hypertension: Principal | ICD-10-CM

## 2018-12-04 DIAGNOSIS — E1159 Type 2 diabetes mellitus with other circulatory complications: Secondary | ICD-10-CM

## 2018-12-04 DIAGNOSIS — E785 Hyperlipidemia, unspecified: Secondary | ICD-10-CM

## 2018-12-04 DIAGNOSIS — E1169 Type 2 diabetes mellitus with other specified complication: Secondary | ICD-10-CM

## 2018-12-04 DIAGNOSIS — I152 Hypertension secondary to endocrine disorders: Secondary | ICD-10-CM

## 2019-02-08 NOTE — Progress Notes (Signed)
    Virtual Visit via Telephone Note The purpose of this virtual visit is to provide medical care while limiting exposure to the novel coronavirus.    Consent was obtained for phone visit:  Yes.   Answered questions that patient had about telehealth interaction:  Yes.   I discussed the limitations, risks, security and privacy concerns of performing an evaluation and management service by telephone. I also discussed with the patient that there may be a patient responsible charge related to this service. The patient expressed understanding and agreed to proceed.  Pt location: Home Physician Location: home Name of referring provider:  Dettinger, Fransisca Kaufmann, MD I connected with .Paul [name] at patients initiation/request on 02/10/2019 at 11:15 AM EDT by telephone and verified that I am speaking with the correct person using two identifiers.  Pt MRN:  [id number] Pt DOB:  [birth year]   History of Present Illness:  Patient seen today in follow-up for Parkinson's disease.  Patient is on carbidopa/levodopa 25/100, 1 tablet 3 times per day (8:30/12:30/4:30).  He has no SE.     Pt denies falls.  Pt denies lightheadedness, near syncope.  No hallucinations.  Mood has been good.   Observations/Objective:   Vitals:   02/10/19 1019  Weight: 190 lb (86.2 kg)  Height: 5\' 7"  (1.702 m)     Assessment and Plan:   1.  Parkinson's disease, diagnosed May, 2019  -Continue carbidopa/levodopa 25/100, 1 tablet 3 times per day  -exercise  2.  Excessive daytime hypersomnolence  -Refuses nocturnal polysomnogram.  Understands morbidity and mortality associated with sleep apnea.  Follow Up Instructions:    -I discussed the assessment and treatment plan with the patient. The patient was provided an opportunity to ask questions and all were answered. The patient agreed with the plan and demonstrated an understanding of the instructions.   The patient was advised to call back or seek an in-person evaluation if  the symptoms worsen or if the condition fails to improve as anticipated.    Total Time spent in visit with the patient was:  5 min, of which 100% of the time was spent in counseling.   Pt understands and agrees with the plan of care outlined.     Alonza Bogus, DO

## 2019-02-10 ENCOUNTER — Telehealth (INDEPENDENT_AMBULATORY_CARE_PROVIDER_SITE_OTHER): Payer: Medicare Other | Admitting: Neurology

## 2019-02-10 ENCOUNTER — Encounter: Payer: Self-pay | Admitting: Neurology

## 2019-02-10 ENCOUNTER — Other Ambulatory Visit: Payer: Self-pay

## 2019-02-10 VITALS — Ht 67.0 in | Wt 190.0 lb

## 2019-02-10 DIAGNOSIS — G20 Parkinson's disease: Secondary | ICD-10-CM

## 2019-02-10 MED ORDER — CARBIDOPA-LEVODOPA 25-100 MG PO TABS: 1.0000 | ORAL_TABLET | Freq: Three times a day (TID) | ORAL | 1 refills | Status: DC

## 2019-03-05 ENCOUNTER — Encounter: Payer: Self-pay | Admitting: Family Medicine

## 2019-03-05 ENCOUNTER — Ambulatory Visit (INDEPENDENT_AMBULATORY_CARE_PROVIDER_SITE_OTHER): Payer: Medicare Other | Admitting: Family Medicine

## 2019-03-05 VITALS — BP 126/69 | HR 60

## 2019-03-05 DIAGNOSIS — I10 Essential (primary) hypertension: Secondary | ICD-10-CM

## 2019-03-05 DIAGNOSIS — E1169 Type 2 diabetes mellitus with other specified complication: Secondary | ICD-10-CM | POA: Diagnosis not present

## 2019-03-05 DIAGNOSIS — E785 Hyperlipidemia, unspecified: Secondary | ICD-10-CM

## 2019-03-05 DIAGNOSIS — I152 Hypertension secondary to endocrine disorders: Secondary | ICD-10-CM

## 2019-03-05 DIAGNOSIS — K219 Gastro-esophageal reflux disease without esophagitis: Secondary | ICD-10-CM | POA: Diagnosis not present

## 2019-03-05 DIAGNOSIS — E1159 Type 2 diabetes mellitus with other circulatory complications: Secondary | ICD-10-CM | POA: Diagnosis not present

## 2019-03-05 MED ORDER — PANTOPRAZOLE SODIUM 40 MG PO TBEC: 40.0000 mg | DELAYED_RELEASE_TABLET | ORAL | 3 refills | Status: DC

## 2019-03-05 MED ORDER — ATORVASTATIN CALCIUM 40 MG PO TABS: 40.0000 mg | ORAL_TABLET | Freq: Every day | ORAL | 3 refills | Status: DC

## 2019-03-05 MED ORDER — IRBESARTAN 75 MG PO TABS: 75.0000 mg | ORAL_TABLET | Freq: Every day | ORAL | 3 refills | Status: DC

## 2019-03-05 MED ORDER — AMLODIPINE BESYLATE 5 MG PO TABS: 5.0000 mg | ORAL_TABLET | Freq: Every day | ORAL | 3 refills | Status: DC

## 2019-03-05 NOTE — Progress Notes (Signed)
Virtual Visit via telephone Note  I connected with Paul [name] on 03/05/19 at [phone] by telephone and verified that I am speaking with the correct person using two identifiers. NOX TALENT is currently located at home and wife  are currently with her during visit. The provider, Fransisca Kaufmann Thera Basden, MD is located in their office at time of visit.  Call ended at [phone]  I discussed the limitations, risks, security and privacy concerns of performing an evaluation and management service by telephone and the availability of in person appointments. I also discussed with the patient that there may be a patient responsible charge related to this service. The patient expressed understanding and agreed to proceed.   History and Present Illness: Type 2 diabetes mellitus Patient comes in today for recheck of his diabetes. Patient has been currently taking no medication and diet controlled. Patient is currently on an ACE inhibitor/ARB. Patient has not seen an ophthalmologist this year. Patient denies any issues with their feet.   Hypertension Patient is currently on amlodipine and irbesartan, and their blood pressure today is 126/69 hr 60 at home checks. Patient denies any lightheadedness or dizziness. Patient denies headaches, blurred vision, chest pains, shortness of breath, or weakness. Denies any side effects from medication and is content with current medication.   Hyperlipidemia Patient is coming in for recheck of his hyperlipidemia. The patient is currently taking atorvastatin. They deny any issues with myalgias or history of liver damage from it. They deny any focal numbness or weakness or chest pain.   GERD Patient is currently on pantaprazole.  She denies any major symptoms or abdominal pain or belching or burping. She denies any blood in her stool or lightheadedness or dizziness.   No diagnosis found.  Outpatient Encounter Medications as of 03/05/2019  Medication Sig  . amLODipine  (NORVASC) 5 MG tablet Take 1 tablet by mouth once daily  . aspirin 81 MG tablet Take 81 mg by mouth daily.  Marland Kitchen atorvastatin (LIPITOR) 40 MG tablet Take 1 tablet by mouth once daily  . Blood Glucose Monitoring Suppl (BLOOD GLUCOSE MONITOR SYSTEM) w/Device KIT Check blood sugar three times a day Dx:  E11.9  . carbidopa-levodopa (SINEMET IR) 25-100 MG tablet Take 1 tablet by mouth 3 (three) times daily.  . Cholecalciferol (VITAMIN D3) 2000 UNITS TABS Take 2,000 Units by mouth daily.  . irbesartan (AVAPRO) 75 MG tablet Take 1 tablet by mouth once daily  . pantoprazole (PROTONIX) 40 MG tablet Take 1 tablet (40 mg total) by mouth every other day.  . terbinafine (LAMISIL) 250 MG tablet Take 1 tablet (250 mg total) by mouth daily.   No facility-administered encounter medications on file as of 03/05/2019.     Review of Systems  Constitutional: Negative for chills and fever.  Eyes: Negative for visual disturbance.  Respiratory: Negative for shortness of breath and wheezing.   Cardiovascular: Negative for chest pain and leg swelling.  Gastrointestinal: Negative for abdominal pain.  Musculoskeletal: Negative for back pain and gait problem.  Skin: Negative for rash.  Neurological: Negative for dizziness, weakness and numbness.  All other systems reviewed and are negative.   Observations/Objective: Patient sounds comfortable and no acute distress  Assessment and Plan: Problem List Items Addressed This Visit      Cardiovascular and Mediastinum   Hypertension associated with diabetes ([place]) (Chronic)   Relevant Medications   amLODipine (NORVASC) 5 MG tablet   atorvastatin (LIPITOR) 40 MG tablet   irbesartan (AVAPRO) 75 MG  tablet     Digestive   GERD (gastroesophageal reflux disease)   Relevant Medications   pantoprazole (PROTONIX) 40 MG tablet     Endocrine   Hyperlipidemia associated with type 2 diabetes mellitus (HCC)   Relevant Medications   atorvastatin (LIPITOR) 40 MG tablet    irbesartan (AVAPRO) 75 MG tablet   Type 2 diabetes mellitus with other specified complication (HCC) - Primary   Relevant Medications   atorvastatin (LIPITOR) 40 MG tablet   irbesartan (AVAPRO) 75 MG tablet       Follow Up Instructions: Follow up in 3 months for diabetes a  No change in medications    I discussed the assessment and treatment plan with the patient. The patient was provided an opportunity to ask questions and all were answered. The patient agreed with the plan and demonstrated an understanding of the instructions.   The patient was advised to call back or seek an in-person evaluation if the symptoms worsen or if the condition fails to improve as anticipated.  The above assessment and management plan was discussed with the patient. The patient verbalized understanding of and has agreed to the management plan. Patient is aware to call the clinic if symptoms persist or worsen. Patient is aware when to return to the clinic for a follow-up visit. Patient educated on when it is appropriate to go to the emergency department.    I provided 7 minutes of non-face-to-face time during this encounter.    Worthy Rancher, MD

## 2019-03-08 ENCOUNTER — Other Ambulatory Visit: Payer: Self-pay | Admitting: Neurology

## 2019-03-08 ENCOUNTER — Ambulatory Visit: Payer: Medicare Other | Admitting: Family Medicine

## 2019-03-08 NOTE — Telephone Encounter (Signed)
Requested Prescriptions   Pending Prescriptions Disp Refills  . carbidopa-levodopa (SINEMET IR) 25-100 MG tablet [Pharmacy Med Name: Carbidopa-Levodopa 25-100 MG Oral Tablet] 270 tablet 0    Sig: TAKE 1 TABLET BY MOUTH THREE TIMES DAILY   Rx last filled: 02/10/19 #270 1 refills  Pt last seen: 02/10/19   Follow up appt scheduled:none Denied request to soon

## 2019-03-11 ENCOUNTER — Other Ambulatory Visit: Payer: Self-pay | Admitting: Neurology

## 2019-03-11 NOTE — Telephone Encounter (Signed)
Requested Prescriptions   Pending Prescriptions Disp Refills  . carbidopa-levodopa (SINEMET IR) 25-100 MG tablet [Pharmacy Med Name: Carbidopa-Levodopa 25-100 MG Oral Tablet] 270 tablet 0    Sig: TAKE 1 TABLET BY MOUTH THREE TIMES DAILY   Rx last filled: 02/10/19 #270 1 refills  Pt last seen: 02/10/19   Follow up appt scheduled: none  Denied request to soon

## 2019-05-10 DIAGNOSIS — C61 Malignant neoplasm of prostate: Secondary | ICD-10-CM | POA: Diagnosis not present

## 2019-05-10 DIAGNOSIS — G20 Parkinson's disease: Secondary | ICD-10-CM | POA: Diagnosis not present

## 2019-05-10 DIAGNOSIS — N200 Calculus of kidney: Secondary | ICD-10-CM | POA: Diagnosis not present

## 2019-05-10 DIAGNOSIS — Z8546 Personal history of malignant neoplasm of prostate: Secondary | ICD-10-CM | POA: Diagnosis not present

## 2019-05-31 ENCOUNTER — Other Ambulatory Visit: Payer: Self-pay

## 2019-06-01 ENCOUNTER — Ambulatory Visit (INDEPENDENT_AMBULATORY_CARE_PROVIDER_SITE_OTHER): Payer: Medicare Other

## 2019-06-01 DIAGNOSIS — Z23 Encounter for immunization: Secondary | ICD-10-CM | POA: Diagnosis not present

## 2019-06-01 NOTE — Progress Notes (Deleted)
Paul [name] was seen today in follow up for Parkinsons disease.   Pt denies falls.  Pt denies lightheadedness, near syncope.  No hallucinations.  Mood has been good.  Medical records have been reviewed since our last visit.  Saw the urologist on October 5 for his history of prostate cancer.  Current prescribed movement disorder medications: Carbidopa/levodopa 25/100, 1 tablet 3 times per day   PREVIOUS MEDICATIONS: none to date  ALLERGIES:   Allergies  Allergen Reactions  . Sulfonamide Derivatives     REACTION: rash    CURRENT MEDICATIONS:  Outpatient Encounter Medications as of 06/03/2019  Medication Sig  . amLODipine (NORVASC) 5 MG tablet Take 1 tablet (5 mg total) by mouth daily.  Marland Kitchen aspirin 81 MG tablet Take 81 mg by mouth daily.  Marland Kitchen atorvastatin (LIPITOR) 40 MG tablet Take 1 tablet (40 mg total) by mouth daily.  . Blood Glucose Monitoring Suppl (BLOOD GLUCOSE MONITOR SYSTEM) w/Device KIT Check blood sugar three times a day Dx:  E11.9  . carbidopa-levodopa (SINEMET IR) 25-100 MG tablet Take 1 tablet by mouth 3 (three) times daily.  . Cholecalciferol (VITAMIN D3) 2000 UNITS TABS Take 2,000 Units by mouth daily.  . irbesartan (AVAPRO) 75 MG tablet Take 1 tablet (75 mg total) by mouth daily.  . pantoprazole (PROTONIX) 40 MG tablet Take 1 tablet (40 mg total) by mouth every other day.   No facility-administered encounter medications on file as of 06/03/2019.     PAST MEDICAL HISTORY:   Past Medical History:  Diagnosis Date  . 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 Diabetes mellitus without complication ([place])   . Full dentures    does not wear bottoms  . GERD (gastroesophageal reflux disease)   . Hyperlipidemia   . Hypertension   . Iron deficiency anemia    presumped malabsorption; neg EGD/ capsule endos/colosopcy  . Iron deficiency anemia, unspecified  10/16/2012  . Kidney stones   . Parkinson's disease ([place])     PAST SURGICAL HISTORY:   Past Surgical History:  Procedure Laterality Date  . COLONOSCOPY    . HEMORRHOID SURGERY N/A 07/05/2014   Procedure: HEMORRHOIDECTOMY;  Surgeon: Erroll Luna, MD;  Location: [place];  Service: General;  Laterality: N/A;  . ORIF RADIAL FRACTURE     right-as child  . robtic assisted laparoscopic     radical prostatectomy and bilateral pelvic lymphadenectomy  . UPPER GI ENDOSCOPY      SOCIAL HISTORY:   Social History   Socioeconomic History  . Marital status: Married    Spouse name: Not on file  . Number of children: 1  . Years of education: 33  . Highest education level: 10th grade  Occupational History  . Occupation: retired    Comment: Architect work  Scientific laboratory technician  . Financial resource strain: Not hard at all  . Food insecurity    Worry: Never true    Inability: Never true  . Transportation needs    Medical: No    Non-medical: No  Tobacco Use  . Smoking status: Former Smoker    Packs/day: 1.00    Years: 25.00    Pack years: 25.00    Quit date: 09/18/1981    Years since quitting: 37.7  . Smokeless tobacco: Never Used  Substance and Sexual Activity  . Alcohol use: No  . Drug use: No  .  Sexual activity: Yes  Lifestyle  . Physical activity    Days per week: 0 days    Minutes per session: 0 min  . Stress: Only a little  Relationships  . Social connections    Talks on phone: More than three times a week    Gets together: More than three times a week    Attends religious service: More than 4 times per year    Active member of club or organization: No    Attends meetings of clubs or organizations: Never    Relationship status: Married  . Intimate partner violence    Fear of current or ex partner: No    Emotionally abused: No    Physically abused: No    Forced sexual activity: No  Other Topics Concern  . Not on file  Social History Narrative    06/26/18 Mr Greenblatt lives at home with his wife of 33 years. 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 His son and 3 grandchildren live down the road. His son's family recently moved into a new home after living with Mr Delone and his wife for the past 2 years. They still see them often. He and his wife area active in church and have friends that they eat out with often    FAMILY HISTORY:   Family Status  Relation Name Status  . Mother  Deceased at age 81  . Father  Deceased at age 43       heart attack  . Brother  Deceased       age 73 stents  . Sister  Deceased  . Sister  Alive  . Sister  Deceased  . Brother  Deceased  . Brother  Alive  . Brother  Alive  . Son  Alive    ROS:  ROS  PHYSICAL EXAMINATION:    VITALS:  There were no vitals filed for this visit.  GEN:  The patient appears stated age and is in NAD. HEENT:  Normocephalic, atraumatic.  The mucous membranes are moist. The superficial temporal arteries are without ropiness or tenderness. CV:  RRR Lungs:  CTAB Neck/HEME:  There are no carotid bruits bilaterally.  Neurological examination:  Orientation: The patient is alert and oriented x3. Cranial nerves: There is good facial symmetry with*** facial hypomimia. The speech is fluent and clear. Soft palate rises symmetrically and there is no tongue deviation. Hearing is intact to conversational tone. Sensation: Sensation is intact to light touch throughout Motor: Strength is at least antigravity x4.  Movement examination: Tone: There is ***tone in the RUE.  There is ***tone in the LUE.  There is ***tone in the RLE.  There is ***tone in the LLE.  Abnormal movements: *** Coordination:  There is *** decremation with RAM's, *** Gait and Station: The patient has *** difficulty arising out of a deep-seated chair without the use of the hands. The patient's stride length is ***.  The patient has a *** pull test.       ASSESSMENT/PLAN:  ***1.  Parkinson's disease, diagnosed May, 2019  -Continue carbidopa/levodopa 25/100, 1 tablet 3 times per day. 2.  EDS  -Declines PSG.  Cc:  Dettinger, Fransisca Kaufmann, MD

## 2019-06-03 ENCOUNTER — Ambulatory Visit: Payer: Medicare Other | Admitting: Neurology

## 2019-06-10 ENCOUNTER — Ambulatory Visit (INDEPENDENT_AMBULATORY_CARE_PROVIDER_SITE_OTHER): Payer: Medicare Other | Admitting: Family Medicine

## 2019-06-10 ENCOUNTER — Encounter: Payer: Self-pay | Admitting: Family Medicine

## 2019-06-10 DIAGNOSIS — E1159 Type 2 diabetes mellitus with other circulatory complications: Secondary | ICD-10-CM

## 2019-06-10 DIAGNOSIS — K219 Gastro-esophageal reflux disease without esophagitis: Secondary | ICD-10-CM | POA: Diagnosis not present

## 2019-06-10 DIAGNOSIS — I10 Essential (primary) hypertension: Secondary | ICD-10-CM | POA: Diagnosis not present

## 2019-06-10 DIAGNOSIS — I152 Hypertension secondary to endocrine disorders: Secondary | ICD-10-CM

## 2019-06-10 DIAGNOSIS — E1169 Type 2 diabetes mellitus with other specified complication: Secondary | ICD-10-CM | POA: Diagnosis not present

## 2019-06-10 DIAGNOSIS — E785 Hyperlipidemia, unspecified: Secondary | ICD-10-CM

## 2019-06-10 NOTE — Progress Notes (Signed)
Virtual Visit via telephone Note  I connected with Paul [name] on 06/10/19 at [phone] by telephone and verified that I am speaking with the correct person using two identifiers. Paul [name] is currently located at home and wife are currently with her during visit. The provider, Fransisca Kaufmann Mikaili Flippin, MD is located in their office at time of visit.  Call ended at 0826  I discussed the limitations, risks, security and privacy concerns of performing an evaluation and management service by telephone and the availability of in person appointments. I also discussed with the patient that there may be a patient responsible charge related to this service. The patient expressed understanding and agreed to proceed.   History and Present Illness: Type 2 diabetes mellitus Patient comes in today for recheck of his diabetes. Patient has been currently taking no medication and has been feeling good. Patient is currently on an ACE inhibitor/ARB. Patient has not seen an ophthalmologist this year. Patient denies any issues with their feet.   Hypertension Patient is currently on amlodipine and irbesartan, and their blood pressure today is 130/70. Patient denies any lightheadedness or dizziness. Patient denies headaches, blurred vision, chest pains, shortness of breath, or weakness. Denies any side effects from medication and is content with current medication.   Hyperlipidemia Patient is coming in for recheck of his hyperlipidemia. The patient is currently taking atorvastatin. They deny any issues with myalgias or history of liver damage from it. They deny any focal numbness or weakness or chest pain.   GERD Patient is currently on pantaprazole.  She denies any major symptoms or abdominal pain or belching or burping. She denies any blood in her stool or lightheadedness or dizziness.   No diagnosis found.  Outpatient Encounter Medications as of 06/10/2019  Medication Sig  . amLODipine (NORVASC) 5 MG tablet  Take 1 tablet (5 mg total) by mouth daily.  Marland Kitchen aspirin 81 MG tablet Take 81 mg by mouth daily.  Marland Kitchen atorvastatin (LIPITOR) 40 MG tablet Take 1 tablet (40 mg total) by mouth daily.  . Blood Glucose Monitoring Suppl (BLOOD GLUCOSE MONITOR SYSTEM) w/Device KIT Check blood sugar three times a day Dx:  E11.9  . carbidopa-levodopa (SINEMET IR) 25-100 MG tablet Take 1 tablet by mouth 3 (three) times daily.  . Cholecalciferol (VITAMIN D3) 2000 UNITS TABS Take 2,000 Units by mouth daily.  . irbesartan (AVAPRO) 75 MG tablet Take 1 tablet (75 mg total) by mouth daily.  . pantoprazole (PROTONIX) 40 MG tablet Take 1 tablet (40 mg total) by mouth every other day.   No facility-administered encounter medications on file as of 06/10/2019.     Review of Systems  Constitutional: Negative for chills and fever.  Eyes: Negative for visual disturbance.  Respiratory: Negative for shortness of breath and wheezing.   Cardiovascular: Negative for chest pain and leg swelling.  Musculoskeletal: Negative for back pain and gait problem.  Skin: Negative for rash.  Neurological: Negative for dizziness, weakness and light-headedness.  All other systems reviewed and are negative.   Observations/Objective: Patient sounds comfortable and in no acute distress  Assessment and Plan: Problem List Items Addressed This Visit      Cardiovascular and Mediastinum   Hypertension associated with diabetes ([place]) (Chronic)   Relevant Orders   CMP14+EGFR     Digestive   GERD (gastroesophageal reflux disease)   Relevant Orders   CBC with Differential/Platelet     Endocrine   Hyperlipidemia associated with type 2 diabetes mellitus ([place])  Relevant Orders   Lipid panel   Type 2 diabetes mellitus with other specified complication [facility]) - Primary   Relevant Orders   hgba1c       Follow Up Instructions: Follow up in 3 months for diabetes.    I discussed the assessment and treatment plan with the patient. The patient was  provided an opportunity to ask questions and all were answered. The patient agreed with the plan and demonstrated an understanding of the instructions.   The patient was advised to call back or seek an in-person evaluation if the symptoms worsen or if the condition fails to improve as anticipated.  The above assessment and management plan was discussed with the patient. The patient verbalized understanding of and has agreed to the management plan. Patient is aware to call the clinic if symptoms persist or worsen. Patient is aware when to return to the clinic for a follow-up visit. Patient educated on when it is appropriate to go to the emergency department.    I provided 9 minutes of non-face-to-face time during this encounter.    Worthy Rancher, MD

## 2019-06-15 ENCOUNTER — Other Ambulatory Visit: Payer: Self-pay

## 2019-06-15 ENCOUNTER — Other Ambulatory Visit: Payer: Medicare Other

## 2019-06-15 DIAGNOSIS — E1159 Type 2 diabetes mellitus with other circulatory complications: Secondary | ICD-10-CM | POA: Diagnosis not present

## 2019-06-15 DIAGNOSIS — I10 Essential (primary) hypertension: Secondary | ICD-10-CM | POA: Diagnosis not present

## 2019-06-15 DIAGNOSIS — I152 Hypertension secondary to endocrine disorders: Secondary | ICD-10-CM

## 2019-06-15 DIAGNOSIS — K219 Gastro-esophageal reflux disease without esophagitis: Secondary | ICD-10-CM

## 2019-06-15 DIAGNOSIS — E785 Hyperlipidemia, unspecified: Secondary | ICD-10-CM | POA: Diagnosis not present

## 2019-06-15 DIAGNOSIS — E1169 Type 2 diabetes mellitus with other specified complication: Secondary | ICD-10-CM | POA: Diagnosis not present

## 2019-06-15 LAB — BAYER DCA HB A1C WAIVED: HB A1C (BAYER DCA - WAIVED): 6.3 % (ref <7.0)

## 2019-06-16 LAB — LIPID PANEL
Chol/HDL Ratio: 3 ratio (ref 0.0–5.0)
Cholesterol, Total: 177 mg/dL (ref 100–199)
HDL: 59 mg/dL (ref >39)
LDL Chol Calc (NIH): 91 mg/dL (ref 0–99)
Triglycerides: 154 mg/dL — ABNORMAL HIGH (ref 0–149)
VLDL Cholesterol Cal: 27 mg/dL (ref 5–40)

## 2019-06-16 LAB — CBC WITH DIFFERENTIAL/PLATELET
Basophils Absolute: 0.1 10*3/uL (ref 0.0–0.2)
Basos: 1 %
EOS (ABSOLUTE): 0.2 10*3/uL (ref 0.0–0.4)
Eos: 3 %
Hematocrit: 44.7 % (ref 37.5–51.0)
Hemoglobin: 15 g/dL (ref 13.0–17.7)
Immature Grans (Abs): 0 10*3/uL (ref 0.0–0.1)
Immature Granulocytes: 0 %
Lymphocytes Absolute: 4 10*3/uL — ABNORMAL HIGH (ref 0.7–3.1)
Lymphs: 46 %
MCH: 32 pg (ref 26.6–33.0)
MCHC: 33.6 g/dL (ref 31.5–35.7)
MCV: 95 fL (ref 79–97)
Monocytes Absolute: 0.7 10*3/uL (ref 0.1–0.9)
Monocytes: 9 %
Neutrophils Absolute: 3.5 10*3/uL (ref 1.4–7.0)
Neutrophils: 41 %
Platelets: 329 10*3/uL (ref 150–450)
RBC: 4.69 x10E6/uL (ref 4.14–5.80)
RDW: 13 % (ref 11.6–15.4)
WBC: 8.5 10*3/uL (ref 3.4–10.8)

## 2019-06-16 LAB — CMP14+EGFR
ALT: 20 IU/L (ref 0–44)
Albumin/Globulin Ratio: 1.5 (ref 1.2–2.2)
Albumin: 4.5 g/dL (ref 3.7–4.7)
Alkaline Phosphatase: 116 IU/L (ref 39–117)
BUN/Creatinine Ratio: 14 (ref 10–24)
BUN: 15 mg/dL (ref 8–27)
Bilirubin Total: 0.7 mg/dL (ref 0.0–1.2)
CO2: 22 mmol/L (ref 20–29)
Calcium: 9.6 mg/dL (ref 8.6–10.2)
Chloride: 101 mmol/L (ref 96–106)
Creatinine, Ser: 1.05 mg/dL (ref 0.76–1.27)
GFR calc Af Amer: 79 mL/min/{1.73_m2} (ref >59)
GFR calc non Af Amer: 68 mL/min/{1.73_m2} (ref >59)
Globulin, Total: 3 g/dL (ref 1.5–4.5)
Glucose: 113 mg/dL — ABNORMAL HIGH (ref 65–99)
Sodium: 139 mmol/L (ref 134–144)
Total Protein: 7.5 g/dL (ref 6.0–8.5)

## 2019-06-28 ENCOUNTER — Other Ambulatory Visit: Payer: Self-pay

## 2019-06-28 ENCOUNTER — Ambulatory Visit (INDEPENDENT_AMBULATORY_CARE_PROVIDER_SITE_OTHER): Payer: Medicare Other | Admitting: *Deleted

## 2019-06-28 DIAGNOSIS — Z0000 Encounter for general adult medical examination without abnormal findings: Secondary | ICD-10-CM | POA: Diagnosis not present

## 2019-06-28 NOTE — Progress Notes (Signed)
MEDICARE ANNUAL WELLNESS VISIT  06/28/2019  Telephone Visit Disclaimer This Medicare AWV was conducted by telephone due to national recommendations for restrictions regarding the COVID-19 Pandemic (e.g. social distancing).  I verified, using two identifiers, that I am speaking with Paul [name] or their authorized healthcare agent. I discussed the limitations, risks, security, and privacy concerns of performing an evaluation and management service by telephone and the potential availability of an in-person appointment in the future. The patient expressed understanding and agreed to proceed.   Subjective:  Paul [name] is a 78 y.o. male patient of Dettinger, Fransisca Kaufmann, MD who had a Medicare Annual Wellness Visit today via telephone. Paul [name] is Retired and lives with their spouse.He has 1 son and 3 grandchildren.He reports that he is socially active and does interact with friends/family regularly. He is minimally physically active and enjoys gardening.  Patient Care Team: Dettinger, Fransisca Kaufmann, MD as PCP - General (Family Medicine) Gearlean Alf, PA-C as Physician Assistant (Physician Assistant) Chipper Herb, MD (Inactive) as Attending Physician (Family Medicine) Myrlene Broker, MD as Attending Physician (Urology) Tat, Eustace Quail, DO as Consulting Physician (Neurology)  Advanced Directives 06/28/2019 02/10/2019 05/26/2017 12/08/2014 07/05/2014 06/27/2014  Does Patient Have a Medical Advance Directive? Yes Yes Yes No No No  Type of Paramedic of [place];Living will Living will;Healthcare Power of [place];Living will - - -  Copy of [place] in Chart? No - copy requested - No - copy requested - - -  Would patient like information on creating a medical advance directive? - - No - Patient declined No - patient declined information No - patient declined information No - patient declined information    Hospital  Utilization Over the Past 12 Months: # of hospitalizations or ER visits: 0 # of surgeries: 0  Review of Systems    Patient reports that his overall health is unchanged compared to last year.  History obtained from chart review and the patient  Patient Reported Readings (BP, Pulse, CBG, Weight, etc) none  Pain Assessment       Current Medications & Allergies (verified) Allergies as of 06/28/2019      Reactions   Sulfonamide Derivatives    REACTION: rash      Medication List       Accurate as of June 28, 2019  1:30 PM. If you have any questions, ask your nurse or doctor.        STOP taking these medications   Blood Glucose Monitor System w/Device Kit     TAKE these medications   amLODipine 5 MG tablet Commonly known as: NORVASC Take 1 tablet (5 mg total) by mouth daily.   aspirin 81 MG tablet Take 81 mg by mouth daily.   atorvastatin 40 MG tablet Commonly known as: LIPITOR Take 1 tablet (40 mg total) by mouth daily.   carbidopa-levodopa 25-100 MG tablet Commonly known as: SINEMET IR Take 1 tablet by mouth 3 (three) times daily.   irbesartan 75 MG tablet Commonly known as: AVAPRO Take 1 tablet (75 mg total) by mouth daily.   pantoprazole 40 MG tablet Commonly known as: PROTONIX Take 1 tablet (40 mg total) by mouth every other day.   Vitamin D3 50 MCG (2000 UT) Tabs Take 2,000 Units by mouth daily.       History (reviewed): Past Medical History:  Diagnosis Date  . 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 Diabetes mellitus without complication ([place])   . Full dentures    does not wear bottoms  . GERD (gastroesophageal reflux disease)   . Hyperlipidemia   . Hypertension   . Iron deficiency anemia    presumped malabsorption; neg EGD/ capsule endos/colosopcy  . Iron deficiency anemia, unspecified 10/16/2012  . Kidney stones   .  Parkinson's disease [facility])    Past Surgical History:  Procedure Laterality Date  . COLONOSCOPY    . HEMORRHOID SURGERY N/A 07/05/2014   Procedure: HEMORRHOIDECTOMY;  Surgeon: Erroll Luna, MD;  Location: [facility];  Service: General;  Laterality: N/A;  . ORIF RADIAL FRACTURE     right-as child  . robtic assisted laparoscopic     radical prostatectomy and bilateral pelvic lymphadenectomy  . UPPER GI ENDOSCOPY     Family History  Problem Relation Age of Onset  . Heart failure Mother   . Diabetes type II Mother   . Diabetes Mother   . Heart attack Father   . Diabetes Brother   . Diabetes Sister   . Cancer Sister        colon  . Diabetes Sister   . Diabetes Brother   . Cancer Brother        bone and leukemia  . Diabetes Brother   . Diabetes Brother   . Cancer Brother   . Healthy Son        over weight    Social History   Socioeconomic History  . Marital status: Married    Spouse name: Not on file  . Number of children: 1  . Years of education: 32  . Highest education level: 10th grade  Occupational History  . Occupation: retired    Comment: Architect work  Scientific laboratory technician  . Financial resource strain: Not hard at all  . Food insecurity    Worry: Never true    Inability: Never true  . Transportation needs    Medical: No    Non-medical: No  Tobacco Use  . Smoking status: Former Smoker    Packs/day: 1.00    Years: 25.00    Pack years: 25.00    Quit date: 09/18/1981    Years since quitting: 37.8  . Smokeless tobacco: Never Used  Substance and Sexual Activity  . Alcohol use: No  . Drug use: No  . Sexual activity: Yes  Lifestyle  . Physical activity    Days per week: 0 days    Minutes per session: 0 min  . Stress: Only a little  Relationships  . Social connections    Talks on phone: More than three times a week    Gets together: More than three times a week    Attends religious service: More than 4 times per year    Active member of club  or organization: No    Attends meetings of clubs or organizations: Never    Relationship status: Married  Other Topics Concern  . Not on file  Social History Narrative   06/28/2019 Paul [name] lives at home with his wife of 34 years. 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 His son and 3 grandchildren live down the road. They still see them often. He and his wife area active in church and have friends that they eat out with often  Activities of Daily Living In your present state of health, do you have any difficulty performing the following activities: 06/28/2019  Hearing? N  Vision? N  Difficulty concentrating or making decisions? N  Walking or climbing stairs? N  Dressing or bathing? N  Doing errands, shopping? N  Preparing Food and eating ? N  Using the Toilet? N  In the past six months, have you accidently leaked urine? Y  Comment History of prostate cancer  Do you have problems with loss of bowel control? N  Managing your Medications? N  Managing your Finances? N  Housekeeping or managing your Housekeeping? N  Some recent data might be hidden    Patient Education/ Literacy    Exercise Current Exercise Habits: Home exercise routine, Type of exercise: walking, Time (Minutes): 30, Frequency (Times/Week): 3, Weekly Exercise (Minutes/Week): 90, Intensity: Mild  Diet Patient reports consuming 2 meals a day and 1 snack(s) a day Patient reports that his primary diet is: Regular Patient reports that she does have regular access to food.   Depression Screen PHQ 2/9 Scores 06/28/2019 09/04/2018 06/26/2018 03/04/2018 12/02/2017 08/22/2017 05/26/2017  PHQ - 2 Score 0 0 0 0 0 0 0     Fall Risk Fall Risk  06/28/2019 02/10/2019 09/11/2018 09/04/2018 06/26/2018  Falls in the past year? 0 0 0 0 (No Data)  Comment - - - - No stairs inside home. Has a walkin shower.  Number falls in past yr: - 0 0 - -  Injury with Fall? - 0 0 - -  Risk for  fall due to : Impaired balance/gait - - - -  Follow up Falls prevention discussed - Falls evaluation completed - -     Objective:  Paul [name] seemed alert and oriented and he participated appropriately during our telephone visit.  Blood Pressure Weight BMI  BP Readings from Last 3 Encounters:  03/05/19 126/69  09/11/18 110/80  09/04/18 136/75   Wt Readings from Last 3 Encounters:  02/10/19 190 lb (86.2 kg)  09/11/18 193 lb (87.5 kg)  09/04/18 190 lb 12.8 oz (86.5 kg)   BMI Readings from Last 1 Encounters:  02/10/19 29.76 kg/m    *Unable to obtain current vital signs, weight, and BMI due to telephone visit type  Hearing/Vision  . Jerard did not seem to have difficulty with hearing/understanding during the telephone conversation . Reports that he has not had a formal eye exam by an eye care professional within the past year . Reports that he has not had a formal hearing evaluation within the past year *Unable to fully assess hearing and vision during telephone visit type  Cognitive Function: 6CIT Screen 06/28/2019 06/26/2018  What Year? 0 points 0 points  What month? 0 points 0 points  What time? 0 points 0 points  Count back from 20 0 points 0 points  Months in reverse 0 points 0 points  Repeat phrase 0 points 6 points  Total Score 0 6   (Normal:0-7, Significant for Dysfunction: >8)  Normal Cognitive Function Screening: Yes   Immunization & Health Maintenance Record Immunization History  Administered Date(s) Administered  . Fluad Quad(high Dose 65+) 06/01/2019  . Influenza, High Dose Seasonal PF 05/22/2016, 05/21/2017, 05/21/2018  . Influenza,inj,Quad PF,6+ Mos 05/10/2013, 05/05/2014, 05/11/2015  . Pneumococcal Conjugate-13 07/23/2013  . Pneumococcal Polysaccharide-23 11/20/2016  . Tdap 11/23/2013  . Zoster Recombinat (Shingrix) 06/26/2018, 09/04/2018    Health Maintenance  Topic Date Due  . OPHTHALMOLOGY EXAM  06/28/1951  . FOOT EXAM  09/05/2019  .  HEMOGLOBIN A1C  12/13/2019  . TETANUS/TDAP  11/24/2023  . INFLUENZA VACCINE  Completed  . PNA vac Low Risk Adult  Completed       Assessment  This is a routine wellness examination for Paul [name].  Health Maintenance: Due or Overdue Health Maintenance Due  Topic Date Due  . OPHTHALMOLOGY EXAM  06/28/1951    Paul [name] does not need a referral for Community Assistance: Care Management:   no Social Work:    no Prescription Assistance:  no Nutrition/Diabetes Education:  no   Plan:  Personalized Goals Goals Addressed            This Visit's Progress   . AWV       06/28/2019 AWV Goal: Fall Prevention  . Over the next year, patient will decrease their risk for falls by: o Using assistive devices, such as a cane or walker, as needed o Identifying fall risks within their home and correcting them by: - Removing throw rugs - Adding handrails to stairs or ramps - Removing clutter and keeping a clear pathway throughout the home - Increasing light, especially at night - Adding shower handles/bars - Raising toilet seat o Identifying potential personal risk factors for falls: - Medication side effects - Incontinence/urgency - Vestibular dysfunction - Hearing loss - Musculoskeletal disorders - Neurological disorders - Orthostatic hypotension        Personalized Health Maintenance & Screening Recommendations  Diabetic eye exam  Lung Cancer Screening Recommended: no (Low Dose CT Chest recommended if Age 85-80 years, 30 pack-year currently smoking OR have quit w/in past 15 years) Hepatitis C Screening recommended: no HIV Screening recommended: no  Advanced Directives: Written information was not prepared per patient's request.  Referrals & Orders No orders of the defined types were placed in this encounter.   Follow-up Plan . Follow-up with Dettinger, Fransisca Kaufmann, MD as planned . Schedule your yearly eye exam.   I have personally reviewed and noted the  following in the patient's chart:   . Medical and social history . Use of alcohol, tobacco or illicit drugs  . Current medications and supplements . Functional ability and status . Nutritional status . Physical activity . Advanced directives . List of other physicians . Hospitalizations, surgeries, and ER visits in previous 12 months . Vitals . Screenings to include cognitive, depression, and falls . Referrals and appointments  In addition, I have reviewed and discussed with Paul [name] certain preventive protocols, quality metrics, and best practice recommendations. A written personalized care plan for preventive services as well as general preventive health recommendations is available and can be mailed to the patient at his request.      Gareth Morgan LPN  [phone]

## 2019-09-10 ENCOUNTER — Other Ambulatory Visit: Payer: Self-pay

## 2019-09-13 ENCOUNTER — Ambulatory Visit: Payer: Medicare Other | Admitting: Family Medicine

## 2019-09-13 ENCOUNTER — Encounter: Payer: Self-pay | Admitting: Family Medicine

## 2019-09-13 ENCOUNTER — Other Ambulatory Visit: Payer: Self-pay

## 2019-09-13 VITALS — BP 144/74 | HR 72 | Temp 96.8°F | Ht 67.0 in | Wt 197.4 lb

## 2019-09-13 DIAGNOSIS — I152 Hypertension secondary to endocrine disorders: Secondary | ICD-10-CM

## 2019-09-13 DIAGNOSIS — E1169 Type 2 diabetes mellitus with other specified complication: Secondary | ICD-10-CM | POA: Diagnosis not present

## 2019-09-13 DIAGNOSIS — E785 Hyperlipidemia, unspecified: Secondary | ICD-10-CM

## 2019-09-13 DIAGNOSIS — K219 Gastro-esophageal reflux disease without esophagitis: Secondary | ICD-10-CM

## 2019-09-13 DIAGNOSIS — E1159 Type 2 diabetes mellitus with other circulatory complications: Secondary | ICD-10-CM | POA: Diagnosis not present

## 2019-09-13 DIAGNOSIS — I10 Essential (primary) hypertension: Secondary | ICD-10-CM | POA: Diagnosis not present

## 2019-09-13 LAB — BAYER DCA HB A1C WAIVED: HB A1C (BAYER DCA - WAIVED): 6.2 % (ref <7.0)

## 2019-09-13 MED ORDER — CARBIDOPA-LEVODOPA 25-100 MG PO TABS: 1.0000 | ORAL_TABLET | Freq: Three times a day (TID) | ORAL | 3 refills | Status: DC

## 2019-09-13 NOTE — Progress Notes (Signed)
BP (!) 144/74   Pulse 72   Temp (!) 96.8 F (36 C) (Temporal)   Ht 5\' 7"  (1.702 m)   Wt 197 lb 6.4 oz (89.5 kg)   SpO2 95%   BMI 30.92 kg/m    Subjective:   Patient ID: Paul [name], male    DOB: 10-26-1940, 79 y.o.   MRN: [id number]  HPI: Paul [name] is a 79 y.o. male presenting on 09/13/2019 for Diabetes (3 month follow up)   HPI Type 2 diabetes mellitus Patient comes in today for recheck of his diabetes. Patient has been currently taking no medication and A1c is 6.2, diet controlled. Patient is currently on an ACE inhibitor/ARB. Patient has not seen an ophthalmologist this year. Patient denies any issues with their feet.   Hypertension Patient is currently on amlodipine and irbesartan, and their blood pressure today is 144/74. Patient denies any lightheadedness or dizziness. Patient denies headaches, blurred vision, chest pains, shortness of breath, or weakness. Denies any side effects from medication and is content with current medication.   Hyperlipidemia Patient is coming in for recheck of his hyperlipidemia. The patient is currently taking atorvastatin. They deny any issues with myalgias or history of liver damage from it. They deny any focal numbness or weakness or chest pain.   GERD Patient is currently on pantoprazole.  She denies any major symptoms or abdominal pain or belching or burping. She denies any blood in her stool or lightheadedness or dizziness.   Patient also has Parkinson's disease for which he takes carbidopa levodopa and says that its been stable and not worsening, he has not been having any difficulty walking more than he had been and is not having falls.  Recommended encouraging therapy and strengthening at home.  Relevant past medical, surgical, family and social history reviewed and updated as indicated. Interim medical history since our last visit reviewed. Allergies and medications reviewed and updated.  Review of Systems  Constitutional:  Negative for chills and fever.  Eyes: Negative for visual disturbance.  Respiratory: Negative for shortness of breath and wheezing.   Cardiovascular: Negative for chest pain and leg swelling.  Musculoskeletal: Negative for back pain and gait problem.  Skin: Negative for rash.  Neurological: Positive for tremors. Negative for speech difficulty, weakness and numbness.  All other systems reviewed and are negative.   Per HPI unless specifically indicated above   Allergies as of 09/13/2019      Reactions   Sulfonamide Derivatives    REACTION: rash      Medication List       Accurate as of September 13, 2019  9:03 AM. If you have any questions, ask your nurse or doctor.        amLODipine 5 MG tablet Commonly known as: NORVASC Take 1 tablet (5 mg total) by mouth daily.   aspirin 81 MG tablet Take 81 mg by mouth daily.   atorvastatin 40 MG tablet Commonly known as: LIPITOR Take 1 tablet (40 mg total) by mouth daily.   carbidopa-levodopa 25-100 MG tablet Commonly known as: SINEMET IR Take 1 tablet by mouth 3 (three) times daily.   irbesartan 75 MG tablet Commonly known as: AVAPRO Take 1 tablet (75 mg total) by mouth daily.   pantoprazole 40 MG tablet Commonly known as: PROTONIX Take 1 tablet (40 mg total) by mouth every other day.   Vitamin D3 50 MCG (2000 UT) Tabs Take 2,000 Units by mouth daily.  Objective:   BP (!) 144/74   Pulse 72   Temp (!) 96.8 F (36 C) (Temporal)   Ht 5\' 7"  (1.702 m)   Wt 197 lb 6.4 oz (89.5 kg)   SpO2 95%   BMI 30.92 kg/m   Wt Readings from Last 3 Encounters:  09/13/19 197 lb 6.4 oz (89.5 kg)  02/10/19 190 lb (86.2 kg)  09/11/18 193 lb (87.5 kg)    Physical Exam Vitals and nursing note reviewed.  Constitutional:      General: He is not in acute distress.    Appearance: He is well-developed. He is not diaphoretic.  Eyes:     General: No scleral icterus.    Conjunctiva/sclera: Conjunctivae normal.  Neck:     Thyroid:  No thyromegaly.  Cardiovascular:     Rate and Rhythm: Normal rate and regular rhythm.     Heart sounds: Normal heart sounds. No murmur.  Pulmonary:     Effort: Pulmonary effort is normal. No respiratory distress.     Breath sounds: Normal breath sounds. No wheezing.  Musculoskeletal:        General: Normal range of motion.     Cervical back: Neck supple.  Lymphadenopathy:     Cervical: No cervical adenopathy.  Skin:    General: Skin is warm and dry.     Findings: No rash.  Neurological:     Mental Status: He is alert and oriented to person, place, and time.     Coordination: Coordination normal.  Psychiatric:        Behavior: Behavior normal.     Diabetic Foot Exam - Simple   Simple Foot Form Diabetic Foot exam was performed with the following findings: Yes 09/13/2019  9:02 AM  Visual Inspection No deformities, no ulcerations, no other skin breakdown bilaterally: Yes Sensation Testing Intact to touch and monofilament testing bilaterally: Yes Pulse Check Posterior Tibialis and Dorsalis pulse intact bilaterally: Yes Comments      Assessment & Plan:   Problem List Items Addressed This Visit      Cardiovascular and Mediastinum   Hypertension associated with diabetes ([place]) (Chronic)     Digestive   GERD (gastroesophageal reflux disease)     Endocrine   Hyperlipidemia associated with type 2 diabetes mellitus ([place])   Type 2 diabetes mellitus with other specified complication ([place]) - Primary   Relevant Orders   Microalbumin / creatinine urine ratio   Bayer DCA Hb A1c Waived      Patient's hemoglobin A1c is 6.2, looks like he is under control, will run a microalbumin.  The rest of his blood work looks good in November will not run out more extensive panel until next time. Follow up plan: Return in about 3 months (around 12/11/2019), or if symptoms worsen or fail to improve, for Diabetes and hypertension.  Counseling provided for all of the vaccine components Orders  Placed This Encounter  Procedures  . Microalbumin / creatinine urine ratio  . Bayer [facility] Hb A1c [place], MD [facility] 09/13/2019, 9:03 AM

## 2019-09-14 LAB — MICROALBUMIN / CREATININE URINE RATIO
Creatinine, Urine: 175.6 mg/dL
Microalb/Creat Ratio: 7 mg/g creat (ref 0–29)
Microalbumin, Urine: 12.6 ug/mL

## 2019-10-08 NOTE — Progress Notes (Signed)
Paul [name] was seen today in follow up for Parkinsons disease.  My previous records were reviewed prior to todays visit as well as outside records available to me. Pt denies falls.  Pt denies lightheadedness, near syncope.  No hallucinations.  Not doing much CV exercise.  Mood has been good.  Medical records have been reviewed since last visit.  Saw urology in October.    Current prescribed movement disorder medications: carbidopa/levodopa 25/100 tid (8am/noon/4pm)   PREVIOUS MEDICATIONS: Sinemet  ALLERGIES:   Allergies  Allergen Reactions  . Sulfonamide Derivatives     REACTION: rash    CURRENT MEDICATIONS:  Outpatient Encounter Medications as of 10/12/2019  Medication Sig  . amLODipine (NORVASC) 5 MG tablet Take 1 tablet (5 mg total) by mouth daily.  Marland Kitchen aspirin 81 MG tablet Take 81 mg by mouth daily.  Marland Kitchen atorvastatin (LIPITOR) 40 MG tablet Take 1 tablet (40 mg total) by mouth daily.  . carbidopa-levodopa (SINEMET IR) 25-100 MG tablet Take 1 tablet by mouth 3 (three) times daily.  . Cholecalciferol (VITAMIN D3) 2000 UNITS TABS Take 2,000 Units by mouth daily.  . irbesartan (AVAPRO) 75 MG tablet Take 1 tablet (75 mg total) by mouth daily.  . pantoprazole (PROTONIX) 40 MG tablet Take 1 tablet (40 mg total) by mouth every other day.   No facility-administered encounter medications on file as of 10/12/2019.    PHYSICAL EXAMINATION:    VITALS:   Vitals:   10/12/19 0758  BP: (!) 147/67  Pulse: 64  SpO2: 94%  Weight: 201 lb (91.2 kg)  Height: 5\' 6"  (1.676 m)    GEN:  The patient appears stated age and is in NAD. HEENT:  Normocephalic, atraumatic.  The mucous membranes are moist. The superficial temporal arteries are without ropiness or tenderness. CV:  RRR Lungs:  CTAB Neck/HEME:  There are no carotid bruits bilaterally.  Neurological examination:  Orientation: The patient is alert and oriented x3. Cranial nerves: There is good facial symmetry with mild facial  hypomimia. The speech is fluent and clear. Soft palate rises symmetrically and there is no tongue deviation. Hearing is intact to conversational tone. Sensation: Sensation is intact to light touch throughout Motor: Strength is at least antigravity x4.  Movement examination: Tone: There is mild increased tone in the RUE (hasn't taken med yet today) Abnormal movements: mild tremor in the fingers of the R hand Coordination:  There is mild decremation with RAM's,  Gait and Station: The patient has no difficulty arising out of a deep-seated chair without the use of the hands. The patient's stride length is good with decreased arm swing on the right.    I have reviewed and interpreted the following labs independently   Chemistry      Component Value Date/Time   NA 139 06/15/2019 0815   NA 141 04/06/2013 0956   K CANCELED 06/15/2019 0815   K 4.1 04/06/2013 0956   CL 101 06/15/2019 0815   CL 106 10/16/2012 1022   CO2 22 06/15/2019 0815   CO2 24 04/06/2013 0956   BUN 15 06/15/2019 0815   BUN 16.7 04/06/2013 0956   CREATININE 1.05 06/15/2019 0815   CREATININE 1.0 04/06/2013 0956      Component Value Date/Time   CALCIUM 9.6 06/15/2019 0815   CALCIUM 9.7 04/06/2013 0956   ALKPHOS 116 06/15/2019 0815   ALKPHOS 101 04/06/2013 0956   AST CANCELED 06/15/2019 0815   AST 24 04/06/2013 0956   ALT 20 06/15/2019 0815  ALT 23 04/06/2013 0956   BILITOT 0.7 06/15/2019 0815   BILITOT 0.34 04/06/2013 0956     Lab Results  Component Value Date   WBC 8.5 06/15/2019   HGB 15.0 06/15/2019   HCT 44.7 06/15/2019   MCV 95 06/15/2019   PLT 329 06/15/2019      ASSESSMENT/PLAN:  1.  Parkinsons Disease  -continue carbidopa/levodopa 25/100 tid.  A little underdosed today but had not yet taken his morning meds  -exercise!  -We discussed that it used to be thought that levodopa would increase risk of melanoma but now it is believed that Parkinsons itself likely increases risk of melanoma. he is to  get regular skin checks.  -f/u 6 months   Cc:  Dettinger, Fransisca Kaufmann, MD

## 2019-10-12 ENCOUNTER — Ambulatory Visit: Payer: Medicare Other | Admitting: Neurology

## 2019-10-12 ENCOUNTER — Encounter: Payer: Self-pay | Admitting: Neurology

## 2019-10-12 ENCOUNTER — Other Ambulatory Visit: Payer: Self-pay

## 2019-10-12 VITALS — BP 147/67 | HR 64 | Ht 66.0 in | Wt 201.0 lb

## 2019-10-12 DIAGNOSIS — G20 Parkinson's disease: Secondary | ICD-10-CM

## 2019-10-12 NOTE — Patient Instructions (Signed)
1.  Make an appointment with a skin doctor (dermatologist).  Parkinsons Disease increases risk slightly for melanoma so I want you to get a yearly skin check 2.  Continue carbidopa/levodopa 25/100 three times per day 3.  Increase exercise!  The physicians and staff at [facility] are committed to providing excellent care. You may receive a survey requesting feedback about your experience at our office. We strive to receive "very good" responses to the survey questions. If you feel that your experience would prevent you from giving the office a "very good " response, please contact our office to try to remedy the situation. We may be reached at [phone]. Thank you for taking the time out of your busy day to complete the survey.

## 2019-12-13 ENCOUNTER — Encounter: Payer: Self-pay | Admitting: Family Medicine

## 2019-12-13 ENCOUNTER — Ambulatory Visit: Payer: Medicare Other | Admitting: Family Medicine

## 2019-12-13 ENCOUNTER — Other Ambulatory Visit: Payer: Self-pay

## 2019-12-13 VITALS — BP 131/77 | HR 78 | Temp 97.9°F | Ht 66.0 in | Wt 199.4 lb

## 2019-12-13 DIAGNOSIS — I10 Essential (primary) hypertension: Secondary | ICD-10-CM

## 2019-12-13 DIAGNOSIS — E1159 Type 2 diabetes mellitus with other circulatory complications: Secondary | ICD-10-CM

## 2019-12-13 DIAGNOSIS — E785 Hyperlipidemia, unspecified: Secondary | ICD-10-CM

## 2019-12-13 DIAGNOSIS — I152 Hypertension secondary to endocrine disorders: Secondary | ICD-10-CM

## 2019-12-13 DIAGNOSIS — E1169 Type 2 diabetes mellitus with other specified complication: Secondary | ICD-10-CM | POA: Diagnosis not present

## 2019-12-13 DIAGNOSIS — K219 Gastro-esophageal reflux disease without esophagitis: Secondary | ICD-10-CM

## 2019-12-13 DIAGNOSIS — G20 Parkinson's disease: Secondary | ICD-10-CM | POA: Diagnosis not present

## 2019-12-13 LAB — BAYER DCA HB A1C WAIVED: HB A1C (BAYER DCA - WAIVED): 6.5 %

## 2019-12-13 NOTE — Progress Notes (Signed)
BP 131/77   Pulse 78   Temp 97.9 F (36.6 C)   Ht 5\' 6"  (1.676 m)   Wt 199 lb 6 oz (90.4 kg)   SpO2 91%   BMI 32.18 kg/m    Subjective:   Patient ID: Paul [name], male    DOB: 1941/01/07, 79 y.o.   MRN: [id number]  HPI: Paul [name] is a 79 y.o. male presenting on 12/13/2019 for Medical Management of Chronic Issues and Diabetes   HPI Type 2 diabetes mellitus Patient comes in today for recheck of his diabetes. Patient has been currently taking no medication currently,. Patient is currently on an ACE inhibitor/ARB. Patient has not seen an ophthalmologist this year. Patient denies any issues with their feet. The symptom started onset as an adult hypertension and cholesterol ARE RELATED TO DM   Hypertension Patient is currently on amlodipine and irbesartan, and their blood pressure today is 131/77. Patient denies any lightheadedness or dizziness. Patient denies headaches, blurred vision, chest pains, shortness of breath, or weakness. Denies any side effects from medication and is content with current medication.   Hyperlipidemia Patient is coming in for recheck of his hyperlipidemia. The patient is currently taking atorvastatin. They deny any issues with myalgias or history of liver damage from it. They deny any focal numbness or weakness or chest pain.   GERD Patient is currently on Protonix.  She denies any major symptoms or abdominal pain or belching or burping. She denies any blood in her stool or lightheadedness or dizziness.   Patient has Parkinson's disease and sees a neurologist for this and is on carbidopa levodopa  Relevant past medical, surgical, family and social history reviewed and updated as indicated. Interim medical history since our last visit reviewed. Allergies and medications reviewed and updated.  Review of Systems  Constitutional: Negative for chills and fever.  Eyes: Negative for visual disturbance.  Respiratory: Negative for shortness of breath and  wheezing.   Cardiovascular: Negative for chest pain and leg swelling.  Musculoskeletal: Negative for back pain and gait problem.  Skin: Negative for rash.  All other systems reviewed and are negative.   Per HPI unless specifically indicated above   Allergies as of 12/13/2019      Reactions   Sulfonamide Derivatives    REACTION: rash      Medication List       Accurate as of Dec 13, 2019  9:39 AM. If you have any questions, ask your nurse or doctor.        amLODipine 5 MG tablet Commonly known as: NORVASC Take 1 tablet (5 mg total) by mouth daily.   aspirin 81 MG tablet Take 81 mg by mouth daily.   atorvastatin 40 MG tablet Commonly known as: LIPITOR Take 1 tablet (40 mg total) by mouth daily.   carbidopa-levodopa 25-100 MG tablet Commonly known as: SINEMET IR Take 1 tablet by mouth 3 (three) times daily.   irbesartan 75 MG tablet Commonly known as: AVAPRO Take 1 tablet (75 mg total) by mouth daily.   pantoprazole 40 MG tablet Commonly known as: PROTONIX Take 1 tablet (40 mg total) by mouth every other day.   Vitamin D3 50 MCG (2000 UT) Tabs Take 2,000 Units by mouth daily.        Objective:   BP 131/77   Pulse 78   Temp 97.9 F (36.6 C)   Ht 5\' 6"  (1.676 m)   Wt 199 lb 6 oz (90.4 kg)   SpO2  91%   BMI 32.18 kg/m   Wt Readings from Last 3 Encounters:  12/13/19 199 lb 6 oz (90.4 kg)  10/12/19 201 lb (91.2 kg)  09/13/19 197 lb 6.4 oz (89.5 kg)    Physical Exam Vitals and nursing note reviewed.  Constitutional:      General: He is not in acute distress.    Appearance: He is well-developed. He is not diaphoretic.  Eyes:     General: No scleral icterus.    Conjunctiva/sclera: Conjunctivae normal.  Neck:     Thyroid: No thyromegaly.  Cardiovascular:     Rate and Rhythm: Normal rate and regular rhythm.     Heart sounds: Normal heart sounds. No murmur.  Pulmonary:     Effort: Pulmonary effort is normal. No respiratory distress.     Breath  sounds: Normal breath sounds. No wheezing.  Musculoskeletal:        General: Normal range of motion.     Cervical back: Neck supple.  Lymphadenopathy:     Cervical: No cervical adenopathy.  Skin:    General: Skin is warm and dry.     Findings: No rash.  Neurological:     Mental Status: He is alert and oriented to person, place, and time.     Coordination: Coordination normal.  Psychiatric:        Behavior: Behavior normal.       Assessment & Plan:   Problem List Items Addressed This Visit      Cardiovascular and Mediastinum   Hypertension associated with diabetes ([place]) (Chronic)     Digestive   GERD (gastroesophageal reflux disease)     Endocrine   Hyperlipidemia associated with type 2 diabetes mellitus ([place])   Type 2 diabetes mellitus with other specified complication (HCC) - Primary   Relevant Orders   Bayer DCA Hb A1c Waived     Nervous and Auditory   Parkinson's disease ([place])      Continue current medication, no change. Follow up plan: Return in about 3 months (around 03/14/2020), or if symptoms worsen or fail to improve, for Diabetes and hypertension recheck.  Counseling provided for all of the vaccine components Orders Placed This Encounter  Procedures  . Bayer [facility] Series Hb A1c [place], MD [facility] 12/13/2019, 9:39 AM

## 2019-12-22 DIAGNOSIS — H5203 Hypermetropia, bilateral: Secondary | ICD-10-CM | POA: Diagnosis not present

## 2019-12-22 LAB — HM DIABETES EYE EXAM

## 2020-01-12 ENCOUNTER — Encounter: Payer: Self-pay | Admitting: *Deleted

## 2020-03-09 ENCOUNTER — Other Ambulatory Visit: Payer: Self-pay | Admitting: Family Medicine

## 2020-03-09 DIAGNOSIS — E785 Hyperlipidemia, unspecified: Secondary | ICD-10-CM

## 2020-03-09 DIAGNOSIS — I152 Hypertension secondary to endocrine disorders: Secondary | ICD-10-CM

## 2020-03-09 DIAGNOSIS — E1169 Type 2 diabetes mellitus with other specified complication: Secondary | ICD-10-CM

## 2020-03-15 ENCOUNTER — Ambulatory Visit: Payer: Medicare Other | Admitting: Family Medicine

## 2020-03-17 ENCOUNTER — Ambulatory Visit (INDEPENDENT_AMBULATORY_CARE_PROVIDER_SITE_OTHER): Payer: Medicare Other | Admitting: Family Medicine

## 2020-03-17 ENCOUNTER — Encounter: Payer: Self-pay | Admitting: Family Medicine

## 2020-03-17 DIAGNOSIS — K219 Gastro-esophageal reflux disease without esophagitis: Secondary | ICD-10-CM | POA: Diagnosis not present

## 2020-03-17 DIAGNOSIS — I10 Essential (primary) hypertension: Secondary | ICD-10-CM

## 2020-03-17 DIAGNOSIS — E785 Hyperlipidemia, unspecified: Secondary | ICD-10-CM

## 2020-03-17 DIAGNOSIS — I152 Hypertension secondary to endocrine disorders: Secondary | ICD-10-CM

## 2020-03-17 DIAGNOSIS — E1159 Type 2 diabetes mellitus with other circulatory complications: Secondary | ICD-10-CM

## 2020-03-17 DIAGNOSIS — E1169 Type 2 diabetes mellitus with other specified complication: Secondary | ICD-10-CM | POA: Diagnosis not present

## 2020-03-17 MED ORDER — PANTOPRAZOLE SODIUM 40 MG PO TBEC: 40.0000 mg | DELAYED_RELEASE_TABLET | ORAL | 3 refills | Status: DC

## 2020-03-17 MED ORDER — IRBESARTAN 75 MG PO TABS: 75.0000 mg | ORAL_TABLET | Freq: Every day | ORAL | 3 refills | Status: DC

## 2020-03-17 MED ORDER — ATORVASTATIN CALCIUM 40 MG PO TABS: 40.0000 mg | ORAL_TABLET | Freq: Every day | ORAL | 3 refills | Status: DC

## 2020-03-17 MED ORDER — AMLODIPINE BESYLATE 5 MG PO TABS: 5.0000 mg | ORAL_TABLET | Freq: Every day | ORAL | 3 refills | Status: DC

## 2020-03-17 NOTE — Progress Notes (Signed)
Virtual Visit via telephone Note  I connected with Paul [name] on 03/17/20 at 0929 by telephone and verified that I am speaking with the correct person using two identifiers. Paul [name] is currently located at home and no other people are currently with her during visit. The provider, Fransisca Kaufmann Karyssa Amaral, MD is located in their office at time of visit.  Call ended at [phone]  I discussed the limitations, risks, security and privacy concerns of performing an evaluation and management service by telephone and the availability of in person appointments. I also discussed with the patient that there may be a patient responsible charge related to this service. The patient expressed understanding and agreed to proceed.   History and Present Illness: Type 2 diabetes mellitus Patient comes in today for recheck of his diabetes. Patient has been currently taking diet controlled, and A1c 6.5. Patient is currently on an ACE inhibitor/ARB. Patient has seen an ophthalmologist this year. Patient denies any issues with their feet. The symptom started onset as an adult htn and hld ARE RELATED TO DM   Hypertension Patient is currently on amlodipine and irbesartan, and their blood pressure today is 135/72. Patient denies any lightheadedness or dizziness. Patient denies headaches, blurred vision, chest pains, shortness of breath, or weakness. Denies any side effects from medication and is content with current medication.   Hyperlipidemia Patient is coming in for recheck of his hyperlipidemia. The patient is currently taking atorvastatin. They deny any issues with myalgias or history of liver damage from it. They deny any focal numbness or weakness or chest pain.   GERD Patient is currently on pantaprazole.  She denies any major symptoms or abdominal pain or belching or burping. She denies any blood in her stool or lightheadedness or dizziness.   No diagnosis found.  Outpatient Encounter Medications as of  03/17/2020  Medication Sig  . amLODipine (NORVASC) 5 MG tablet Take 1 tablet by mouth once daily  . aspirin 81 MG tablet Take 81 mg by mouth daily.  Marland Kitchen atorvastatin (LIPITOR) 40 MG tablet Take 1 tablet by mouth once daily  . carbidopa-levodopa (SINEMET IR) 25-100 MG tablet Take 1 tablet by mouth 3 (three) times daily.  . Cholecalciferol (VITAMIN D3) 2000 UNITS TABS Take 2,000 Units by mouth daily.  . irbesartan (AVAPRO) 75 MG tablet Take 1 tablet by mouth once daily  . pantoprazole (PROTONIX) 40 MG tablet Take 1 tablet (40 mg total) by mouth every other day.   No facility-administered encounter medications on file as of 03/17/2020.    Review of Systems  Constitutional: Negative for chills and fever.  Eyes: Negative for visual disturbance.  Respiratory: Negative for shortness of breath and wheezing.   Cardiovascular: Negative for chest pain and leg swelling.  Musculoskeletal: Negative for back pain and gait problem.  Skin: Negative for rash.  Neurological: Negative for dizziness, weakness and numbness.  All other systems reviewed and are negative.   Observations/Objective: Patient sounds comfortable and in no acute distress  Assessment and Plan: Problem List Items Addressed This Visit      Cardiovascular and Mediastinum   Hypertension associated with diabetes ([place]) (Chronic)   Relevant Medications   amLODipine (NORVASC) 5 MG tablet   atorvastatin (LIPITOR) 40 MG tablet   irbesartan (AVAPRO) 75 MG tablet   Other Relevant Orders   CMP14+EGFR     Digestive   GERD (gastroesophageal reflux disease)   Relevant Medications   pantoprazole (PROTONIX) 40 MG tablet   Other Relevant  Orders   CBC with Differential/Platelet     Endocrine   Hyperlipidemia associated with type 2 diabetes mellitus ([place])   Relevant Medications   atorvastatin (LIPITOR) 40 MG tablet   irbesartan (AVAPRO) 75 MG tablet   Other Relevant Orders   Lipid panel   Type 2 diabetes mellitus with other specified  complication (HCC) - Primary   Relevant Medications   atorvastatin (LIPITOR) 40 MG tablet   irbesartan (AVAPRO) 75 MG tablet   Other Relevant Orders   CMP14+EGFR   Bayer DCA Hb A1c Waived      Continue current medication, no change, patient will come in to do blood work Follow up plan: Return in about 3 months (around 06/17/2020), or if symptoms worsen or fail to improve, for dm recheck and htn.     I discussed the assessment and treatment plan with the patient. The patient was provided an opportunity to ask questions and all were answered. The patient agreed with the plan and demonstrated an understanding of the instructions.   The patient was advised to call back or seek an in-person evaluation if the symptoms worsen or if the condition fails to improve as anticipated.  The above assessment and management plan was discussed with the patient. The patient verbalized understanding of and has agreed to the management plan. Patient is aware to call the clinic if symptoms persist or worsen. Patient is aware when to return to the clinic for a follow-up visit. Patient educated on when it is appropriate to go to the emergency department.    I provided 7 minutes of non-face-to-face time during this encounter.    Worthy Rancher, MD

## 2020-03-20 ENCOUNTER — Other Ambulatory Visit: Payer: Medicare Other

## 2020-03-20 ENCOUNTER — Other Ambulatory Visit: Payer: Self-pay

## 2020-03-20 DIAGNOSIS — E785 Hyperlipidemia, unspecified: Secondary | ICD-10-CM | POA: Diagnosis not present

## 2020-03-20 DIAGNOSIS — K219 Gastro-esophageal reflux disease without esophagitis: Secondary | ICD-10-CM

## 2020-03-20 DIAGNOSIS — E1159 Type 2 diabetes mellitus with other circulatory complications: Secondary | ICD-10-CM

## 2020-03-20 DIAGNOSIS — I152 Hypertension secondary to endocrine disorders: Secondary | ICD-10-CM

## 2020-03-20 DIAGNOSIS — I10 Essential (primary) hypertension: Secondary | ICD-10-CM | POA: Diagnosis not present

## 2020-03-20 DIAGNOSIS — E1169 Type 2 diabetes mellitus with other specified complication: Secondary | ICD-10-CM

## 2020-03-20 LAB — CMP14+EGFR
ALT: 20 IU/L (ref 0–44)
AST: 19 IU/L (ref 0–40)
Albumin/Globulin Ratio: 1.6 (ref 1.2–2.2)
Albumin: 4.4 g/dL (ref 3.7–4.7)
Alkaline Phosphatase: 111 IU/L (ref 48–121)
BUN/Creatinine Ratio: 13 (ref 10–24)
BUN: 13 mg/dL (ref 8–27)
Bilirubin Total: 0.7 mg/dL (ref 0.0–1.2)
CO2: 24 mmol/L (ref 20–29)
Calcium: 9.5 mg/dL (ref 8.6–10.2)
Chloride: 103 mmol/L (ref 96–106)
Creatinine, Ser: 1.01 mg/dL (ref 0.76–1.27)
GFR calc Af Amer: 82 mL/min/{1.73_m2} (ref >59)
GFR calc non Af Amer: 71 mL/min/{1.73_m2} (ref >59)
Globulin, Total: 2.7 g/dL (ref 1.5–4.5)
Glucose: 120 mg/dL — ABNORMAL HIGH (ref 65–99)
Potassium: 4.3 mmol/L (ref 3.5–5.2)
Sodium: 141 mmol/L (ref 134–144)
Total Protein: 7.1 g/dL (ref 6.0–8.5)

## 2020-03-20 LAB — CBC WITH DIFFERENTIAL/PLATELET
Basophils Absolute: 0 10*3/uL (ref 0.0–0.2)
Basos: 1 %
EOS (ABSOLUTE): 0.2 10*3/uL (ref 0.0–0.4)
Eos: 3 %
Hematocrit: 46.5 % (ref 37.5–51.0)
Hemoglobin: 15.1 g/dL (ref 13.0–17.7)
Immature Grans (Abs): 0 10*3/uL (ref 0.0–0.1)
Immature Granulocytes: 0 %
Lymphocytes Absolute: 3.7 10*3/uL — ABNORMAL HIGH (ref 0.7–3.1)
Lymphs: 43 %
MCH: 32.4 pg (ref 26.6–33.0)
MCHC: 32.5 g/dL (ref 31.5–35.7)
MCV: 100 fL — ABNORMAL HIGH (ref 79–97)
Monocytes Absolute: 0.7 10*3/uL (ref 0.1–0.9)
Monocytes: 9 %
Neutrophils Absolute: 3.9 10*3/uL (ref 1.4–7.0)
Neutrophils: 44 %
Platelets: 310 10*3/uL (ref 150–450)
RBC: 4.66 x10E6/uL (ref 4.14–5.80)
RDW: 13 % (ref 11.6–15.4)
WBC: 8.6 10*3/uL (ref 3.4–10.8)

## 2020-03-20 LAB — LIPID PANEL
Chol/HDL Ratio: 2.8 ratio (ref 0.0–5.0)
Cholesterol, Total: 147 mg/dL (ref 100–199)
HDL: 53 mg/dL (ref >39)
LDL Chol Calc (NIH): 74 mg/dL (ref 0–99)
Triglycerides: 109 mg/dL (ref 0–149)
VLDL Cholesterol Cal: 20 mg/dL (ref 5–40)

## 2020-03-20 LAB — BAYER DCA HB A1C WAIVED: HB A1C (BAYER DCA - WAIVED): 6.5 % (ref <7.0)

## 2020-04-17 NOTE — Progress Notes (Signed)
Assessment/Plan:   1.  Parkinsons Disease  -Continue carbidopa/levodopa 25/100, 1 tablet at 8 AM/noon/4 PM  -discussed that we may need to increase carbidopa/levodopa in the next visit or two  -increase exercise.    -We discussed that it used to be thought that levodopa would increase risk of melanoma but now it is believed that Parkinsons itself likely increases risk of melanoma. he is to get regular skin checks.    2.   F/u 6 months  Subjective:   Paul [name] was seen today in follow up for Parkinsons disease.  This patient is accompanied in the office by his spouse who supplements the history.  My previous records were reviewed prior to todays visit as well as outside records available to me. Pt denies falls.  Pt denies lightheadedness, near syncope.  No hallucinations.  Mood has been good.  Not exercising.  Saw primary care on August 13.  Records reviewed.  Current prescribed movement disorder medications: Carbidopa/levodopa 25/100, 1 tablet 3 times per day    ALLERGIES:   Allergies  Allergen Reactions  . Sulfonamide Derivatives     REACTION: rash    CURRENT MEDICATIONS:  Outpatient Encounter Medications as of 04/19/2020  Medication Sig  . amLODipine (NORVASC) 5 MG tablet Take 1 tablet (5 mg total) by mouth daily.  Marland Kitchen aspirin 81 MG tablet Take 81 mg by mouth daily.  Marland Kitchen atorvastatin (LIPITOR) 40 MG tablet Take 1 tablet (40 mg total) by mouth daily.  . carbidopa-levodopa (SINEMET IR) 25-100 MG tablet Take 1 tablet by mouth 3 (three) times daily.  . Cholecalciferol (VITAMIN D3) 2000 UNITS TABS Take 2,000 Units by mouth daily.  . irbesartan (AVAPRO) 75 MG tablet Take 1 tablet (75 mg total) by mouth daily.  . pantoprazole (PROTONIX) 40 MG tablet Take 1 tablet (40 mg total) by mouth every other day.   No facility-administered encounter medications on file as of 04/19/2020.    Objective:   PHYSICAL EXAMINATION:    VITALS:   Vitals:   04/19/20 0934  BP: 138/70    Pulse: 63  SpO2: 95%  Weight: 197 lb (89.4 kg)  Height: 5\' 6"  (1.676 m)    GEN:  The patient appears stated age and is in NAD. HEENT:  Normocephalic, atraumatic.  The mucous membranes are moist. The superficial temporal arteries are without ropiness or tenderness. CV:  RRR Lungs:  CTAB Neck/HEME:  There are no carotid bruits bilaterally.  Neurological examination:  Orientation: The patient is alert and oriented x3. Cranial nerves: There is good facial symmetry with facial hypomimia. The speech is fluent and hypophonic. Soft palate rises symmetrically and there is no tongue deviation. Hearing is intact to conversational tone. Sensation: Sensation is intact to light touch throughout Motor: Strength is at least antigravity x4.  Movement examination: Tone: There is mild increased tone in the RUE  Abnormal movements: rare rest tremor on the right Coordination:  There is mild decremation with RAM's, with any form of RAMS, including alternating supination and pronation of the forearm, hand opening and closing, finger taps, heel taps and toe taps on the right Gait and Station: The patient has no difficulty arising out of a deep-seated chair without the use of the hands. The patient's stride length is good.    I have reviewed and interpreted the following labs independently    Chemistry      Component Value Date/Time   NA 141 03/20/2020 0829   NA 141 04/06/2013 0956  K 4.3 03/20/2020 0829   K 4.1 04/06/2013 0956   CL 103 03/20/2020 0829   CL 106 10/16/2012 1022   CO2 24 03/20/2020 0829   CO2 24 04/06/2013 0956   BUN 13 03/20/2020 0829   BUN 16.7 04/06/2013 0956   CREATININE 1.01 03/20/2020 0829   CREATININE 1.0 04/06/2013 0956      Component Value Date/Time   CALCIUM 9.5 03/20/2020 0829   CALCIUM 9.7 04/06/2013 0956   ALKPHOS 111 03/20/2020 0829   ALKPHOS 101 04/06/2013 0956   AST 19 03/20/2020 0829   AST 24 04/06/2013 0956   ALT 20 03/20/2020 0829   ALT 23 04/06/2013  0956   BILITOT 0.7 03/20/2020 0829   BILITOT 0.34 04/06/2013 0956       Lab Results  Component Value Date   WBC 8.6 03/20/2020   HGB 15.1 03/20/2020   HCT 46.5 03/20/2020   MCV 100 (H) 03/20/2020   PLT 310 03/20/2020    Lab Results  Component Value Date   TSH 1.89 12/03/2017     Total time spent on today's visit was 30 minutes, including both face-to-face time and nonface-to-face time.  Time included that spent on review of records (prior notes available to me/labs/imaging if pertinent), discussing treatment and goals, answering patient's questions and coordinating care.  Cc:  Dettinger, Fransisca Kaufmann, MD

## 2020-04-19 ENCOUNTER — Other Ambulatory Visit: Payer: Self-pay

## 2020-04-19 ENCOUNTER — Encounter: Payer: Self-pay | Admitting: Neurology

## 2020-04-19 ENCOUNTER — Ambulatory Visit (INDEPENDENT_AMBULATORY_CARE_PROVIDER_SITE_OTHER): Payer: Medicare Other | Admitting: Neurology

## 2020-04-19 VITALS — BP 138/70 | HR 63 | Ht 66.0 in | Wt 197.0 lb

## 2020-04-19 DIAGNOSIS — G20 Parkinson's disease: Secondary | ICD-10-CM | POA: Diagnosis not present

## 2020-04-19 NOTE — Patient Instructions (Signed)
Get your dermatology/skin examination yearly  Increase exercise!  The physicians and staff at [facility] are committed to providing excellent care. You may receive a survey requesting feedback about your experience at our office. We strive to receive "very good" responses to the survey questions. If you feel that your experience would prevent you from giving the office a "very good " response, please contact our office to try to remedy the situation. We may be reached at [phone]. Thank you for taking the time out of your busy day to complete the survey.

## 2020-05-15 DIAGNOSIS — C61 Malignant neoplasm of prostate: Secondary | ICD-10-CM | POA: Diagnosis not present

## 2020-05-15 DIAGNOSIS — G20 Parkinson's disease: Secondary | ICD-10-CM | POA: Diagnosis not present

## 2020-05-15 DIAGNOSIS — N200 Calculus of kidney: Secondary | ICD-10-CM | POA: Diagnosis not present

## 2020-05-17 ENCOUNTER — Other Ambulatory Visit: Payer: Self-pay

## 2020-05-17 ENCOUNTER — Ambulatory Visit (INDEPENDENT_AMBULATORY_CARE_PROVIDER_SITE_OTHER): Payer: Medicare Other

## 2020-05-17 DIAGNOSIS — Z23 Encounter for immunization: Secondary | ICD-10-CM

## 2020-06-07 ENCOUNTER — Other Ambulatory Visit: Payer: Self-pay | Admitting: Family Medicine

## 2020-06-07 DIAGNOSIS — E1169 Type 2 diabetes mellitus with other specified complication: Secondary | ICD-10-CM

## 2020-06-19 ENCOUNTER — Other Ambulatory Visit: Payer: Self-pay

## 2020-06-19 ENCOUNTER — Ambulatory Visit: Payer: Medicare Other | Admitting: Family Medicine

## 2020-06-19 ENCOUNTER — Encounter: Payer: Self-pay | Admitting: Family Medicine

## 2020-06-19 VITALS — BP 141/70 | HR 74 | Temp 97.0°F | Ht 66.0 in | Wt 195.0 lb

## 2020-06-19 DIAGNOSIS — I152 Hypertension secondary to endocrine disorders: Secondary | ICD-10-CM

## 2020-06-19 DIAGNOSIS — E1169 Type 2 diabetes mellitus with other specified complication: Secondary | ICD-10-CM | POA: Diagnosis not present

## 2020-06-19 DIAGNOSIS — K219 Gastro-esophageal reflux disease without esophagitis: Secondary | ICD-10-CM | POA: Diagnosis not present

## 2020-06-19 DIAGNOSIS — E785 Hyperlipidemia, unspecified: Secondary | ICD-10-CM | POA: Diagnosis not present

## 2020-06-19 DIAGNOSIS — E1159 Type 2 diabetes mellitus with other circulatory complications: Secondary | ICD-10-CM

## 2020-06-19 MED ORDER — CARBIDOPA-LEVODOPA 25-100 MG PO TABS
1.0000 | ORAL_TABLET | Freq: Three times a day (TID) | ORAL | 3 refills | Status: DC
Start: 2020-06-19 — End: 2020-12-21

## 2020-06-19 MED ORDER — ATORVASTATIN CALCIUM 40 MG PO TABS: 40.0000 mg | ORAL_TABLET | Freq: Every day | ORAL | 3 refills | Status: DC

## 2020-06-19 NOTE — Progress Notes (Signed)
BP (!) 141/70   Pulse 74   Temp (!) 97 F (36.1 C)   Ht 5\' 6"  (1.676 m)   Wt 195 lb (88.5 kg)   SpO2 94%   BMI 31.47 kg/m    Subjective:   Patient ID: Paul [name], male    DOB: February 15, 1941, 79 y.o.   MRN: [id number]  HPI: Paul [name] is a 79 y.o. male presenting on 06/19/2020 for Medical Management of Chronic Issues, Diabetes, and Hyperlipidemia   HPI Type 2 diabetes mellitus Patient comes in today for recheck of his diabetes. Patient has been currently taking no medication currently has been doing diet control. Patient is currently on an ACE inhibitor/ARB. Patient has seen an ophthalmologist this year. Patient denies any issues with their feet. The symptom started onset as an adult hyperlipidemia and hypertension ARE RELATED TO DM   Hyperlipidemia Patient is coming in for recheck of his hyperlipidemia. The patient is currently taking atorvastatin. They deny any issues with myalgias or history of liver damage from it. They deny any focal numbness or weakness or chest pain.   Hypertension Patient is currently on amlodipine and irbesartan, and their blood pressure today is 141/70. Patient denies any lightheadedness or dizziness. Patient denies headaches, blurred vision, chest pains, shortness of breath, or weakness. Denies any side effects from medication and is content with current medication.   GERD Patient is currently on pantoprazole.  She denies any major symptoms or abdominal pain or belching or burping. She denies any blood in her stool or lightheadedness or dizziness.   Relevant past medical, surgical, family and social history reviewed and updated as indicated. Interim medical history since our last visit reviewed. Allergies and medications reviewed and updated.  Review of Systems  Constitutional: Negative for chills and fever.  Eyes: Negative for visual disturbance.  Respiratory: Negative for shortness of breath and wheezing.   Cardiovascular: Negative for  chest pain and leg swelling.  Musculoskeletal: Negative for back pain and gait problem.  Skin: Negative for rash.  Neurological: Positive for tremors. Negative for dizziness, weakness and light-headedness.  All other systems reviewed and are negative.   Per HPI unless specifically indicated above   Allergies as of 06/19/2020      Reactions   Sulfonamide Derivatives    REACTION: rash      Medication List       Accurate as of June 19, 2020  8:27 AM. If you have any questions, ask your nurse or doctor.        amLODipine 5 MG tablet Commonly known as: NORVASC Take 1 tablet (5 mg total) by mouth daily.   aspirin 81 MG tablet Take 81 mg by mouth daily.   atorvastatin 40 MG tablet Commonly known as: LIPITOR Take 1 tablet (40 mg total) by mouth daily.   carbidopa-levodopa 25-100 MG tablet Commonly known as: SINEMET IR Take 1 tablet by mouth 3 (three) times daily.   irbesartan 75 MG tablet Commonly known as: AVAPRO Take 1 tablet (75 mg total) by mouth daily.   pantoprazole 40 MG tablet Commonly known as: PROTONIX Take 1 tablet (40 mg total) by mouth every other day.   Vitamin D3 50 MCG (2000 UT) Tabs Take 2,000 Units by mouth daily.        Objective:   BP (!) 141/70   Pulse 74   Temp (!) 97 F (36.1 C)   Ht 5\' 6"  (1.676 m)   Wt 195 lb (88.5 kg)   SpO2  94%   BMI 31.47 kg/m   Wt Readings from Last 3 Encounters:  06/19/20 195 lb (88.5 kg)  04/19/20 197 lb (89.4 kg)  12/13/19 199 lb 6 oz (90.4 kg)    Physical Exam Vitals and nursing note reviewed.  Constitutional:      General: He is not in acute distress.    Appearance: He is well-developed. He is not diaphoretic.  Eyes:     General: No scleral icterus.    Conjunctiva/sclera: Conjunctivae normal.  Neck:     Thyroid: No thyromegaly.  Cardiovascular:     Rate and Rhythm: Normal rate and regular rhythm.     Heart sounds: Normal heart sounds. No murmur heard.   Pulmonary:     Effort: Pulmonary  effort is normal. No respiratory distress.     Breath sounds: Normal breath sounds. No wheezing.  Musculoskeletal:     Cervical back: Neck supple.  Lymphadenopathy:     Cervical: No cervical adenopathy.  Skin:    General: Skin is warm and dry.     Findings: No rash.  Neurological:     Mental Status: He is alert and oriented to person, place, and time.  Psychiatric:        Behavior: Behavior normal.       Assessment & Plan:   Problem List Items Addressed This Visit      Cardiovascular and Mediastinum   Hypertension associated with diabetes ([place]) (Chronic)   Relevant Medications   atorvastatin (LIPITOR) 40 MG tablet     Digestive   GERD (gastroesophageal reflux disease)     Endocrine   Hyperlipidemia associated with type 2 diabetes mellitus (HCC)   Relevant Medications   atorvastatin (LIPITOR) 40 MG tablet   Type 2 diabetes mellitus with other specified complication (HCC) - Primary   Relevant Medications   atorvastatin (LIPITOR) 40 MG tablet   Other Relevant Orders   Bayer DCA Hb A1c Waived      No change in medication, will check A1c. Follow up plan: Return in about 3 months (around 09/19/2020), or if symptoms worsen or fail to improve, for Diabetes and hypertension and cholesterol.  Counseling provided for all of the vaccine components Orders Placed This Encounter  Procedures  . Bayer [facility] Hb A1c Waived    Caryl Pina, MD [facility] 06/19/2020, 8:27 AM

## 2020-06-20 LAB — BAYER DCA HB A1C WAIVED: HB A1C (BAYER DCA - WAIVED): 6.2 % (ref <7.0)

## 2020-07-14 ENCOUNTER — Ambulatory Visit (INDEPENDENT_AMBULATORY_CARE_PROVIDER_SITE_OTHER): Payer: Medicare Other | Admitting: *Deleted

## 2020-07-14 VITALS — Ht 66.0 in | Wt 195.1 lb

## 2020-07-14 DIAGNOSIS — Z0000 Encounter for general adult medical examination without abnormal findings: Secondary | ICD-10-CM

## 2020-07-14 NOTE — Progress Notes (Addendum)
MEDICARE ANNUAL WELLNESS VISIT  07/14/2020  Telephone Visit Disclaimer This Medicare AWV was conducted by telephone due to national recommendations for restrictions regarding the COVID-19 Pandemic (e.g. social distancing).  I verified, using two identifiers, that I am speaking with Paul [name] or their authorized healthcare agent. I discussed the limitations, risks, security, and privacy concerns of performing an evaluation and management service by telephone and the potential availability of an in-person appointment in the future. The patient expressed understanding and agreed to proceed.  Location of Patient: Home Location of Provider (nurse):  office  Subjective:    Paul [name] is a 79 y.o. male patient of Dettinger, Fransisca Kaufmann, MD who had a Medicare Annual Wellness Visit today via telephone. Amarrion is Retired and lives with their spouse. he has 1 child. he reports that he is socially active and does interact with friends/family regularly. he is minimally physically active and enjoys gardening.  Patient Care Team: Dettinger, Fransisca Kaufmann, MD as PCP - General (Family Medicine) Gearlean Alf, PA-C as Physician Assistant (Physician Assistant) Chipper Herb, MD (Inactive) as Attending Physician (Family Medicine) Myrlene Broker, MD as Attending Physician (Urology) Tat, Eustace Quail, DO as Consulting Physician (Neurology)  Advanced Directives 07/14/2020 04/19/2020 10/12/2019 06/28/2019 02/10/2019 05/26/2017 12/08/2014  Does Patient Have a Medical Advance Directive? Yes Yes Yes Yes Yes Yes No  Type of Paramedic of [place];Living will Living will;Healthcare Power of [place];Living will Living will;Healthcare Power of [place];Living will -  Does patient want to make changes to medical advance directive? No - Patient declined - - - - - -  Copy of [place] in Chart? No - copy  requested - - No - copy requested - No - copy requested -  Would patient like information on creating a medical advance directive? - - - - - No - Patient declined No - patient declined information    Hospital Utilization Over the Past 12 Months: # of hospitalizations or ER visits: 0 # of surgeries: 0  Review of Systems    Patient reports that his overall health is unchanged compared to last year.  History obtained from chart review and the patient General ROS: negative  Patient Reported Readings (BP, Pulse, CBG, Weight, etc) none  Pain Assessment Pain : No/denies pain     Current Medications & Allergies (verified) Allergies as of 07/14/2020      Reactions   Sulfonamide Derivatives    REACTION: rash      Medication List       Accurate as of July 14, 2020  3:03 PM. If you have any questions, ask your nurse or doctor.        amLODipine 5 MG tablet Commonly known as: NORVASC Take 1 tablet (5 mg total) by mouth daily.   aspirin 81 MG tablet Take 81 mg by mouth daily.   atorvastatin 40 MG tablet Commonly known as: LIPITOR Take 1 tablet (40 mg total) by mouth daily.   carbidopa-levodopa 25-100 MG tablet Commonly known as: SINEMET IR Take 1 tablet by mouth 3 (three) times daily.   irbesartan 75 MG tablet Commonly known as: AVAPRO Take 1 tablet (75 mg total) by mouth daily.   pantoprazole 40 MG tablet Commonly known as: PROTONIX Take 1 tablet (40 mg total) by mouth every other day.   Vitamin D3 50 MCG (2000 UT) Tabs Take 2,000 Units by mouth daily.  History (reviewed): Past Medical History:  Diagnosis Date  . 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 Diabetes mellitus without complication ([place])   . Full dentures    does not wear bottoms  . GERD (gastroesophageal reflux disease)   . Hyperlipidemia   . Hypertension   . Iron  deficiency anemia    presumped malabsorption; neg EGD/ capsule endos/colosopcy  . Iron deficiency anemia, unspecified 10/16/2012  . Kidney stones   . Parkinson's disease [facility])    Past Surgical History:  Procedure Laterality Date  . COLONOSCOPY    . HEMORRHOID SURGERY N/A 07/05/2014   Procedure: HEMORRHOIDECTOMY;  Surgeon: Erroll Luna, MD;  Location: [facility];  Service: General;  Laterality: N/A;  . ORIF RADIAL FRACTURE     right-as child  . robtic assisted laparoscopic     radical prostatectomy and bilateral pelvic lymphadenectomy  . UPPER GI ENDOSCOPY     Family History  Problem Relation Age of Onset  . Heart failure Mother   . Diabetes type II Mother   . Diabetes Mother   . Heart attack Father   . Diabetes Brother   . Diabetes Sister   . Cancer Sister        colon  . Diabetes Sister   . Diabetes Brother   . Cancer Brother        bone and leukemia  . Diabetes Brother   . Diabetes Brother   . Cancer Brother   . Healthy Son        over weight    Social History   Socioeconomic History  . Marital status: Married    Spouse name: Not on file  . Number of children: 1  . Years of education: 29  . Highest education level: 10th grade  Occupational History  . Occupation: retired    Comment: Architect work  Tobacco Use  . Smoking status: Former Smoker    Packs/day: 1.00    Years: 25.00    Pack years: 25.00    Quit date: 09/18/1981    Years since quitting: 38.8  . Smokeless tobacco: Never Used  Vaping Use  . Vaping Use: Never used  Substance and Sexual Activity  . Alcohol use: No  . Drug use: No  . Sexual activity: Yes  Other Topics Concern  . Not on file  Social History Narrative   06/28/2019 Mr Gartman lives at home with his wife of 54 years. 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 His son and 3 grandchildren live down the road. They still see them often.    Social Determinants of Health    Financial Resource Strain: Low Risk   . Difficulty of Paying Living Expenses: Not hard at all  Food Insecurity: No Food Insecurity  . Worried About Charity fundraiser in the Last Year: Never true  . Ran Out of Food in the Last Year: Never true  Transportation Needs: No Transportation Needs  . Lack of Transportation (Medical): No  . Lack of Transportation (Non-Medical): No  Physical Activity: Inactive  . Days of Exercise per Week: 0 days  . Minutes of Exercise per Session: 0 min  Stress: No Stress Concern Present  . Feeling of Stress : Not at all  Social Connections: Moderately Integrated  . Frequency of Communication with Friends and Family: More  than three times a week  . Frequency of Social Gatherings with Friends and Family: More than three times a week  . Attends Religious Services: More than 4 times per year  . Active Member of Clubs or Organizations: No  . Attends Archivist Meetings: Never  . Marital Status: Married    Activities of Daily Living In your present state of health, do you have any difficulty performing the following activities: 07/14/2020  Hearing? Y  Vision? N  Difficulty concentrating or making decisions? N  Walking or climbing stairs? N  Dressing or bathing? N  Doing errands, shopping? N  Preparing Food and eating ? N  Using the Toilet? N  In the past six months, have you accidently leaked urine? N  Managing your Medications? N  Managing your Finances? N  Housekeeping or managing your Housekeeping? N  Some recent data might be hidden    Patient Education/ Literacy How often do you need to have someone help you when you read instructions, pamphlets, or other written materials from your doctor or pharmacy?: 1 - Never What is the last grade level you completed in school?: 10th Grade  Exercise Current Exercise Habits: The patient does not participate in regular exercise at present, Exercise limited by: None identified  Diet Patient  reports consuming 3 meals a day and 0 snack(s) a day Patient reports that his primary diet is: Regular Patient reports that she does have regular access to food.   Depression Screen PHQ 2/9 Scores 07/14/2020 07/14/2020 06/19/2020 09/13/2019 06/28/2019 09/04/2018 06/26/2018  PHQ - 2 Score 0 0 0 0 0 0 0     Fall Risk Fall Risk  07/14/2020 06/19/2020 04/19/2020 12/13/2019 10/12/2019  Falls in the past year? 0 0 0 1 0  Comment - - - - -  Number falls in past yr: 0 - 0 0 0  Injury with Fall? 0 - 0 1 0  Risk for fall due to : No Fall Risks - - Impaired balance/gait -  Follow up Falls evaluation completed - - Falls evaluation completed -     Objective:  Paul [name] seemed alert and oriented and he participated appropriately during our telephone visit.  Blood Pressure Weight BMI  BP Readings from Last 3 Encounters:  06/19/20 (!) 141/70  04/19/20 138/70  12/13/19 131/77   Wt Readings from Last 3 Encounters:  07/14/20 195 lb 1.7 oz (88.5 kg)  06/19/20 195 lb (88.5 kg)  04/19/20 197 lb (89.4 kg)   BMI Readings from Last 1 Encounters:  07/14/20 31.49 kg/m    *Unable to obtain current vital signs, weight, and BMI due to telephone visit type  Hearing/Vision  . Christophe did  seem to have difficulty with hearing/understanding during the telephone conversation . Reports that he has had a formal eye exam by an eye care professional within the past year . Reports that he has had a formal hearing evaluation within the past year *Unable to fully assess hearing and vision during telephone visit type  Cognitive Function: 6CIT Screen 07/14/2020 06/28/2019 06/26/2018  What Year? 0 points 0 points 0 points  What month? 0 points 0 points 0 points  What time? 0 points 0 points 0 points  Count back from 20 0 points 0 points 0 points  Months in reverse 0 points 0 points 0 points  Repeat phrase 0 points 0 points 6 points  Total Score 0 0 6   (Normal:0-7, Significant for Dysfunction: >8)  Normal  Cognitive  Function Screening: Yes   Immunization & Health Maintenance Record Immunization History  Administered Date(s) Administered  . Fluad Quad(high Dose 65+) 06/01/2019, 05/17/2020  . Influenza, High Dose Seasonal PF 05/22/2016, 05/21/2017, 05/21/2018  . Influenza,inj,Quad PF,6+ Mos 05/10/2013, 05/05/2014, 05/11/2015  . Moderna SARS-COVID-2 Vaccination 09/02/2018, 09/25/2019  . Pneumococcal Conjugate-13 07/23/2013  . Pneumococcal Polysaccharide-23 11/20/2016  . Tdap 11/23/2013  . Zoster Recombinat (Shingrix) 06/26/2018, 09/04/2018    Health Maintenance  Topic Date Due  . COVID-19 Vaccine (3 - Moderna risk 4-dose series) 10/23/2019  . FOOT EXAM  09/12/2020  . HEMOGLOBIN A1C  12/17/2020  . OPHTHALMOLOGY EXAM  12/21/2020  . TETANUS/TDAP  11/24/2023  . INFLUENZA VACCINE  Completed  . PNA vac Low Risk Adult  Completed  . Hepatitis C Screening  Discontinued       Assessment  This is a routine wellness examination for Paul [name].  Health Maintenance: Due or Overdue Health Maintenance Due  Topic Date Due  . COVID-19 Vaccine (3 - Moderna risk 4-dose series) 10/23/2019    Paul [name] does not need a referral for Community Assistance: Care Management:   no Social Work:    no Prescription Assistance:  no Nutrition/Diabetes Education:  no   Plan:  Personalized Goals Goals Addressed            This Visit's Progress   . Exercise 150 minutes per week (moderate activity)       07/14/2020 AWV Goal: Exercise for General Health   Patient will verbalize understanding of the benefits of increased physical activity:  Exercising regularly is important. It will improve your overall fitness, flexibility, and endurance.  Regular exercise also will improve your overall health. It can help you control your weight, reduce stress, and improve your bone density.  Over the next year, patient will increase physical activity as tolerated with a goal of at least 150 minutes of  moderate physical activity per week.   You can tell that you are exercising at a moderate intensity if your heart starts beating faster and you start breathing faster but can still hold a conversation.  Moderate-intensity exercise ideas include:  Walking 1 mile (1.6 km) in about 15 minutes  Biking  Hiking  Golfing  Dancing  Water aerobics  Patient will verbalize understanding of everyday activities that increase physical activity by providing examples like the following: ? Yard work, such as: ? Pushing a Conservation officer, nature ? Raking and bagging leaves ? Washing your car ? Pushing a stroller ? Shoveling snow ? Gardening ? Washing windows or floors  Patient will be able to explain general safety guidelines for exercising:   Before you start a new exercise program, talk with your health care provider.  Do not exercise so much that you hurt yourself, feel dizzy, or get very short of breath.  Wear comfortable clothes and wear shoes with good support.  Drink plenty of water while you exercise to prevent dehydration or heat stroke.  Work out until your breathing and your heartbeat get faster.       Personalized Health Maintenance & Screening Recommendations  Advanced directives: has an advanced directive - a copy HAS NOT been provided.  Lung Cancer Screening Recommended: yes (Low Dose CT Chest recommended if Age 56-80 years, 30 pack-year currently smoking OR have quit w/in past 15 years) Hepatitis C Screening recommended: no HIV Screening recommended: no  Advanced Directives: Written information was not prepared per patient's request.  Referrals & Orders No orders of the defined  types were placed in this encounter.   Follow-up Plan . Follow-up with Dettinger, Fransisca Kaufmann, MD as planned    I have personally reviewed and noted the following in the patient's chart:   . Medical and social history . Use of alcohol, tobacco or illicit drugs  . Current medications and  supplements . Functional ability and status . Nutritional status . Physical activity . Advanced directives . List of other physicians . Hospitalizations, surgeries, and ER visits in previous 12 months . Vitals . Screenings to include cognitive, depression, and falls . Referrals and appointments  In addition, I have reviewed and discussed with Paul [name] certain preventive protocols, quality metrics, and best practice recommendations. A written personalized care plan for preventive services as well as general preventive health recommendations is available and can be mailed to the patient at his request.      Wardell Gawronski, LPN  07/26/4113     AVS Printed and mailed to patient   I have reviewed and agree with the above AWV documentation Caryl Pina, MD [facility] 07/19/2020, 3:39 PM

## 2020-07-14 NOTE — Patient Instructions (Signed)
  [place] Maintenance Summary and Written Plan of Care  [name] ,  Thank you for allowing me to perform your Medicare Annual Wellness Visit and for your ongoing commitment to your health.   Health Maintenance & Immunization History Health Maintenance  Topic Date Due  . COVID-19 Vaccine (3 - Moderna risk 4-dose series) 10/23/2019  . FOOT EXAM  09/12/2020  . HEMOGLOBIN A1C  12/17/2020  . OPHTHALMOLOGY EXAM  12/21/2020  . TETANUS/TDAP  11/24/2023  . INFLUENZA VACCINE  Completed  . PNA vac Low Risk Adult  Completed  . Hepatitis C Screening  Discontinued   Immunization History  Administered Date(s) Administered  . Fluad Quad(high Dose 65+) 06/01/2019, 05/17/2020  . Influenza, High Dose Seasonal PF 05/22/2016, 05/21/2017, 05/21/2018  . Influenza,inj,Quad PF,6+ Mos 05/10/2013, 05/05/2014, 05/11/2015  . Moderna SARS-COVID-2 Vaccination 09/02/2018, 09/25/2019  . Pneumococcal Conjugate-13 07/23/2013  . Pneumococcal Polysaccharide-23 11/20/2016  . Tdap 11/23/2013  . Zoster Recombinat (Shingrix) 06/26/2018, 09/04/2018    These are the patient goals that we discussed: Goals Addressed            This Visit's Progress   . Exercise 150 minutes per week (moderate activity)       07/14/2020 AWV Goal: Exercise for General Health   Patient will verbalize understanding of the benefits of increased physical activity:  Exercising regularly is important. It will improve your overall fitness, flexibility, and endurance.  Regular exercise also will improve your overall health. It can help you control your weight, reduce stress, and improve your bone density.  Over the next year, patient will increase physical activity as tolerated with a goal of at least 150 minutes of moderate physical activity per week.   You can tell that you are exercising at a moderate intensity if your heart starts beating faster and you start breathing faster but can still hold a  conversation.  Moderate-intensity exercise ideas include:  Walking 1 mile (1.6 km) in about 15 minutes  Biking  Hiking  Golfing  Dancing  Water aerobics  Patient will verbalize understanding of everyday activities that increase physical activity by providing examples like the following: ? Yard work, such as: ? Pushing a Conservation officer, nature ? Raking and bagging leaves ? Washing your car ? Pushing a stroller ? Shoveling snow ? Gardening ? Washing windows or floors  Patient will be able to explain general safety guidelines for exercising:   Before you start a new exercise program, talk with your health care provider.  Do not exercise so much that you hurt yourself, feel dizzy, or get very short of breath.  Wear comfortable clothes and wear shoes with good support.  Drink plenty of water while you exercise to prevent dehydration or heat stroke.  Work out until your breathing and your heartbeat get faster.         This is a list of Health Maintenance Items that are overdue or due now: Health Maintenance Due  Topic Date Due  . COVID-19 Vaccine (3 - Moderna risk 4-dose series) 10/23/2019     Orders/Referrals Placed Today: No orders of the defined types were placed in this encounter.  (Contact our referral department at [phone] if you have not spoken with someone about your referral appointment within the next 5 days)    Follow-up Plan Follow up with Dr. Warrick Parisian as scheduled

## 2020-09-20 ENCOUNTER — Other Ambulatory Visit: Payer: Self-pay

## 2020-09-20 ENCOUNTER — Encounter: Payer: Self-pay | Admitting: Family Medicine

## 2020-09-20 ENCOUNTER — Ambulatory Visit (INDEPENDENT_AMBULATORY_CARE_PROVIDER_SITE_OTHER): Payer: Medicare Other | Admitting: Family Medicine

## 2020-09-20 VITALS — BP 124/61 | HR 66 | Temp 97.3°F | Resp 20 | Ht 66.0 in | Wt 195.0 lb

## 2020-09-20 DIAGNOSIS — I152 Hypertension secondary to endocrine disorders: Secondary | ICD-10-CM

## 2020-09-20 DIAGNOSIS — E1159 Type 2 diabetes mellitus with other circulatory complications: Secondary | ICD-10-CM | POA: Diagnosis not present

## 2020-09-20 DIAGNOSIS — E1169 Type 2 diabetes mellitus with other specified complication: Secondary | ICD-10-CM | POA: Diagnosis not present

## 2020-09-20 DIAGNOSIS — E785 Hyperlipidemia, unspecified: Secondary | ICD-10-CM

## 2020-09-20 LAB — BAYER DCA HB A1C WAIVED: HB A1C (BAYER DCA - WAIVED): 6.1 % (ref <7.0)

## 2020-09-20 NOTE — Progress Notes (Signed)
BP 124/61   Pulse 66   Temp (!) 97.3 F (36.3 C) (Temporal)   Resp 20   Ht 5' 6"  (1.676 m)   Wt 195 lb (88.5 kg)   SpO2 94%   BMI 31.47 kg/m    Subjective:   Patient ID: [name], male    DOB: [birth year], 80 y.o.   MRN: [id number]  HPI: ANGELICA WIX is a 80 y.o. male presenting on 09/20/2020 for Medical Management of Chronic Issues   HPI Type 2 diabetes mellitus Patient comes in today for recheck of his diabetes. Patient has been currently taking no medication currently, A1c is 6.1, continue with diet control. Patient is currently on an ACE inhibitor/ARB. Patient has not seen an ophthalmologist this year. Patient denies any issues with their feet. The symptom started onset as an adult hypertension hyperlipidemia ARE RELATED TO DM   Hyperlipidemia Patient is coming in for recheck of his hyperlipidemia. The patient is currently taking atorvastatin. They deny any issues with myalgias or history of liver damage from it. They deny any focal numbness or weakness or chest pain.   Hypertension Patient is currently on amlodipine and irbesartan, and their blood pressure today is 124/61. Patient denies any lightheadedness or dizziness. Patient denies headaches, blurred vision, chest pains, shortness of breath, or weakness. Denies any side effects from medication and is content with current medication.   Relevant past medical, surgical, family and social history reviewed and updated as indicated. Interim medical history since our last visit reviewed. Allergies and medications reviewed and updated.  Review of Systems  Constitutional: Negative for chills and fever.  Respiratory: Negative for shortness of breath and wheezing.   Cardiovascular: Negative for chest pain and leg swelling.  Musculoskeletal: Negative for back pain and gait problem.  Skin: Negative for rash.  Neurological: Negative for dizziness, weakness and light-headedness.  All other systems reviewed and are  negative.   Per HPI unless specifically indicated above   Allergies as of 09/20/2020      Reactions   Sulfonamide Derivatives    REACTION: rash      Medication List       Accurate as of September 20, 2020  8:42 AM. If you have any questions, ask your nurse or doctor.        amLODipine 5 MG tablet Commonly known as: NORVASC Take 1 tablet (5 mg total) by mouth daily.   aspirin 81 MG tablet Take 81 mg by mouth daily.   atorvastatin 40 MG tablet Commonly known as: LIPITOR Take 1 tablet (40 mg total) by mouth daily.   carbidopa-levodopa 25-100 MG tablet Commonly known as: SINEMET IR Take 1 tablet by mouth 3 (three) times daily.   irbesartan 75 MG tablet Commonly known as: AVAPRO Take 1 tablet (75 mg total) by mouth daily.   pantoprazole 40 MG tablet Commonly known as: PROTONIX Take 1 tablet (40 mg total) by mouth every other day.   Vitamin D3 50 MCG (2000 UT) Tabs Take 2,000 Units by mouth daily.        Objective:   BP 124/61   Pulse 66   Temp (!) 97.3 F (36.3 C) (Temporal)   Resp 20   Ht 5' 6"  (1.676 m)   Wt 195 lb (88.5 kg)   SpO2 94%   BMI 31.47 kg/m   Wt Readings from Last 3 Encounters:  09/20/20 195 lb (88.5 kg)  07/14/20 195 lb 1.7 oz (88.5 kg)  06/19/20 195 lb (88.5 kg)  Physical Exam Vitals and nursing note reviewed.  Constitutional:      General: He is not in acute distress.    Appearance: He is well-developed and well-nourished. He is not diaphoretic.  Eyes:     General: No scleral icterus.    Extraocular Movements: EOM normal.     Conjunctiva/sclera: Conjunctivae normal.  Neck:     Thyroid: No thyromegaly.  Cardiovascular:     Rate and Rhythm: Normal rate and regular rhythm.     Pulses: Intact distal pulses.     Heart sounds: Normal heart sounds. No murmur heard.   Pulmonary:     Effort: Pulmonary effort is normal. No respiratory distress.     Breath sounds: Normal breath sounds. No wheezing.  Musculoskeletal:         General: No edema. Normal range of motion.     Cervical back: Neck supple.  Lymphadenopathy:     Cervical: No cervical adenopathy.  Skin:    General: Skin is warm and dry.     Findings: No rash.  Neurological:     Mental Status: He is alert and oriented to person, place, and time.     Coordination: Coordination normal.  Psychiatric:        Mood and Affect: Mood and affect normal.        Behavior: Behavior normal.     A1c 6.1  Assessment & Plan:   Problem List Items Addressed This Visit      Cardiovascular and Mediastinum   Hypertension associated with diabetes ([place]) (Chronic)   Relevant Orders   CBC with Differential/Platelet   CMP14+EGFR     Endocrine   Hyperlipidemia associated with type 2 diabetes mellitus ([place])   Relevant Orders   Lipid panel   Type 2 diabetes mellitus with other specified complication ([place]) - Primary   Relevant Orders   Bayer DCA Hb A1c Waived   Microalbumin / creatinine urine ratio      Patient is doing very well, no change in medication, encouraged increasing exercise especially with bicycling to keep strength up with Parkinson's. Follow up plan: Return in about 3 months (around 12/18/2020), or if symptoms worsen or fail to improve, for Diabetes hypertension and cholesterol.  Counseling provided for all of the vaccine components Orders Placed This Encounter  Procedures  . Bayer DCA Hb A1c Waived  . CBC with Differential/Platelet  . CMP14+EGFR  . Lipid panel  . Microalbumin / creatinine urine ratio    Caryl Pina, MD [facility] 09/20/2020, 8:42 AM

## 2020-09-21 LAB — LIPID PANEL
Chol/HDL Ratio: 2.8 ratio (ref 0.0–5.0)
Cholesterol, Total: 150 mg/dL (ref 100–199)
HDL: 54 mg/dL (ref >39)
LDL Chol Calc (NIH): 75 mg/dL (ref 0–99)
Triglycerides: 121 mg/dL (ref 0–149)
VLDL Cholesterol Cal: 21 mg/dL (ref 5–40)

## 2020-09-21 LAB — CMP14+EGFR
ALT: 19 IU/L (ref 0–44)
AST: 19 IU/L (ref 0–40)
Albumin/Globulin Ratio: 1.6 (ref 1.2–2.2)
Albumin: 4.3 g/dL (ref 3.7–4.7)
Alkaline Phosphatase: 113 IU/L (ref 44–121)
BUN/Creatinine Ratio: 16 (ref 10–24)
BUN: 16 mg/dL (ref 8–27)
Bilirubin Total: 0.6 mg/dL (ref 0.0–1.2)
CO2: 24 mmol/L (ref 20–29)
Calcium: 9.9 mg/dL (ref 8.6–10.2)
Chloride: 100 mmol/L (ref 96–106)
Creatinine, Ser: 0.99 mg/dL (ref 0.76–1.27)
GFR calc Af Amer: 83 mL/min/{1.73_m2} (ref >59)
GFR calc non Af Amer: 72 mL/min/{1.73_m2} (ref >59)
Globulin, Total: 2.7 g/dL (ref 1.5–4.5)
Glucose: 119 mg/dL — ABNORMAL HIGH (ref 65–99)
Potassium: 4 mmol/L (ref 3.5–5.2)
Sodium: 138 mmol/L (ref 134–144)
Total Protein: 7 g/dL (ref 6.0–8.5)

## 2020-09-21 LAB — MICROALBUMIN / CREATININE URINE RATIO
Creatinine, Urine: 68.4 mg/dL
Microalb/Creat Ratio: 9 mg/g creat (ref 0–29)
Microalbumin, Urine: 6 ug/mL

## 2020-09-21 LAB — CBC WITH DIFFERENTIAL/PLATELET
Basophils Absolute: 0.1 10*3/uL (ref 0.0–0.2)
Basos: 1 %
EOS (ABSOLUTE): 0.2 10*3/uL (ref 0.0–0.4)
Eos: 2 %
Hematocrit: 45.5 % (ref 37.5–51.0)
Hemoglobin: 14.9 g/dL (ref 13.0–17.7)
Immature Grans (Abs): 0 10*3/uL (ref 0.0–0.1)
Immature Granulocytes: 0 %
Lymphocytes Absolute: 3.6 10*3/uL — ABNORMAL HIGH (ref 0.7–3.1)
Lymphs: 47 %
MCH: 32.2 pg (ref 26.6–33.0)
MCHC: 32.7 g/dL (ref 31.5–35.7)
MCV: 98 fL — ABNORMAL HIGH (ref 79–97)
Monocytes Absolute: 0.7 10*3/uL (ref 0.1–0.9)
Monocytes: 9 %
Neutrophils Absolute: 3.1 10*3/uL (ref 1.4–7.0)
Neutrophils: 41 %
Platelets: 325 10*3/uL (ref 150–450)
RBC: 4.63 x10E6/uL (ref 4.14–5.80)
RDW: 12.8 % (ref 11.6–15.4)
WBC: 7.6 10*3/uL (ref 3.4–10.8)

## 2020-10-16 NOTE — Progress Notes (Signed)
Assessment/Plan:   1.  Parkinsons Disease  -Continue carbidopa/levodopa 25/100, 1 tablet at 8 AM/noon/4 PM.  He is a bit underdosed but doesn't want to change meds right now.  PCP filling meds  -We discussed that it used to be thought that levodopa would increase risk of melanoma but now it is believed that Parkinsons itself likely increases risk of melanoma. he is to get regular skin checks.  -needs to increased exercise    Subjective:   Paul [name] was seen today in follow up for Parkinsons disease.  My previous records were reviewed prior to todays visit as well as outside records available to me. Pt with wife who supplements hx.  Pt denies falls.  Pt denies lightheadedness, near syncope.  No hallucinations.  Mood has been good.  Not exercising.   Reviewed records from his February office visit with primary care.  Current prescribed movement disorder medications: Carbidopa/levodopa 25/100, 1 tablet 3 times per day.   ALLERGIES:   Allergies  Allergen Reactions  . Sulfonamide Derivatives     REACTION: rash    CURRENT MEDICATIONS:  Outpatient Encounter Medications as of 10/18/2020  Medication Sig  . amLODipine (NORVASC) 5 MG tablet Take 1 tablet (5 mg total) by mouth daily.  Marland Kitchen aspirin 81 MG tablet Take 81 mg by mouth daily.  Marland Kitchen atorvastatin (LIPITOR) 40 MG tablet Take 1 tablet (40 mg total) by mouth daily.  . carbidopa-levodopa (SINEMET IR) 25-100 MG tablet Take 1 tablet by mouth 3 (three) times daily.  . Cholecalciferol (VITAMIN D3) 2000 UNITS TABS Take 2,000 Units by mouth daily.  . irbesartan (AVAPRO) 75 MG tablet Take 1 tablet (75 mg total) by mouth daily.  . pantoprazole (PROTONIX) 40 MG tablet Take 1 tablet (40 mg total) by mouth every other day.   No facility-administered encounter medications on file as of 10/18/2020.    Objective:   PHYSICAL EXAMINATION:    VITALS:   Vitals:   10/18/20 0939  BP: 120/64  Pulse: 65  SpO2: 96%  Weight: 197 lb (89.4 kg)   Height: 5\' 4"  (1.626 m)    GEN:  The patient appears stated age and is in NAD. HEENT:  Normocephalic, atraumatic.  The mucous membranes are moist. The superficial temporal arteries are without ropiness or tenderness. CV:  RRR Lungs:  CTAB Neck/HEME:  There are no carotid bruits bilaterally.  Neurological examination:  Orientation: The patient is alert and oriented x3. Cranial nerves: There is good facial symmetry with facial hypomimia. The speech is fluent and clear. Soft palate rises symmetrically and there is no tongue deviation. Hearing is intact to conversational tone. Sensation: Sensation is intact to light touch throughout Motor: Strength is at least antigravity x4.  Movement examination: Tone: There is mod increased tone in the RUE Abnormal movements: there is RUE rest tremor. Coordination:  There is no significant decremation with RAM's, with any form of RAMS, including alternating supination and pronation of the forearm, hand opening and closing, finger taps, heel taps and toe taps. Gait and Station: The patient has min difficulty arising out of a deep-seated chair without the use of the hands. The patient's stride length is good.    I have reviewed and interpreted the following labs independently    Chemistry      Component Value Date/Time   NA 138 09/20/2020 0830   NA 141 04/06/2013 0956   K 4.0 09/20/2020 0830   K 4.1 04/06/2013 0956   CL 100 09/20/2020 0830  CL 106 10/16/2012 1022   CO2 24 09/20/2020 0830   CO2 24 04/06/2013 0956   BUN 16 09/20/2020 0830   BUN 16.7 04/06/2013 0956   CREATININE 0.99 09/20/2020 0830   CREATININE 1.0 04/06/2013 0956      Component Value Date/Time   CALCIUM 9.9 09/20/2020 0830   CALCIUM 9.7 04/06/2013 0956   ALKPHOS 113 09/20/2020 0830   ALKPHOS 101 04/06/2013 0956   AST 19 09/20/2020 0830   AST 24 04/06/2013 0956   ALT 19 09/20/2020 0830   ALT 23 04/06/2013 0956   BILITOT 0.6 09/20/2020 0830   BILITOT 0.34 04/06/2013  0956       Lab Results  Component Value Date   WBC 7.6 09/20/2020   HGB 14.9 09/20/2020   HCT 45.5 09/20/2020   MCV 98 (H) 09/20/2020   PLT 325 09/20/2020    Lab Results  Component Value Date   TSH 1.89 12/03/2017     Total time spent on today's visit was 20 minutes, including both face-to-face time and nonface-to-face time.  Time included that spent on review of records (prior notes available to me/labs/imaging if pertinent), discussing treatment and goals, answering patient's questions and coordinating care.  Cc:  Dettinger, Fransisca Kaufmann, MD

## 2020-10-18 ENCOUNTER — Encounter: Payer: Self-pay | Admitting: Neurology

## 2020-10-18 ENCOUNTER — Other Ambulatory Visit: Payer: Self-pay

## 2020-10-18 ENCOUNTER — Ambulatory Visit: Payer: Medicare Other | Admitting: Neurology

## 2020-10-18 VITALS — BP 120/64 | HR 65 | Ht 64.0 in | Wt 197.0 lb

## 2020-10-18 DIAGNOSIS — G20 Parkinson's disease: Secondary | ICD-10-CM | POA: Diagnosis not present

## 2020-10-18 NOTE — Patient Instructions (Signed)
As we discussed, it used to be thought that levodopa would increase risk of melanoma but now it is believed that Parkinsons itself likely increases risk of melanoma. I recommend yearly skin checks with a board certified dermatologist.  You can call [facility] or [facility] for an appointment.  [facility] Address: [address] [place], [place], [place] [address] Phone: [phone]  [facility] [address], [place], [place] [address] Phone: [phone]

## 2020-12-21 ENCOUNTER — Ambulatory Visit (INDEPENDENT_AMBULATORY_CARE_PROVIDER_SITE_OTHER): Payer: Medicare Other | Admitting: Family Medicine

## 2020-12-21 ENCOUNTER — Encounter: Payer: Self-pay | Admitting: Family Medicine

## 2020-12-21 ENCOUNTER — Other Ambulatory Visit: Payer: Self-pay

## 2020-12-21 VITALS — BP 135/77 | HR 61 | Ht 64.0 in | Wt 197.0 lb

## 2020-12-21 DIAGNOSIS — E1159 Type 2 diabetes mellitus with other circulatory complications: Secondary | ICD-10-CM | POA: Diagnosis not present

## 2020-12-21 DIAGNOSIS — E1169 Type 2 diabetes mellitus with other specified complication: Secondary | ICD-10-CM

## 2020-12-21 DIAGNOSIS — I152 Hypertension secondary to endocrine disorders: Secondary | ICD-10-CM

## 2020-12-21 DIAGNOSIS — K219 Gastro-esophageal reflux disease without esophagitis: Secondary | ICD-10-CM | POA: Diagnosis not present

## 2020-12-21 DIAGNOSIS — E785 Hyperlipidemia, unspecified: Secondary | ICD-10-CM

## 2020-12-21 LAB — BAYER DCA HB A1C WAIVED: HB A1C (BAYER DCA - WAIVED): 6.3 %

## 2020-12-21 MED ORDER — CARBIDOPA-LEVODOPA 25-100 MG PO TABS: 1.0000 | ORAL_TABLET | Freq: Three times a day (TID) | ORAL | 3 refills | Status: DC

## 2020-12-21 MED ORDER — IRBESARTAN 75 MG PO TABS: 75.0000 mg | ORAL_TABLET | Freq: Every day | ORAL | 3 refills | Status: DC

## 2020-12-21 MED ORDER — ATORVASTATIN CALCIUM 40 MG PO TABS: 40.0000 mg | ORAL_TABLET | Freq: Every day | ORAL | 3 refills | Status: DC

## 2020-12-21 MED ORDER — AMLODIPINE BESYLATE 5 MG PO TABS: 5.0000 mg | ORAL_TABLET | Freq: Every day | ORAL | 3 refills | Status: DC

## 2020-12-21 MED ORDER — PANTOPRAZOLE SODIUM 40 MG PO TBEC: 40.0000 mg | DELAYED_RELEASE_TABLET | ORAL | 3 refills | Status: DC

## 2020-12-21 NOTE — Progress Notes (Signed)
BP 135/77   Pulse 61   Ht 5\' 4"  (1.626 m)   Wt 197 lb (89.4 kg)   SpO2 93%   BMI 33.81 kg/m    Subjective:   Patient ID: Paul [name], male    DOB: 09-17-1940, 80 y.o.   MRN: [id number]  HPI: Paul [name] is a 80 y.o. male presenting on 12/21/2020 for Medical Management of Chronic Issues, Diabetes, and Hyperlipidemia   HPI Type 2 diabetes mellitus Patient comes in today for recheck of his diabetes. Patient has been currently taking no medication has been diet controlled. Patient is currently on an ACE inhibitor/ARB. Patient has not seen an ophthalmologist this year. Patient denies any issues with their feet. The symptom started onset as an adult hypertension and hyperlipidemia ARE RELATED TO DM   Hypertension Patient is currently on amlodipine and irbesartan, and their blood pressure today is 135/77. Patient denies any lightheadedness or dizziness. Patient denies headaches, blurred vision, chest pains, shortness of breath, or weakness. Denies any side effects from medication and is content with current medication.    Hyperlipidemia Patient is coming in for recheck of his hyperlipidemia. The patient is currently taking Lipitor. They deny any issues with myalgias or history of liver damage from it. They deny any focal numbness or weakness or chest pain.   He still sees neurology for Parkinson's and it does look like tremors are getting worse, he still walking pretty good but his tremors have worsened recently  Relevant past medical, surgical, family and social history reviewed and updated as indicated. Interim medical history since our last visit reviewed. Allergies and medications reviewed and updated.  Review of Systems  Constitutional: Negative for chills and fever.  Eyes: Negative for visual disturbance.  Respiratory: Negative for shortness of breath and wheezing.   Cardiovascular: Negative for chest pain and leg swelling.  Musculoskeletal: Positive for gait problem.   Skin: Negative for rash.  Neurological: Negative for dizziness, weakness and light-headedness.  All other systems reviewed and are negative.   Per HPI unless specifically indicated above   Allergies as of 12/21/2020      Reactions   Sulfonamide Derivatives    REACTION: rash      Medication List       Accurate as of Dec 21, 2020  8:15 AM. If you have any questions, ask your nurse or doctor.        amLODipine 5 MG tablet Commonly known as: NORVASC Take 1 tablet (5 mg total) by mouth daily.   aspirin 81 MG tablet Take 81 mg by mouth daily.   atorvastatin 40 MG tablet Commonly known as: LIPITOR Take 1 tablet (40 mg total) by mouth daily.   carbidopa-levodopa 25-100 MG tablet Commonly known as: SINEMET IR Take 1 tablet by mouth 3 (three) times daily.   irbesartan 75 MG tablet Commonly known as: AVAPRO Take 1 tablet (75 mg total) by mouth daily.   pantoprazole 40 MG tablet Commonly known as: PROTONIX Take 1 tablet (40 mg total) by mouth every other day.   Vitamin D3 50 MCG (2000 UT) Tabs Take 2,000 Units by mouth daily.        Objective:   BP 135/77   Pulse 61   Ht 5\' 4"  (1.626 m)   Wt 197 lb (89.4 kg)   SpO2 93%   BMI 33.81 kg/m   Wt Readings from Last 3 Encounters:  12/21/20 197 lb (89.4 kg)  10/18/20 197 lb (89.4 kg)  09/20/20 195  lb (88.5 kg)    Physical Exam Vitals and nursing note reviewed.  Constitutional:      General: He is not in acute distress.    Appearance: He is well-developed. He is not diaphoretic.  Eyes:     General: No scleral icterus.    Conjunctiva/sclera: Conjunctivae normal.  Neck:     Thyroid: No thyromegaly.  Cardiovascular:     Rate and Rhythm: Normal rate and regular rhythm.     Heart sounds: Normal heart sounds. No murmur heard.   Pulmonary:     Effort: Pulmonary effort is normal. No respiratory distress.     Breath sounds: Normal breath sounds. No wheezing.  Musculoskeletal:        General: No swelling.      Cervical back: Neck supple.  Lymphadenopathy:     Cervical: No cervical adenopathy.  Skin:    General: Skin is warm and dry.     Findings: No rash.  Neurological:     Mental Status: He is alert and oriented to person, place, and time.     Motor: Tremor present.  Psychiatric:        Behavior: Behavior normal.       Assessment & Plan:   Problem List Items Addressed This Visit      Cardiovascular and Mediastinum   Hypertension associated with diabetes ([place]) (Chronic)   Relevant Medications   amLODipine (NORVASC) 5 MG tablet   atorvastatin (LIPITOR) 40 MG tablet   irbesartan (AVAPRO) 75 MG tablet     Digestive   GERD (gastroesophageal reflux disease)   Relevant Medications   pantoprazole (PROTONIX) 40 MG tablet     Endocrine   Hyperlipidemia associated with type 2 diabetes mellitus ([facility])   Relevant Medications   atorvastatin (LIPITOR) 40 MG tablet   irbesartan (AVAPRO) 75 MG tablet   Type 2 diabetes mellitus with other specified complication ([facility]) - Primary   Relevant Medications   atorvastatin (LIPITOR) 40 MG tablet   irbesartan (AVAPRO) 75 MG tablet   Other Relevant Orders   Bayer DCA Hb A1c Waived      Continue current medication.  No changes, A1c is 6.3, continue with diet control.  No change in medications Follow up plan: Return in about 3 months (around 03/23/2021), or if symptoms worsen or fail to improve, for Diabetes and hypertension and cholesterol.  Counseling provided for all of the vaccine components Orders Placed This Encounter  Procedures  . Bayer [facility] Hb A1c [place], MD [facility] 12/21/2020, 8:15 AM

## 2021-03-27 ENCOUNTER — Encounter: Payer: Self-pay | Admitting: Neurology

## 2021-04-02 ENCOUNTER — Encounter: Payer: Self-pay | Admitting: Family Medicine

## 2021-04-02 ENCOUNTER — Ambulatory Visit (INDEPENDENT_AMBULATORY_CARE_PROVIDER_SITE_OTHER): Payer: Medicare Other

## 2021-04-02 ENCOUNTER — Ambulatory Visit (INDEPENDENT_AMBULATORY_CARE_PROVIDER_SITE_OTHER): Payer: Medicare Other | Admitting: Family Medicine

## 2021-04-02 ENCOUNTER — Other Ambulatory Visit: Payer: Self-pay | Admitting: Family Medicine

## 2021-04-02 ENCOUNTER — Other Ambulatory Visit: Payer: Self-pay

## 2021-04-02 VITALS — BP 133/73 | HR 72 | Temp 98.2°F | Ht 64.0 in | Wt 195.0 lb

## 2021-04-02 DIAGNOSIS — I152 Hypertension secondary to endocrine disorders: Secondary | ICD-10-CM | POA: Diagnosis not present

## 2021-04-02 DIAGNOSIS — W19XXXA Unspecified fall, initial encounter: Secondary | ICD-10-CM

## 2021-04-02 DIAGNOSIS — M25561 Pain in right knee: Secondary | ICD-10-CM | POA: Diagnosis not present

## 2021-04-02 DIAGNOSIS — E1159 Type 2 diabetes mellitus with other circulatory complications: Secondary | ICD-10-CM

## 2021-04-02 DIAGNOSIS — M25511 Pain in right shoulder: Secondary | ICD-10-CM | POA: Diagnosis not present

## 2021-04-02 DIAGNOSIS — K219 Gastro-esophageal reflux disease without esophagitis: Secondary | ICD-10-CM | POA: Diagnosis not present

## 2021-04-02 DIAGNOSIS — E1169 Type 2 diabetes mellitus with other specified complication: Secondary | ICD-10-CM

## 2021-04-02 DIAGNOSIS — E785 Hyperlipidemia, unspecified: Secondary | ICD-10-CM

## 2021-04-02 LAB — BAYER DCA HB A1C WAIVED: HB A1C (BAYER DCA - WAIVED): 6.4 % (ref <7.0)

## 2021-04-02 NOTE — Progress Notes (Signed)
BP 133/73   Pulse 72   Temp 98.2 F (36.8 C) (Temporal)   Ht _0  (1.626 m)   Wt 195 lb (88.5 kg)   SpO2 100%   BMI 33.47 kg/m    Subjective:   Patient ID: Paul [name], male    DOB: [birth year], 80 y.o.   MRN: [id number]  HPI: Paul [name] is a 80 y.o. male presenting on 04/02/2021 for Hypertension, Diabetes, Medical Management of Chronic Issues, Shoulder Pain (right), and Knee Pain (right)   HPI Patient is coming in complaining of right shoulder pain.  Patient says he was working on the deck 2 weeks ago and fell through one of the boards that was weak and hurt his right knee and his right shoulder.  Right shoulder hurts more in the posterior aspect of the deltoid and the right near hurts on the medial aspect of the knee and just above it.  He denies any popping or catching or giving way.  The shoulder hurts more when he reaches back or up overhead and the knee hurts more when he puts weight on it.  Type 2 diabetes mellitus Patient comes in today for recheck of his diabetes. Patient has been currently taking no medication currently, will check A1c today. Patient is currently on an ACE inhibitor/ARB. Patient has not seen an ophthalmologist this year. Patient denies any issues with their feet. The symptom started onset as an adult hypertension and hyperlipidemia ARE RELATED TO DM   Hypertension Patient is currently on amlodipine and irbesartan, and their blood pressure today is 133/73. Patient denies any lightheadedness or dizziness. Patient denies headaches, blurred vision, chest pains, shortness of breath, or weakness. Denies any side effects from medication and is content with current medication.   Hyperlipidemia Patient is coming in for recheck of his hyperlipidemia. The patient is currently taking atorvastatin. They deny any issues with myalgias or history of liver damage from it. They deny any focal numbness or weakness or chest pain.   GERD Patient is currently on  pantoprazole.  She denies any major symptoms or abdominal pain or belching or burping. She denies any blood in her stool or lightheadedness or dizziness.   Relevant past medical, surgical, family and social history reviewed and updated as indicated. Interim medical history since our last visit reviewed. Allergies and medications reviewed and updated.  Review of Systems  Constitutional:  Negative for chills and fever.  Respiratory:  Negative for shortness of breath and wheezing.   Cardiovascular:  Negative for chest pain and leg swelling.  Musculoskeletal:  Positive for arthralgias, gait problem (Related to his Parkinson's) and myalgias. Negative for back pain, neck pain and neck stiffness.  Skin:  Negative for rash.  Neurological:  Negative for headaches.  All other systems reviewed and are negative.  Per HPI unless specifically indicated above   Allergies as of 04/02/2021       Reactions   Sulfonamide Derivatives    REACTION: rash        Medication List        Accurate as of April 02, 2021  8:20 AM. If you have any questions, ask your nurse or doctor.          amLODipine 5 MG tablet Commonly known as: NORVASC Take 1 tablet (5 mg total) by mouth daily.   aspirin 81 MG tablet Take 81 mg by mouth daily.   atorvastatin 40 MG tablet Commonly known as: LIPITOR Take 1 tablet (40 mg total) by  mouth daily.   carbidopa-levodopa 25-100 MG tablet Commonly known as: SINEMET IR Take 1 tablet by mouth 3 (three) times daily.   irbesartan 75 MG tablet Commonly known as: AVAPRO Take 1 tablet (75 mg total) by mouth daily.   pantoprazole 40 MG tablet Commonly known as: PROTONIX Take 1 tablet (40 mg total) by mouth every other day.   Vitamin D3 50 MCG (2000 UT) Tabs Take 2,000 Units by mouth daily.         Objective:   BP 133/73   Pulse 72   Temp 98.2 F (36.8 C) (Temporal)   Ht _0  (1.626 m)   Wt 195 lb (88.5 kg)   SpO2 100%   BMI 33.47 kg/m   Wt Readings  from Last 3 Encounters:  04/02/21 195 lb (88.5 kg)  12/21/20 197 lb (89.4 kg)  10/18/20 197 lb (89.4 kg)    Physical Exam Vitals and nursing note reviewed.  Constitutional:      General: He is not in acute distress.    Appearance: He is well-developed. He is not diaphoretic.  Eyes:     General: No scleral icterus.       Right eye: No discharge.     Conjunctiva/sclera: Conjunctivae normal.  Neck:     Thyroid: No thyromegaly.  Cardiovascular:     Rate and Rhythm: Normal rate and regular rhythm.     Heart sounds: Normal heart sounds. No murmur heard. Pulmonary:     Effort: Pulmonary effort is normal. No respiratory distress.     Breath sounds: Normal breath sounds. No wheezing.  Musculoskeletal:     Right shoulder: Tenderness (Pain with abduction and extension) present. No bony tenderness. Decreased range of motion.     Cervical back: Neck supple.     Right knee: Crepitus present. No LCL laxity, MCL laxity, ACL laxity or PCL laxity. Normal meniscus.     Instability Tests: Medial McMurray test positive. Lateral McMurray test negative.  Lymphadenopathy:     Cervical: No cervical adenopathy.  Skin:    General: Skin is warm and dry.     Findings: No rash.  Neurological:     Mental Status: He is alert and oriented to person, place, and time.     Coordination: Coordination normal.  Psychiatric:        Behavior: Behavior normal.    Results for orders placed or performed in visit on 12/21/20  Bayer DCA Hb A1c Waived  Result Value Ref Range   HB A1C (BAYER DCA - WAIVED) 6.3 <7.0 %    Assessment & Plan:   Problem List Items Addressed This Visit       Cardiovascular and Mediastinum   Hypertension associated with diabetes ([place]) - Primary (Chronic)   Relevant Orders   CMP14+EGFR     Digestive   GERD (gastroesophageal reflux disease)   Relevant Orders   CBC with Differential/Platelet     Endocrine   Hyperlipidemia associated with type 2 diabetes mellitus ([place])   Relevant  Orders   Lipid panel   Type 2 diabetes mellitus with other specified complication ([place])   Relevant Orders   Bayer DCA Hb A1c Waived   Other Visit Diagnoses     Fall, initial encounter       Relevant Orders   DG Shoulder Right   DG Knee 1-2 Views Right       Will send patient for x-ray for shoulder, await final read from radiology.  We will do lab work on  the way out as well.  Recommended home stretching and exercise and anti-inflammatories and if does not improve also consider physical therapy and possible orthopedic if not improving.  Follow up plan: Return in about 3 months (around 07/03/2021), or if symptoms worsen or fail to improve, for Diabetes recheck.  Counseling provided for all of the vaccine components Orders Placed This Encounter  Procedures   DG Shoulder Right   DG Knee 1-2 Views Right   Bayer DCA Hb A1c Waived   CBC with Differential/Platelet   CMP14+EGFR   Lipid panel    Caryl Pina, MD [facility] 04/02/2021, 8:20 AM

## 2021-04-03 LAB — CBC WITH DIFFERENTIAL/PLATELET
Basophils Absolute: 0 10*3/uL (ref 0.0–0.2)
Basos: 1 %
EOS (ABSOLUTE): 0.1 10*3/uL (ref 0.0–0.4)
Eos: 2 %
Hematocrit: 44.6 % (ref 37.5–51.0)
Hemoglobin: 14.8 g/dL (ref 13.0–17.7)
Immature Grans (Abs): 0 10*3/uL (ref 0.0–0.1)
Immature Granulocytes: 0 %
Lymphocytes Absolute: 2.9 10*3/uL (ref 0.7–3.1)
Lymphs: 40 %
MCH: 31.8 pg (ref 26.6–33.0)
MCHC: 33.2 g/dL (ref 31.5–35.7)
MCV: 96 fL (ref 79–97)
Monocytes Absolute: 0.7 10*3/uL (ref 0.1–0.9)
Monocytes: 10 %
Neutrophils Absolute: 3.4 10*3/uL (ref 1.4–7.0)
Neutrophils: 47 %
Platelets: 335 10*3/uL (ref 150–450)
RBC: 4.66 x10E6/uL (ref 4.14–5.80)
RDW: 12.2 % (ref 11.6–15.4)
WBC: 7.3 10*3/uL (ref 3.4–10.8)

## 2021-04-03 LAB — CMP14+EGFR
ALT: 22 IU/L (ref 0–44)
AST: 22 IU/L (ref 0–40)
Albumin/Globulin Ratio: 1.6 (ref 1.2–2.2)
Albumin: 4.6 g/dL (ref 3.7–4.7)
Alkaline Phosphatase: 105 IU/L (ref 44–121)
BUN/Creatinine Ratio: 15 (ref 10–24)
BUN: 15 mg/dL (ref 8–27)
Bilirubin Total: 0.6 mg/dL (ref 0.0–1.2)
CO2: 23 mmol/L (ref 20–29)
Calcium: 9.6 mg/dL (ref 8.6–10.2)
Chloride: 100 mmol/L (ref 96–106)
Creatinine, Ser: 0.97 mg/dL (ref 0.76–1.27)
Globulin, Total: 2.8 g/dL (ref 1.5–4.5)
Glucose: 118 mg/dL — ABNORMAL HIGH (ref 65–99)
Potassium: 4.1 mmol/L (ref 3.5–5.2)
Sodium: 140 mmol/L (ref 134–144)
Total Protein: 7.4 g/dL (ref 6.0–8.5)
eGFR: 79 mL/min/{1.73_m2} (ref >59)

## 2021-04-03 LAB — LIPID PANEL
Chol/HDL Ratio: 2.9 ratio (ref 0.0–5.0)
Cholesterol, Total: 157 mg/dL (ref 100–199)
HDL: 55 mg/dL (ref >39)
LDL Chol Calc (NIH): 82 mg/dL (ref 0–99)
Triglycerides: 114 mg/dL (ref 0–149)
VLDL Cholesterol Cal: 20 mg/dL (ref 5–40)

## 2021-04-17 NOTE — Progress Notes (Signed)
Assessment/Plan:   1.  Parkinsons Disease  -increase carbidopa/levodopa 25/100, 1.5 tablet at 8 AM/noon/4 PM.    -We discussed that it used to be thought that levodopa would increase risk of melanoma but now it is believed that Parkinsons itself likely increases risk of melanoma. he is to get regular skin checks.  2.  R arm pain  -concerned and wanted pt to see ortho/sports med.  He refused.  We will reach out to primary care.  I know the patient has seen primary care, but just want him to the degree of pain the patient is still in.   Subjective:   Paul [name] was seen today in follow up for Parkinsons disease.  My previous records were reviewed prior to todays visit as well as outside records available to me. Pt with wife who supplements hx. last visit, patient was a bit underdosed, but he did not want to change his medications.  Reports today that he is doing fairly well.  Did have a fall recently.  He was working on his deck and fell through one of the boards that was weak and hurt his right knee and shoulder.  He did hit head on a table but didn't hurt that.  Still has significant pain mid humerus.  Does state it is getting better.  The knee pain is definitely getting better.  No other falls.  He is having tremor.   No lightheadedness or near syncope.  Saw patient's primary care physician on August 29.  Current prescribed movement disorder medications: Carbidopa/levodopa 25/100, 1 tablet 3 times per day.   ALLERGIES:   Allergies  Allergen Reactions   Sulfonamide Derivatives     REACTION: rash    CURRENT MEDICATIONS:  Outpatient Encounter Medications as of 04/18/2021  Medication Sig   amLODipine (NORVASC) 5 MG tablet Take 1 tablet (5 mg total) by mouth daily.   aspirin 81 MG tablet Take 81 mg by mouth daily.   atorvastatin (LIPITOR) 40 MG tablet Take 1 tablet (40 mg total) by mouth daily.   carbidopa-levodopa (SINEMET IR) 25-100 MG tablet Take 1 tablet by mouth 3 (three)  times daily.   Cholecalciferol (VITAMIN D3) 2000 UNITS TABS Take 2,000 Units by mouth daily.   irbesartan (AVAPRO) 75 MG tablet Take 1 tablet (75 mg total) by mouth daily.   pantoprazole (PROTONIX) 40 MG tablet Take 1 tablet (40 mg total) by mouth every other day.   No facility-administered encounter medications on file as of 04/18/2021.    Objective:   PHYSICAL EXAMINATION:    VITALS:   Vitals:   04/18/21 1005  BP: 126/62  Pulse: 92  SpO2: 97%  Weight: 197 lb 6.4 oz (89.5 kg)  Height: '5\' 7"'$  (1.702 m)     GEN:  The patient appears stated age and is in NAD. HEENT:  Normocephalic, atraumatic.  The mucous membranes are moist. The superficial temporal arteries are without ropiness or tenderness. CV:  RRR Lungs:  CTAB Neck/HEME:  There are no carotid bruits bilaterally.  Neurological examination:  Orientation: The patient is alert and oriented x3. Cranial nerves: There is good facial symmetry with facial hypomimia. The speech is fluent and clear. Soft palate rises symmetrically and there is no tongue deviation. Hearing is intact to conversational tone. Sensation: Sensation is intact to light touch throughout Motor: Strength is at least antigravity x4.  However, he has significant pain with just touching the right arm, even when touching it more distally (he mentioned someone  tried to shake his hand a little bit vigorously and it caused significant pain).  When I touched him over the mid humeral shaft, he had significant pain.  He has trouble raising her right arm up beyond about 45 degrees.  Movement examination: Tone: There is mod increased tone in the RUE Abnormal movements: there is RUE rest tremor, overall mild. Coordination:  There is no significant decremation with RAM's, with any form of RAMS, including alternating supination and pronation of the forearm, hand opening and closing, finger taps, heel taps and toe taps. Gait and Station: The patient has min difficulty arising  out of a deep-seated chair without the use of the hands. The patient's stride length is good.  He has almost no arm swing on the right, but part of that is because he is protecting the right arm because of pain.  I have reviewed and interpreted the following labs independently    Chemistry      Component Value Date/Time   NA 140 04/02/2021 0853   NA 141 04/06/2013 0956   K 4.1 04/02/2021 0853   K 4.1 04/06/2013 0956   CL 100 04/02/2021 0853   CL 106 10/16/2012 1022   CO2 23 04/02/2021 0853   CO2 24 04/06/2013 0956   BUN 15 04/02/2021 0853   BUN 16.7 04/06/2013 0956   CREATININE 0.97 04/02/2021 0853   CREATININE 1.0 04/06/2013 0956      Component Value Date/Time   CALCIUM 9.6 04/02/2021 0853   CALCIUM 9.7 04/06/2013 0956   ALKPHOS 105 04/02/2021 0853   ALKPHOS 101 04/06/2013 0956   AST 22 04/02/2021 0853   AST 24 04/06/2013 0956   ALT 22 04/02/2021 0853   ALT 23 04/06/2013 0956   BILITOT 0.6 04/02/2021 0853   BILITOT 0.34 04/06/2013 0956       Lab Results  Component Value Date   WBC 7.3 04/02/2021   HGB 14.8 04/02/2021   HCT 44.6 04/02/2021   MCV 96 04/02/2021   PLT 335 04/02/2021    Lab Results  Component Value Date   TSH 1.89 12/03/2017     Total time spent on today's visit was 31 minutes, including both face-to-face time and nonface-to-face time.  Time included that spent on review of records (prior notes available to me/labs/imaging if pertinent), discussing treatment and goals, answering patient's questions and coordinating care.  Cc:  Dettinger, Fransisca Kaufmann, MD

## 2021-04-18 ENCOUNTER — Other Ambulatory Visit: Payer: Self-pay

## 2021-04-18 ENCOUNTER — Encounter: Payer: Self-pay | Admitting: Neurology

## 2021-04-18 ENCOUNTER — Ambulatory Visit: Payer: Medicare Other | Admitting: Neurology

## 2021-04-18 VITALS — BP 126/62 | HR 92 | Ht 67.0 in | Wt 197.4 lb

## 2021-04-18 DIAGNOSIS — M79601 Pain in right arm: Secondary | ICD-10-CM

## 2021-04-18 DIAGNOSIS — G20 Parkinson's disease: Secondary | ICD-10-CM

## 2021-04-18 MED ORDER — CARBIDOPA-LEVODOPA 25-100 MG PO TABS: 1.5000 | ORAL_TABLET | Freq: Three times a day (TID) | ORAL | 1 refills | Status: DC

## 2021-04-18 NOTE — Patient Instructions (Addendum)
Increase carbidopa/levodopa 25/100 to 1.5 tablets three times per day  The physicians and staff at [facility] are committed to providing excellent care. You may receive a survey requesting feedback about your experience at our office. We strive to receive "very good" responses to the survey questions. If you feel that your experience would prevent you from giving the office a "very good " response, please contact our office to try to remedy the situation. We may be reached at [phone]. Thank you for taking the time out of your busy day to complete the survey.

## 2021-04-24 ENCOUNTER — Other Ambulatory Visit: Payer: Self-pay | Admitting: Family Medicine

## 2021-04-27 ENCOUNTER — Ambulatory Visit: Payer: Medicare Other | Admitting: Neurology

## 2021-05-24 DIAGNOSIS — G20 Parkinson's disease: Secondary | ICD-10-CM | POA: Diagnosis not present

## 2021-05-24 DIAGNOSIS — N200 Calculus of kidney: Secondary | ICD-10-CM | POA: Diagnosis not present

## 2021-05-24 DIAGNOSIS — C61 Malignant neoplasm of prostate: Secondary | ICD-10-CM | POA: Diagnosis not present

## 2021-05-30 ENCOUNTER — Ambulatory Visit (INDEPENDENT_AMBULATORY_CARE_PROVIDER_SITE_OTHER): Payer: Medicare Other

## 2021-05-30 ENCOUNTER — Other Ambulatory Visit: Payer: Self-pay

## 2021-05-30 DIAGNOSIS — Z23 Encounter for immunization: Secondary | ICD-10-CM

## 2021-06-01 ENCOUNTER — Telehealth: Payer: Self-pay | Admitting: Neurology

## 2021-06-01 ENCOUNTER — Other Ambulatory Visit: Payer: Self-pay

## 2021-06-01 NOTE — Telephone Encounter (Signed)
Patient is going to contact [facility] regarding refill.

## 2021-06-01 NOTE — Telephone Encounter (Signed)
Pt needs a refill on  his carbiodpa/levo 25-100mg  sent to [facility] in [place]

## 2021-06-12 ENCOUNTER — Other Ambulatory Visit: Payer: Self-pay | Admitting: Family Medicine

## 2021-06-12 DIAGNOSIS — I152 Hypertension secondary to endocrine disorders: Secondary | ICD-10-CM

## 2021-07-04 ENCOUNTER — Ambulatory Visit (INDEPENDENT_AMBULATORY_CARE_PROVIDER_SITE_OTHER): Payer: Medicare Other | Admitting: Family Medicine

## 2021-07-04 ENCOUNTER — Encounter: Payer: Self-pay | Admitting: Family Medicine

## 2021-07-04 VITALS — BP 126/68 | HR 73 | Ht 67.0 in | Wt 193.0 lb

## 2021-07-04 DIAGNOSIS — E785 Hyperlipidemia, unspecified: Secondary | ICD-10-CM

## 2021-07-04 DIAGNOSIS — E1159 Type 2 diabetes mellitus with other circulatory complications: Secondary | ICD-10-CM | POA: Diagnosis not present

## 2021-07-04 DIAGNOSIS — I152 Hypertension secondary to endocrine disorders: Secondary | ICD-10-CM | POA: Diagnosis not present

## 2021-07-04 DIAGNOSIS — E1169 Type 2 diabetes mellitus with other specified complication: Secondary | ICD-10-CM

## 2021-07-04 LAB — BAYER DCA HB A1C WAIVED: HB A1C (BAYER DCA - WAIVED): 6.1 % — ABNORMAL HIGH (ref 4.8–5.6)

## 2021-07-04 MED ORDER — IRBESARTAN 75 MG PO TABS: 75.0000 mg | ORAL_TABLET | Freq: Every day | ORAL | 3 refills | Status: DC

## 2021-07-04 MED ORDER — AMLODIPINE BESYLATE 5 MG PO TABS: 5.0000 mg | ORAL_TABLET | Freq: Every day | ORAL | 3 refills | Status: DC

## 2021-07-04 NOTE — Progress Notes (Signed)
BP 126/68   Pulse 73   Ht 5\' 7"  (1.702 m)   Wt 193 lb (87.5 kg)   SpO2 96%   BMI 30.23 kg/m    Subjective:   Patient ID: Paul [name], male    DOB: 1940-12-26, 80 y.o.   MRN: [id number]  HPI: Paul [name] is a 80 y.o. male presenting on 07/04/2021 for Medical Management of Chronic Issues, Diabetes, and Hypertension   HPI Type 2 diabetes mellitus Patient comes in today for recheck of his diabetes. Patient has been currently taking no medication, A1c looks good at 6.1. Patient is currently on an ACE inhibitor/ARB. Patient has not seen an ophthalmologist this year. Patient denies any issues with their feet. The symptom started onset as an adult hypertension and hyperlipidemia ARE RELATED TO DM   Hypertension Patient is currently on amlodipine and irbesartan, and their blood pressure today is 126/68. Patient denies any lightheadedness or dizziness. Patient denies headaches, blurred vision, chest pains, shortness of breath, or weakness. Denies any side effects from medication and is content with current medication.   Hyperlipidemia Patient is coming in for recheck of his hyperlipidemia. The patient is currently taking atorvastatin. They deny any issues with myalgias or history of liver damage from it. They deny any focal numbness or weakness or chest pain.   Patient has Parkinson's disease and sees Dr. Denna Haggard neurology and they recently increased his medicine.  He says his only real side effect for anything uses have a little more constipation and using Dulcolax and sometimes it works and sometimes it does not.  Relevant past medical, surgical, family and social history reviewed and updated as indicated. Interim medical history since our last visit reviewed. Allergies and medications reviewed and updated.  Review of Systems  Constitutional:  Negative for chills and fever.  Respiratory:  Negative for shortness of breath and wheezing.   Cardiovascular:  Negative for chest pain and  leg swelling.  Gastrointestinal:  Positive for constipation. Negative for blood in stool, diarrhea and vomiting.  Musculoskeletal:  Negative for back pain and gait problem.  Skin:  Negative for rash.  All other systems reviewed and are negative.  Per HPI unless specifically indicated above   Allergies as of 07/04/2021       Reactions   Sulfonamide Derivatives    REACTION: rash        Medication List        Accurate as of July 04, 2021  8:35 AM. If you have any questions, ask your nurse or doctor.          amLODipine 5 MG tablet Commonly known as: NORVASC Take 1 tablet (5 mg total) by mouth daily.   aspirin 81 MG tablet Take 81 mg by mouth daily.   atorvastatin 40 MG tablet Commonly known as: LIPITOR Take 1 tablet (40 mg total) by mouth daily.   carbidopa-levodopa 25-100 MG tablet Commonly known as: SINEMET IR Take 1.5 tablets by mouth 3 (three) times daily.   irbesartan 75 MG tablet Commonly known as: AVAPRO Take 1 tablet (75 mg total) by mouth daily.   pantoprazole 40 MG tablet Commonly known as: PROTONIX Take 1 tablet (40 mg total) by mouth every other day.   Vitamin D3 50 MCG (2000 UT) Tabs Take 2,000 Units by mouth daily.         Objective:   BP 126/68   Pulse 73   Ht 5\' 7"  (1.702 m)   Wt 193 lb (87.5 kg)  SpO2 96%   BMI 30.23 kg/m   Wt Readings from Last 3 Encounters:  07/04/21 193 lb (87.5 kg)  04/18/21 197 lb 6.4 oz (89.5 kg)  04/02/21 195 lb (88.5 kg)    Physical Exam Vitals and nursing note reviewed.  Constitutional:      General: He is not in acute distress.    Appearance: He is well-developed. He is not diaphoretic.  Eyes:     General: No scleral icterus.    Conjunctiva/sclera: Conjunctivae normal.  Neck:     Thyroid: No thyromegaly.  Cardiovascular:     Rate and Rhythm: Normal rate and regular rhythm.     Heart sounds: Normal heart sounds. No murmur heard. Pulmonary:     Effort: Pulmonary effort is normal. No  respiratory distress.     Breath sounds: Normal breath sounds. No wheezing.  Musculoskeletal:        General: No swelling. Normal range of motion.     Cervical back: Neck supple.  Lymphadenopathy:     Cervical: No cervical adenopathy.  Skin:    General: Skin is warm and dry.     Findings: No rash.  Neurological:     Mental Status: He is alert and oriented to person, place, and time.     Motor: Tremor present.     Coordination: Coordination normal.  Psychiatric:        Behavior: Behavior normal.      Assessment & Plan:   Problem List Items Addressed This Visit       Cardiovascular and Mediastinum   Hypertension associated with diabetes ([place]) (Chronic)   Relevant Medications   irbesartan (AVAPRO) 75 MG tablet   amLODipine (NORVASC) 5 MG tablet     Endocrine   Hyperlipidemia associated with type 2 diabetes mellitus (HCC)   Relevant Medications   irbesartan (AVAPRO) 75 MG tablet   amLODipine (NORVASC) 5 MG tablet   Type 2 diabetes mellitus with other specified complication (HCC) - Primary   Relevant Medications   irbesartan (AVAPRO) 75 MG tablet   Other Relevant Orders   Bayer DCA Hb A1c Waived    A1c looks good, continue current medicine. Follow up plan: Return in about 3 months (around 10/02/2021), or if symptoms worsen or fail to improve, for Diabetes and hypertension and cholesterol recheck.  Counseling provided for all of the vaccine components Orders Placed This Encounter  Procedures   Bayer [place] Hb A1c [place] Myrth Dahan, MD [facility] 07/04/2021, 8:35 AM

## 2021-07-16 ENCOUNTER — Ambulatory Visit (INDEPENDENT_AMBULATORY_CARE_PROVIDER_SITE_OTHER): Payer: Medicare Other

## 2021-07-16 VITALS — Ht 67.0 in | Wt 193.0 lb

## 2021-07-16 DIAGNOSIS — Z0000 Encounter for general adult medical examination without abnormal findings: Secondary | ICD-10-CM

## 2021-07-16 NOTE — Patient Instructions (Signed)
Paul [name] , Thank you for taking time to come for your Medicare Wellness Visit. I appreciate your ongoing commitment to your health goals. Please review the following plan we discussed and let me know if I can assist you in the future.   Screening recommendations/referrals: Colonoscopy: Done 08/13/2013 - no repeat Recommended yearly ophthalmology/optometry visit for glaucoma screening and checkup Recommended yearly dental visit for hygiene and checkup  Vaccinations: Influenza vaccine: Done 05/30/2021 - Repeat annually  Pneumococcal vaccine: Done 07/23/2013 & 11/20/2016 Tdap vaccine: Done 11/23/2013 - Repeat in 10 years Shingles vaccine: Done 06/26/2018 & 09/04/2018   Covid-19: Done 09/03/2019, 09/25/2019, 05/31/2020 & 05/23/2021  Advanced directives: Please bring a copy of your health care power of attorney and living will to the office to be added to your chart at your convenience.   Conditions/risks identified: Aim for 30 minutes of exercise or brisk walking each day, drink 6-8 glasses of water and eat lots of fruits and vegetables.   Next appointment: Follow up in one year for your annual wellness visit.   Preventive Care 8 Years and Older, Male  Preventive care refers to lifestyle choices and visits with your health care provider that can promote health and wellness. What does preventive care include? A yearly physical exam. This is also called an annual well check. Dental exams once or twice a year. Routine eye exams. Ask your health care provider how often you should have your eyes checked. Personal lifestyle choices, including: Daily care of your teeth and gums. Regular physical activity. Eating a healthy diet. Avoiding tobacco and drug use. Limiting alcohol use. Practicing safe sex. Taking low doses of aspirin every day. Taking vitamin and mineral supplements as recommended by your health care provider. What happens during an annual well check? The services and screenings done  by your health care provider during your annual well check will depend on your age, overall health, lifestyle risk factors, and family history of disease. Counseling  Your health care provider may ask you questions about your: Alcohol use. Tobacco use. Drug use. Emotional well-being. Home and relationship well-being. Sexual activity. Eating habits. History of falls. Memory and ability to understand (cognition). Work and work Statistician. Screening  You may have the following tests or measurements: Height, weight, and BMI. Blood pressure. Lipid and cholesterol levels. These may be checked every 5 years, or more frequently if you are over 61 years old. Skin check. Lung cancer screening. You may have this screening every year starting at age 76 if you have a 30-pack-year history of smoking and currently smoke or have quit within the past 15 years. Fecal occult blood test (FOBT) of the stool. You may have this test every year starting at age 68. Flexible sigmoidoscopy or colonoscopy. You may have a sigmoidoscopy every 5 years or a colonoscopy every 10 years starting at age 4. Prostate cancer screening. Recommendations will vary depending on your family history and other risks. Hepatitis C blood test. Hepatitis B blood test. Sexually transmitted disease (STD) testing. Diabetes screening. This is done by checking your blood sugar (glucose) after you have not eaten for a while (fasting). You may have this done every 1-3 years. Abdominal aortic aneurysm (AAA) screening. You may need this if you are a current or former smoker. Osteoporosis. You may be screened starting at age 85 if you are at high risk. Talk with your health care provider about your test results, treatment options, and if necessary, the need for more tests. Vaccines  Your health  care provider may recommend certain vaccines, such as: Influenza vaccine. This is recommended every year. Tetanus, diphtheria, and acellular  pertussis (Tdap, Td) vaccine. You may need a Td booster every 10 years. Zoster vaccine. You may need this after age 34. Pneumococcal 13-valent conjugate (PCV13) vaccine. One dose is recommended after age 24. Pneumococcal polysaccharide (PPSV23) vaccine. One dose is recommended after age 22. Talk to your health care provider about which screenings and vaccines you need and how often you need them. This information is not intended to replace advice given to you by your health care provider. Make sure you discuss any questions you have with your health care provider. Document Released: 08/18/2015 Document Revised: 04/10/2016 Document Reviewed: 05/23/2015 Elsevier Interactive Patient Education  2017 [employer] Prevention in the Home Falls can cause injuries. They can happen to people of all ages. There are many things you can do to make your home safe and to help prevent falls. What can I do on the outside of my home? Regularly fix the edges of walkways and driveways and fix any cracks. Remove anything that might make you trip as you walk through a door, such as a raised step or threshold. Trim any bushes or trees on the path to your home. Use bright outdoor lighting. Clear any walking paths of anything that might make someone trip, such as rocks or tools. Regularly check to see if handrails are loose or broken. Make sure that both sides of any steps have handrails. Any raised decks and porches should have guardrails on the edges. Have any leaves, snow, or ice cleared regularly. Use sand or salt on walking paths during winter. Clean up any spills in your garage right away. This includes oil or grease spills. What can I do in the bathroom? Use night lights. Install grab bars by the toilet and in the tub and shower. Do not use towel bars as grab bars. Use non-skid mats or decals in the tub or shower. If you need to sit down in the shower, use a plastic, non-slip stool. Keep the floor  dry. Clean up any water that spills on the floor as soon as it happens. Remove soap buildup in the tub or shower regularly. Attach bath mats securely with double-sided non-slip rug tape. Do not have throw rugs and other things on the floor that can make you trip. What can I do in the bedroom? Use night lights. Make sure that you have a light by your bed that is easy to reach. Do not use any sheets or blankets that are too big for your bed. They should not hang down onto the floor. Have a firm chair that has side arms. You can use this for support while you get dressed. Do not have throw rugs and other things on the floor that can make you trip. What can I do in the kitchen? Clean up any spills right away. Avoid walking on wet floors. Keep items that you use a lot in easy-to-reach places. If you need to reach something above you, use a strong step stool that has a grab bar. Keep electrical cords out of the way. Do not use floor polish or wax that makes floors slippery. If you must use wax, use non-skid floor wax. Do not have throw rugs and other things on the floor that can make you trip. What can I do with my stairs? Do not leave any items on the stairs. Make sure that there are handrails on  both sides of the stairs and use them. Fix handrails that are broken or loose. Make sure that handrails are as long as the stairways. Check any carpeting to make sure that it is firmly attached to the stairs. Fix any carpet that is loose or worn. Avoid having throw rugs at the top or bottom of the stairs. If you do have throw rugs, attach them to the floor with carpet tape. Make sure that you have a light switch at the top of the stairs and the bottom of the stairs. If you do not have them, ask someone to add them for you. What else can I do to help prevent falls? Wear shoes that: Do not have high heels. Have rubber bottoms. Are comfortable and fit you well. Are closed at the toe. Do not wear  sandals. If you use a stepladder: Make sure that it is fully opened. Do not climb a closed stepladder. Make sure that both sides of the stepladder are locked into place. Ask someone to hold it for you, if possible. Clearly mark and make sure that you can see: Any grab bars or handrails. First and last steps. Where the edge of each step is. Use tools that help you move around (mobility aids) if they are needed. These include: Canes. Walkers. Scooters. Crutches. Turn on the lights when you go into a dark area. Replace any light bulbs as soon as they burn out. Set up your furniture so you have a clear path. Avoid moving your furniture around. If any of your floors are uneven, fix them. If there are any pets around you, be aware of where they are. Review your medicines with your doctor. Some medicines can make you feel dizzy. This can increase your chance of falling. Ask your doctor what other things that you can do to help prevent falls. This information is not intended to replace advice given to you by your health care provider. Make sure you discuss any questions you have with your health care provider. Document Released: 05/18/2009 Document Revised: 12/28/2015 Document Reviewed: 08/26/2014 Elsevier Interactive Patient Education  2017 [employer].

## 2021-07-16 NOTE — Progress Notes (Signed)
Subjective:   Paul [name] is a 80 y.o. male who presents for Medicare Annual/Subsequent preventive examination.  Virtual Visit via Telephone Note  I connected with  Paul [name] on 07/16/21 at 10:30 AM EST by telephone and verified that I am speaking with the correct person using two identifiers.  Location: Patient: Home Provider: [facility] Persons participating in the virtual visit: patient/Nurse Health Advisor   I discussed the limitations, risks, security and privacy concerns of performing an evaluation and management service by telephone and the availability of in person appointments. The patient expressed understanding and agreed to proceed.  Interactive audio and video telecommunications were attempted between this nurse and patient, however failed, due to patient having technical difficulties OR patient did not have access to video capability.  We continued and completed visit with audio only.  Some vital signs may be absent or patient reported.   Gelsey Amyx E Dawon Troop, LPN   Review of Systems     Cardiac Risk Factors include: advanced age (>76men, >56 women);diabetes mellitus;dyslipidemia;hypertension;male gender;sedentary lifestyle;obesity (BMI >30kg/m2)     Objective:    Today's Vitals   07/16/21 1036  Weight: 193 lb (87.5 kg)  Height: 5\' 7"  (1.702 m)   Body mass index is 30.23 kg/m.  Advanced Directives 07/16/2021 04/18/2021 10/18/2020 07/14/2020 04/19/2020 10/12/2019 06/28/2019  Does Patient Have a Medical Advance Directive? Yes Yes Yes Yes Yes Yes Yes  Type of Paramedic of [place];Living will Living will [place];Living will [place];Living will Living will;Healthcare Power of [place];Living will  Does patient want to make changes to medical advance directive? - - - No - Patient declined - - -  Copy of [place] in Chart? No - copy requested - - No - copy  requested - - No - copy requested  Would patient like information on creating a medical advance directive? - - - - - - -    Current Medications (verified) Outpatient Encounter Medications as of 07/16/2021  Medication Sig   amLODipine (NORVASC) 5 MG tablet Take 1 tablet (5 mg total) by mouth daily.   aspirin 81 MG tablet Take 81 mg by mouth daily.   atorvastatin (LIPITOR) 40 MG tablet Take 1 tablet (40 mg total) by mouth daily.   carbidopa-levodopa (SINEMET IR) 25-100 MG tablet Take 1.5 tablets by mouth 3 (three) times daily.   Cholecalciferol (VITAMIN D3) 2000 UNITS TABS Take 2,000 Units by mouth daily.   irbesartan (AVAPRO) 75 MG tablet Take 1 tablet (75 mg total) by mouth daily.   pantoprazole (PROTONIX) 40 MG tablet Take 1 tablet (40 mg total) by mouth every other day.   No facility-administered encounter medications on file as of 07/16/2021.    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; s/p prostatectomy; involving both lobes; No extraprostatic involvement; neg margin;  pT2c, pN0, M0    Diabetes mellitus without complication (HCC)    Full dentures    does not wear bottoms   GERD (gastroesophageal reflux disease)    Hyperlipidemia    Hypertension    Iron deficiency anemia    presumped malabsorption; neg EGD/ capsule endos/colosopcy   Iron deficiency anemia, unspecified 10/16/2012   Kidney stones    Parkinson's disease [facility])    Past Surgical History:  Procedure Laterality Date   COLONOSCOPY     HEMORRHOID SURGERY  N/A 07/05/2014   Procedure: HEMORRHOIDECTOMY;  Surgeon: Erroll Luna, MD;  Location: [facility];  Service: General;  Laterality: N/A;   ORIF RADIAL FRACTURE     right-as child   robtic assisted laparoscopic     radical prostatectomy and bilateral pelvic lymphadenectomy   UPPER GI ENDOSCOPY     Family History  Problem Relation Age of Onset    Heart failure Mother    Diabetes type II Mother    Diabetes Mother    Heart attack Father    Diabetes Brother    Diabetes Sister    Cancer Sister        colon   Diabetes Sister    Diabetes Brother    Cancer Brother        bone and leukemia   Diabetes Brother    Diabetes Brother    Cancer Brother    Healthy Son        over [employer]    Social History   Socioeconomic History   Marital status: Married    Spouse name: Not on file   Number of children: 1   Years of education: 10   Highest education level: 10th grade  Occupational History   Occupation: retired    Comment: Architect work  Tobacco Use   Smoking status: Former    Packs/day: 1.00    Years: 25.00    Pack years: 25.00    Types: Cigarettes    Quit date: 09/18/1981    Years since quitting: 39.8   Smokeless tobacco: Never  Vaping Use   Vaping Use: Never used  Substance and Sexual Activity   Alcohol use: No   Drug use: No   Sexual activity: Yes  Other Topics Concern   Not on file  Social History Narrative   06/28/2019 Mr Gartrell lives at home with his wife of 28 years. 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 His son and 3 grandchildren live down the road. They still see them often.    Social Determinants of Health   Financial Resource Strain: Low Risk    Difficulty of Paying Living Expenses: Not hard at all  Food Insecurity: No Food Insecurity   Worried About Charity fundraiser in the Last Year: Never true   [place] in the Last Year: Never true  Transportation Needs: No Transportation Needs   Lack of Transportation (Medical): No   Lack of Transportation (Non-Medical): No  Physical Activity: Inactive   Days of Exercise per Week: 0 days   Minutes of Exercise per Session: 0 min  Stress: No Stress Concern Present   Feeling of Stress : Not at all  Social Connections: Moderately Integrated   Frequency of Communication with Friends and Family:  More than three times a week   Frequency of Social Gatherings with Friends and Family: More than three times a week   Attends Religious Services: More than 4 times per year   Active Member of [employer] or Organizations: No   Attends Music therapist: Never   Marital Status: Married    Tobacco Counseling Counseling given: Not Answered   Clinical Intake:  Pre-visit preparation completed: Yes  Pain : No/denies pain     BMI - recorded: 30.23 Nutritional Status: BMI > 30  Obese Nutritional Risks: None Diabetes: Yes CBG done?: No Did pt. bring in CBG monitor from home?: No  How often do you need  to have someone help you when you read instructions, pamphlets, or other written materials from your doctor or pharmacy?: 1 - Never  Diabetic? Nutrition Risk Assessment:  Has the patient had any N/V/D within the last 2 months?  No  Does the patient have any non-healing wounds?  No  Has the patient had any unintentional weight loss or weight gain?  No   Diabetes:  Is the patient diabetic?  Yes  If diabetic, was a CBG obtained today?  No  Did the patient bring in their glucometer from home?  No  How often do you monitor your CBG's? never.   Financial Strains and Diabetes Management:  Are you having any financial strains with the device, your supplies or your medication? No .  Does the patient want to be seen by Chronic Care Management for management of their diabetes?  No  Would the patient like to be referred to a Nutritionist or for Diabetic Management?  No   Diabetic Exams:  Diabetic Eye Exam: Completed 12/2020  Diabetic Foot Exam: Completed 12/21/2020. Pt has been advised about the importance in completing this exam. Pt is scheduled for diabetic foot exam on next year.    Interpreter Needed?: No  Information entered by :: Tieasha Larsen, LPN   Activities of Daily Living In your present state of health, do you have any difficulty performing the following activities:  07/16/2021  Hearing? N  Vision? N  Difficulty concentrating or making decisions? N  Walking or climbing stairs? N  Dressing or bathing? N  Doing errands, shopping? N  Preparing Food and eating ? N  Using the Toilet? N  In the past six months, have you accidently leaked urine? N  Do you have problems with loss of bowel control? N  Managing your Medications? N  Managing your Finances? N  Housekeeping or managing your Housekeeping? N  Some recent data might be hidden    Patient Care Team: Dettinger, Fransisca Kaufmann, MD as PCP - General (Family Medicine) Elease Etienne as Physician Assistant (Physician Assistant) Chipper Herb, MD (Inactive) as Attending Physician (Family Medicine) Myrlene Broker, MD as Attending Physician (Urology) Tat, Eustace Quail, DO as Consulting Physician (Neurology)  Indicate any recent Medical Services you may have received from other than [facility] providers in the past year (date may be approximate).     Assessment:   This is a routine wellness examination for Paul [name].  Hearing/Vision screen Hearing Screening - Comments:: Denies hearing difficulties  Vision Screening - Comments:: Wears otc reading glasses prn only - up to date with annual eye exams with [facility]  Dietary issues and exercise activities discussed: Current Exercise Habits: The patient does not participate in regular exercise at present, Type of exercise: walking, Time (Minutes): 10, Frequency (Times/Week): 7, Weekly Exercise (Minutes/Week): 70, Intensity: Mild, Exercise limited by: neurologic condition(s);orthopedic condition(s)   Goals Addressed             This Visit's Progress    Exercise 150 minutes per week (moderate activity)   Not on track    07/14/2020 AWV Goal: Exercise for General Health  Patient will verbalize understanding of the benefits of increased physical activity: Exercising regularly is important. It will improve your overall fitness, flexibility, and  endurance. Regular exercise also will improve your overall health. It can help you control your weight, reduce stress, and improve your bone density. Over the next year, patient will increase physical activity as tolerated with a goal of at  least 150 minutes of moderate physical activity per week.  You can tell that you are exercising at a moderate intensity if your heart starts beating faster and you start breathing faster but can still hold a conversation. Moderate-intensity exercise ideas include: Walking 1 mile (1.6 km) in about 15 minutes Biking Hiking Golfing Dancing Water aerobics Patient will verbalize understanding of everyday activities that increase physical activity by providing examples like the following: Yard work, such as: Sales promotion account executive Gardening Washing windows or floors Patient will be able to explain general safety guidelines for exercising:  Before you start a new exercise program, talk with your health care provider. Do not exercise so much that you hurt yourself, feel dizzy, or get very short of breath. Wear comfortable clothes and wear shoes with good support. Drink plenty of water while you exercise to prevent dehydration or heat stroke. Work out until your breathing and your heartbeat get faster.        Depression Screen PHQ 2/9 Scores 07/16/2021 07/04/2021 04/02/2021 12/21/2020 09/20/2020 07/14/2020 07/14/2020  PHQ - 2 Score 0 0 0 0 0 0 0  PHQ- 9 Score - - 0 - - - -    Fall Risk Fall Risk  07/16/2021 07/04/2021 04/18/2021 04/02/2021 12/21/2020  Falls in the past year? 1 1 1 1  0  Comment - - - - -  Number falls in past yr: 0 - 0 0 -  Injury with Fall? 1 - 1 1 -  Risk for fall due to : History of fall(s);Orthopedic patient;Impaired balance/gait - - History of fall(s) -  Follow up Education provided;Falls prevention discussed - - Falls prevention discussed -    FALL RISK  PREVENTION PERTAINING TO THE HOME:  Any stairs in or around the home? No  If so, are there any without handrails? No  Home free of loose throw rugs in walkways, pet beds, electrical cords, etc? Yes  Adequate lighting in your home to reduce risk of falls? Yes   ASSISTIVE DEVICES UTILIZED TO PREVENT FALLS:  Life alert? No  Use of a cane, walker or w/c? No  Grab bars in the bathroom? No  Shower chair or bench in shower? No  Elevated toilet seat or a handicapped toilet? No   TIMED UP AND GO:  Was the test performed? No . Telephonic visit  Cognitive Function:    MMSE - Mini Mental State Exam 05/26/2017  Orientation to time 4  Orientation to Place 5  Registration 3  Attention/ Calculation 5  Recall 2  Language- name 2 objects 2  Language- repeat 1  Language- follow 3 step command 3  Language- read & follow direction 1  Write a sentence 1  Copy design 1  Total score 28     6CIT Screen 07/16/2021 07/14/2020 06/28/2019 06/26/2018  What Year? 0 points 0 points 0 points 0 points  What month? 0 points 0 points 0 points 0 points  What time? 0 points 0 points 0 points 0 points  Count back from 20 0 points 0 points 0 points 0 points  Months in reverse 0 points 0 points 0 points 0 points  Repeat phrase 0 points 0 points 0 points 6 points  Total Score 0 0 0 6    Immunizations Immunization History  Administered Date(s) Administered   Fluad Quad(high Dose 65+) 06/01/2019, 05/17/2020, 05/30/2021   Influenza, High Dose Seasonal PF 05/22/2016, 05/21/2017, 05/21/2018  Influenza,inj,Quad PF,6+ Mos 05/10/2013, 05/05/2014, 05/11/2015   Moderna SARS-COV2 Booster Vaccination 05/23/2021   Moderna Sars-Covid-2 Vaccination 09/02/2018, 09/25/2019, 05/31/2020   Pneumococcal Conjugate-13 07/23/2013   Pneumococcal Polysaccharide-23 11/20/2016   Tdap 11/23/2013   Zoster Recombinat (Shingrix) 06/26/2018, 09/04/2018    TDAP status: Up to date  Flu Vaccine status: Up to date  Pneumococcal  vaccine status: Up to date  Covid-19 vaccine status: Completed vaccines  Qualifies for Shingles Vaccine? Yes   Zostavax completed Yes   Shingrix Completed?: Yes  Screening Tests Health Maintenance  Topic Date Due   COVID-19 Vaccine (4 - Booster for Moderna series) 07/18/2021   OPHTHALMOLOGY EXAM  10/03/2021 (Originally 12/21/2020)   FOOT EXAM  12/21/2021   HEMOGLOBIN A1C  01/01/2022   TETANUS/TDAP  11/24/2023   Pneumonia Vaccine 3+ Years old  Completed   INFLUENZA VACCINE  Completed   Zoster Vaccines- Shingrix  Completed   HPV VACCINES  Aged Out    Health Maintenance  Health Maintenance Due  Topic Date Due   COVID-19 Vaccine (4 - Booster for Moderna series) 07/18/2021    Colorectal cancer screening: No longer required.   Lung Cancer Screening: (Low Dose CT Chest recommended if Age 52-80 years, 30 pack-year currently smoking OR have quit w/in 15years.) does not qualify.  Additional Screening:  Hepatitis C Screening: does not qualify  Vision Screening: Recommended annual ophthalmology exams for early detection of glaucoma and other disorders of the eye. Is the patient up to date with their annual eye exam?  Yes  Who is the provider or what is the name of the office in which the patient attends annual eye exams? [facility] If pt is not established with a provider, would they like to be referred to a provider to establish care? No .   Dental Screening: Recommended annual dental exams for proper oral hygiene  Community Resource Referral / Chronic Care Management: CRR required this visit?  No   CCM required this visit?  No      Plan:     I have personally reviewed and noted the following in the patient's chart:   Medical and social history Use of alcohol, tobacco or illicit drugs  Current medications and supplements including opioid prescriptions. Patient is not currently taking opioid prescriptions. Functional ability and status Nutritional status Physical  activity Advanced directives List of other physicians Hospitalizations, surgeries, and ER visits in previous 12 months Vitals Screenings to include cognitive, depression, and falls Referrals and appointments  In addition, I have reviewed and discussed with patient certain preventive protocols, quality metrics, and best practice recommendations. A written personalized care plan for preventive services as well as general preventive health recommendations were provided to patient.     Sandrea Hammond, LPN   13/24/4010   Nurse Notes: None

## 2021-08-01 ENCOUNTER — Telehealth: Payer: Self-pay | Admitting: Family Medicine

## 2021-08-01 ENCOUNTER — Other Ambulatory Visit: Payer: Self-pay

## 2021-08-01 ENCOUNTER — Telehealth: Payer: Self-pay | Admitting: Neurology

## 2021-08-01 DIAGNOSIS — G20 Parkinson's disease: Secondary | ICD-10-CM

## 2021-08-01 MED ORDER — CARBIDOPA-LEVODOPA 25-100 MG PO TABS: 1.5000 | ORAL_TABLET | Freq: Three times a day (TID) | ORAL | 0 refills | Status: DC

## 2021-08-01 NOTE — Telephone Encounter (Signed)
Pts wife called to let us know that they have tried getting a refill on his Carbidopa-levodopa Rx but insurance wont pay for it until 08/08/21 and pt is out of his medicine.  Said pt was taking 1 tablet 3x daily but now he is taking 1.5 tablets 3x daily and she thinks that is what is causing this issue.  Please advise and call wife.

## 2021-08-01 NOTE — Telephone Encounter (Signed)
Called and verified dose and frequency with patients wife and prescription has bene sent to the [facility] in [place]

## 2021-08-01 NOTE — Telephone Encounter (Signed)
Patient aware and verbalized understanding. °

## 2021-08-01 NOTE — Telephone Encounter (Signed)
Thank you for forwarding to office. I spoke with patient at 12:30 she had left the office a message she needed  carbidopa levodopa medication for patient and he would be out of that medication. I sent medication in and told patients wife if she needed anything else that she could always call our office and we would take care of prescriptions that Dr. Carles Collet prescribes for him.

## 2021-08-01 NOTE — Telephone Encounter (Signed)
Spoke with wife she is a little upset states she dose not think she can get in touch with D. Tat and patient will be out Friday will forward to their office to let them know about patient.

## 2021-08-01 NOTE — Telephone Encounter (Signed)
Please ask the patient to contact Dr. Carles Collet since she is the prescriber for that medication. We can do refils, but since this may involve a change in medication, it should come from her.

## 2021-09-16 ENCOUNTER — Other Ambulatory Visit: Payer: Self-pay | Admitting: Neurology

## 2021-09-16 DIAGNOSIS — G20 Parkinson's disease: Secondary | ICD-10-CM

## 2021-09-17 ENCOUNTER — Other Ambulatory Visit: Payer: Self-pay

## 2021-10-04 ENCOUNTER — Ambulatory Visit (INDEPENDENT_AMBULATORY_CARE_PROVIDER_SITE_OTHER): Payer: Medicare Other | Admitting: Family Medicine

## 2021-10-04 ENCOUNTER — Encounter: Payer: Self-pay | Admitting: Family Medicine

## 2021-10-04 VITALS — BP 133/71 | HR 76 | Ht 67.0 in | Wt 192.0 lb

## 2021-10-04 DIAGNOSIS — E785 Hyperlipidemia, unspecified: Secondary | ICD-10-CM | POA: Diagnosis not present

## 2021-10-04 DIAGNOSIS — E1159 Type 2 diabetes mellitus with other circulatory complications: Secondary | ICD-10-CM

## 2021-10-04 DIAGNOSIS — E1169 Type 2 diabetes mellitus with other specified complication: Secondary | ICD-10-CM | POA: Diagnosis not present

## 2021-10-04 DIAGNOSIS — I152 Hypertension secondary to endocrine disorders: Secondary | ICD-10-CM

## 2021-10-04 DIAGNOSIS — K219 Gastro-esophageal reflux disease without esophagitis: Secondary | ICD-10-CM | POA: Diagnosis not present

## 2021-10-04 LAB — CBC WITH DIFFERENTIAL/PLATELET
Basophils Absolute: 0.1 10*3/uL (ref 0.0–0.2)
Basos: 0 %
EOS (ABSOLUTE): 0.1 10*3/uL (ref 0.0–0.4)
Eos: 1 %
Hematocrit: 43.1 % (ref 37.5–51.0)
Hemoglobin: 14.3 g/dL (ref 13.0–17.7)
Immature Grans (Abs): 0 10*3/uL (ref 0.0–0.1)
Immature Granulocytes: 0 %
Lymphocytes Absolute: 3 10*3/uL (ref 0.7–3.1)
Lymphs: 18 %
MCH: 32.2 pg (ref 26.6–33.0)
MCHC: 33.2 g/dL (ref 31.5–35.7)
MCV: 97 fL (ref 79–97)
Monocytes Absolute: 1.4 10*3/uL — ABNORMAL HIGH (ref 0.1–0.9)
Monocytes: 8 %
Neutrophils Absolute: 12 10*3/uL — ABNORMAL HIGH (ref 1.4–7.0)
Neutrophils: 73 %
Platelets: 343 10*3/uL (ref 150–450)
RBC: 4.44 x10E6/uL (ref 4.14–5.80)
RDW: 13.1 % (ref 11.6–15.4)
WBC: 16.6 10*3/uL — ABNORMAL HIGH (ref 3.4–10.8)

## 2021-10-04 LAB — CMP14+EGFR
ALT: 14 IU/L (ref 0–44)
AST: 15 IU/L (ref 0–40)
Albumin/Globulin Ratio: 1.7 (ref 1.2–2.2)
Albumin: 4.5 g/dL (ref 3.7–4.7)
Alkaline Phosphatase: 108 IU/L (ref 44–121)
BUN/Creatinine Ratio: 15 (ref 10–24)
BUN: 14 mg/dL (ref 8–27)
Bilirubin Total: 1 mg/dL (ref 0.0–1.2)
CO2: 25 mmol/L (ref 20–29)
Calcium: 9.7 mg/dL (ref 8.6–10.2)
Chloride: 102 mmol/L (ref 96–106)
Creatinine, Ser: 0.93 mg/dL (ref 0.76–1.27)
Globulin, Total: 2.6 g/dL (ref 1.5–4.5)
Glucose: 115 mg/dL — ABNORMAL HIGH (ref 70–99)
Potassium: 4.4 mmol/L (ref 3.5–5.2)
Sodium: 141 mmol/L (ref 134–144)
Total Protein: 7.1 g/dL (ref 6.0–8.5)
eGFR: 83 mL/min/{1.73_m2} (ref >59)

## 2021-10-04 LAB — BAYER DCA HB A1C WAIVED: HB A1C (BAYER DCA - WAIVED): 6.3 % — ABNORMAL HIGH (ref 4.8–5.6)

## 2021-10-04 LAB — LIPID PANEL
Chol/HDL Ratio: 2.5 ratio (ref 0.0–5.0)
Cholesterol, Total: 153 mg/dL (ref 100–199)
HDL: 61 mg/dL (ref >39)
LDL Chol Calc (NIH): 71 mg/dL (ref 0–99)
Triglycerides: 116 mg/dL (ref 0–149)
VLDL Cholesterol Cal: 21 mg/dL (ref 5–40)

## 2021-10-04 MED ORDER — PANTOPRAZOLE SODIUM 40 MG PO TBEC: 40.0000 mg | DELAYED_RELEASE_TABLET | ORAL | 3 refills | Status: DC

## 2021-10-04 MED ORDER — ATORVASTATIN CALCIUM 40 MG PO TABS: 40.0000 mg | ORAL_TABLET | Freq: Every day | ORAL | 3 refills | Status: DC

## 2021-10-04 NOTE — Progress Notes (Signed)
? ?BP 133/71   Pulse 76   Ht _0  (1.702 m)   Wt 192 lb (87.1 kg)   SpO2 90%   BMI 30.07 kg/m?   ?Oxygen 96% and 95% on recheck. ? ?Subjective:  ? ?Patient ID: Paul [name], male    DOB: 1940/08/15, 81 y.o.   MRN: [id number] ? ?HPI: ?Paul [name] is a 81 y.o. male presenting on 10/04/2021 for Medical Management of Chronic Issues and Diabetes ? ? ?HPI ?Type 2 diabetes mellitus ?Patient comes in today for recheck of his diabetes. Patient has been currently taking no medication, A1c looks good at 6.3.. Patient is currently on an ACE inhibitor/ARB. Patient has not seen an ophthalmologist this year. Patient denies any issues with their feet. The symptom started onset as an adult hypertension and hyperlipidemia ARE RELATED TO DM  ? ?Hypertension ?Patient is currently on amlodipine and irbesartan, and their blood pressure today is 133/71. Patient denies any lightheadedness or dizziness. Patient denies headaches, blurred vision, chest pains, shortness of breath, or weakness. Denies any side effects from medication and is content with current medication.  ? ?Hyperlipidemia ?Patient is coming in for recheck of his hyperlipidemia. The patient is currently taking atorvastatin. They deny any issues with myalgias or history of liver damage from it. They deny any focal numbness or weakness or chest pain.  ? ?GERD ?Patient is currently on pantoprazole.  She denies any major symptoms or abdominal pain or belching or burping. She denies any blood in her stool or lightheadedness or dizziness.  ? ?Relevant past medical, surgical, family and social history reviewed and updated as indicated. Interim medical history since our last visit reviewed. ?Allergies and medications reviewed and updated. ? ?Review of Systems  ?Constitutional:  Negative for chills and fever.  ?Eyes:  Negative for visual disturbance.  ?Respiratory:  Negative for shortness of breath and wheezing.   ?Cardiovascular:  Negative for chest pain and leg swelling.   ?Musculoskeletal:  Negative for back pain and gait problem.  ?Skin:  Negative for rash.  ?Neurological:  Positive for tremors.  ?All other systems reviewed and are negative. ? ?Per HPI unless specifically indicated above ? ? ?Allergies as of 10/04/2021   ? ?   Reactions  ? Sulfonamide Derivatives   ? REACTION: rash  ? ?  ? ?  ?Medication List  ?  ? ?  ? Accurate as of October 04, 2021  9:13 AM. If you have any questions, ask your nurse or doctor.  ?  ?  ? ?  ? ?amLODipine 5 MG tablet ?Commonly known as: NORVASC ?Take 1 tablet (5 mg total) by mouth daily. ?  ?aspirin 81 MG tablet ?Take 81 mg by mouth daily. ?  ?atorvastatin 40 MG tablet ?Commonly known as: LIPITOR ?Take 1 tablet (40 mg total) by mouth daily. ?  ?carbidopa-levodopa 25-100 MG tablet ?Commonly known as: SINEMET IR ?TAKE 1 & 1/2 (ONE & ONE-HALF) TABLETS BY MOUTH THREE TIMES DAILY ?  ?irbesartan 75 MG tablet ?Commonly known as: AVAPRO ?Take 1 tablet (75 mg total) by mouth daily. ?  ?pantoprazole 40 MG tablet ?Commonly known as: PROTONIX ?Take 1 tablet (40 mg total) by mouth every other day. ?  ?Vitamin D3 50 MCG (2000 UT) Tabs ?Take 2,000 Units by mouth daily. ?  ? ?  ? ? ? ?Objective:  ? ?BP 133/71   Pulse 76   Ht _1  (1.702 m)   Wt 192 lb (87.1 kg)   SpO2 90%  BMI 30.07 kg/m?   ?Wt Readings from Last 3 Encounters:  ?10/04/21 192 lb (87.1 kg)  ?07/16/21 193 lb (87.5 kg)  ?07/04/21 193 lb (87.5 kg)  ?  ?Physical Exam ?Vitals and nursing note reviewed.  ?Constitutional:   ?   General: He is not in acute distress. ?   Appearance: He is well-developed. He is not diaphoretic.  ?Eyes:  ?   General: No scleral icterus. ?   Conjunctiva/sclera: Conjunctivae normal.  ?Neck:  ?   Thyroid: No thyromegaly.  ?Cardiovascular:  ?   Rate and Rhythm: Normal rate and regular rhythm.  ?   Heart sounds: Normal heart sounds. No murmur heard. ?Pulmonary:  ?   Effort: Pulmonary effort is normal. No respiratory distress.  ?   Breath sounds: Normal breath sounds. No wheezing.   ?Musculoskeletal:     ?   General: No swelling. Normal range of motion.  ?   Cervical back: Neck supple.  ?Lymphadenopathy:  ?   Cervical: No cervical adenopathy.  ?Skin: ?   General: Skin is warm and dry.  ?   Findings: No rash.  ?Neurological:  ?   Mental Status: He is alert and oriented to person, place, and time.  ?   Coordination: Coordination normal.  ?Psychiatric:     ?   Behavior: Behavior normal.  ? ? ? ? ?Assessment & Plan:  ? ?Problem List Items Addressed This Visit   ? ?  ? Cardiovascular and Mediastinum  ? Hypertension associated with diabetes ([place]) (Chronic)  ? Relevant Medications  ? atorvastatin (LIPITOR) 40 MG tablet  ? Other Relevant Orders  ? CBC with Differential/Platelet  ? CMP14+EGFR  ? Lipid panel  ? Bayer DCA Hb A1c Waived  ?  ? Digestive  ? GERD (gastroesophageal reflux disease)  ? Relevant Medications  ? pantoprazole (PROTONIX) 40 MG tablet  ?  ? Endocrine  ? Hyperlipidemia associated with type 2 diabetes mellitus ([place])  ? Relevant Medications  ? atorvastatin (LIPITOR) 40 MG tablet  ? Other Relevant Orders  ? CBC with Differential/Platelet  ? CMP14+EGFR  ? Lipid panel  ? Bayer DCA Hb A1c Waived  ? Type 2 diabetes mellitus with other specified complication ([place]) - Primary  ? Relevant Medications  ? atorvastatin (LIPITOR) 40 MG tablet  ? Other Relevant Orders  ? CBC with Differential/Platelet  ? CMP14+EGFR  ? Lipid panel  ? Bayer DCA Hb A1c Waived  ?  ?A1c is 6.3 and looks good, continue current medications ?Follow up plan: ?Return in about 3 months (around 01/04/2022), or if symptoms worsen or fail to improve, for dm and htn. ? ?Counseling provided for all of the vaccine components ?Orders Placed This Encounter  ?Procedures  ? CBC with Differential/Platelet  ? CMP14+EGFR  ? Lipid panel  ? Bayer DCA Hb A1c Waived  ? ? ?Caryl Pina, MD ?[place] ?10/04/2021, 9:13 AM ? ? ? ? ?

## 2021-10-05 ENCOUNTER — Ambulatory Visit (INDEPENDENT_AMBULATORY_CARE_PROVIDER_SITE_OTHER): Payer: Medicare Other

## 2021-10-05 ENCOUNTER — Ambulatory Visit (INDEPENDENT_AMBULATORY_CARE_PROVIDER_SITE_OTHER): Payer: Medicare Other | Admitting: Family Medicine

## 2021-10-05 ENCOUNTER — Encounter: Payer: Self-pay | Admitting: Family Medicine

## 2021-10-05 ENCOUNTER — Other Ambulatory Visit: Payer: Self-pay

## 2021-10-05 ENCOUNTER — Inpatient Hospital Stay (HOSPITAL_COMMUNITY)
Admission: EM | Admit: 2021-10-05 | Discharge: 2021-10-11 | DRG: 871 | Disposition: A | Discharge status: Home Health | Payer: Medicare Other | Source: Ambulatory Visit | Attending: Family Medicine | Admitting: Family Medicine

## 2021-10-05 VITALS — BP 86/57 | HR 90 | Ht 67.0 in | Wt 194.0 lb

## 2021-10-05 DIAGNOSIS — A419 Sepsis, unspecified organism: Secondary | ICD-10-CM | POA: Diagnosis not present

## 2021-10-05 DIAGNOSIS — Z79899 Other long term (current) drug therapy: Secondary | ICD-10-CM | POA: Diagnosis not present

## 2021-10-05 DIAGNOSIS — A409 Streptococcal sepsis, unspecified: Secondary | ICD-10-CM | POA: Diagnosis not present

## 2021-10-05 DIAGNOSIS — I152 Hypertension secondary to endocrine disorders: Secondary | ICD-10-CM | POA: Diagnosis present

## 2021-10-05 DIAGNOSIS — I10 Essential (primary) hypertension: Secondary | ICD-10-CM | POA: Diagnosis present

## 2021-10-05 DIAGNOSIS — R0602 Shortness of breath: Secondary | ICD-10-CM

## 2021-10-05 DIAGNOSIS — E1169 Type 2 diabetes mellitus with other specified complication: Secondary | ICD-10-CM | POA: Diagnosis present

## 2021-10-05 DIAGNOSIS — E782 Mixed hyperlipidemia: Secondary | ICD-10-CM

## 2021-10-05 DIAGNOSIS — Z8249 Family history of ischemic heart disease and other diseases of the circulatory system: Secondary | ICD-10-CM | POA: Diagnosis not present

## 2021-10-05 DIAGNOSIS — R6520 Severe sepsis without septic shock: Secondary | ICD-10-CM | POA: Diagnosis not present

## 2021-10-05 DIAGNOSIS — K5900 Constipation, unspecified: Secondary | ICD-10-CM | POA: Diagnosis present

## 2021-10-05 DIAGNOSIS — N179 Acute kidney failure, unspecified: Secondary | ICD-10-CM | POA: Diagnosis present

## 2021-10-05 DIAGNOSIS — E872 Acidosis, unspecified: Secondary | ICD-10-CM | POA: Diagnosis present

## 2021-10-05 DIAGNOSIS — Z806 Family history of leukemia: Secondary | ICD-10-CM | POA: Diagnosis not present

## 2021-10-05 DIAGNOSIS — R0600 Dyspnea, unspecified: Secondary | ICD-10-CM

## 2021-10-05 DIAGNOSIS — Z833 Family history of diabetes mellitus: Secondary | ICD-10-CM | POA: Diagnosis not present

## 2021-10-05 DIAGNOSIS — I517 Cardiomegaly: Secondary | ICD-10-CM | POA: Diagnosis not present

## 2021-10-05 DIAGNOSIS — J154 Pneumonia due to other streptococci: Secondary | ICD-10-CM | POA: Diagnosis present

## 2021-10-05 DIAGNOSIS — R079 Chest pain, unspecified: Secondary | ICD-10-CM

## 2021-10-05 DIAGNOSIS — Z9079 Acquired absence of other genital organ(s): Secondary | ICD-10-CM | POA: Diagnosis not present

## 2021-10-05 DIAGNOSIS — Z20822 Contact with and (suspected) exposure to covid-19: Secondary | ICD-10-CM | POA: Diagnosis present

## 2021-10-05 DIAGNOSIS — Z8546 Personal history of malignant neoplasm of prostate: Secondary | ICD-10-CM | POA: Diagnosis not present

## 2021-10-05 DIAGNOSIS — E785 Hyperlipidemia, unspecified: Secondary | ICD-10-CM | POA: Diagnosis not present

## 2021-10-05 DIAGNOSIS — E876 Hypokalemia: Secondary | ICD-10-CM | POA: Diagnosis not present

## 2021-10-05 DIAGNOSIS — J9601 Acute respiratory failure with hypoxia: Secondary | ICD-10-CM | POA: Diagnosis present

## 2021-10-05 DIAGNOSIS — J189 Pneumonia, unspecified organism: Secondary | ICD-10-CM

## 2021-10-05 DIAGNOSIS — Z7982 Long term (current) use of aspirin: Secondary | ICD-10-CM | POA: Diagnosis not present

## 2021-10-05 DIAGNOSIS — J90 Pleural effusion, not elsewhere classified: Secondary | ICD-10-CM | POA: Diagnosis not present

## 2021-10-05 DIAGNOSIS — K219 Gastro-esophageal reflux disease without esophagitis: Secondary | ICD-10-CM | POA: Diagnosis present

## 2021-10-05 DIAGNOSIS — Z87891 Personal history of nicotine dependence: Secondary | ICD-10-CM | POA: Diagnosis not present

## 2021-10-05 DIAGNOSIS — R7881 Bacteremia: Secondary | ICD-10-CM | POA: Diagnosis not present

## 2021-10-05 DIAGNOSIS — E1159 Type 2 diabetes mellitus with other circulatory complications: Secondary | ICD-10-CM | POA: Diagnosis present

## 2021-10-05 LAB — URINALYSIS, ROUTINE W REFLEX MICROSCOPIC
Bilirubin Urine: NEGATIVE
Glucose, UA: NEGATIVE mg/dL
Hgb urine dipstick: NEGATIVE
Ketones, ur: 5 mg/dL — AB
Leukocytes,Ua: NEGATIVE
Nitrite: NEGATIVE
Protein, ur: NEGATIVE mg/dL
Specific Gravity, Urine: 1.02 (ref 1.005–1.030)
pH: 5 (ref 5.0–8.0)

## 2021-10-05 LAB — COMPREHENSIVE METABOLIC PANEL
ALT: 9 U/L (ref 0–44)
AST: 32 U/L (ref 15–41)
Albumin: 3.5 g/dL (ref 3.5–5.0)
Alkaline Phosphatase: 59 U/L (ref 38–126)
Anion gap: 14 (ref 5–15)
BUN: 33 mg/dL — ABNORMAL HIGH (ref 8–23)
CO2: 21 mmol/L — ABNORMAL LOW (ref 22–32)
Calcium: 8.6 mg/dL — ABNORMAL LOW (ref 8.9–10.3)
Chloride: 97 mmol/L — ABNORMAL LOW (ref 98–111)
Creatinine, Ser: 1.53 mg/dL — ABNORMAL HIGH (ref 0.61–1.24)
GFR, Estimated: 46 mL/min — ABNORMAL LOW (ref >60)
Glucose, Bld: 158 mg/dL — ABNORMAL HIGH (ref 70–99)
Potassium: 3.5 mmol/L (ref 3.5–5.1)
Sodium: 132 mmol/L — ABNORMAL LOW (ref 135–145)
Total Bilirubin: 2.7 mg/dL — ABNORMAL HIGH (ref 0.3–1.2)
Total Protein: 6.7 g/dL (ref 6.5–8.1)

## 2021-10-05 LAB — CBG MONITORING, ED: Glucose-Capillary: 136 mg/dL — ABNORMAL HIGH (ref 70–99)

## 2021-10-05 LAB — CBC WITH DIFFERENTIAL/PLATELET
Band Neutrophils: 19 %
Basophils Absolute: 0 10*3/uL (ref 0.0–0.1)
Basophils Relative: 0 %
Eosinophils Absolute: 0 10*3/uL (ref 0.0–0.5)
Eosinophils Relative: 0 %
HCT: 43.4 % (ref 39.0–52.0)
Hemoglobin: 14.1 g/dL (ref 13.0–17.0)
Lymphocytes Relative: 2 %
Lymphs Abs: 0.3 10*3/uL — ABNORMAL LOW (ref 0.7–4.0)
MCH: 32.6 pg (ref 26.0–34.0)
MCHC: 32.5 g/dL (ref 30.0–36.0)
MCV: 100.5 fL — ABNORMAL HIGH (ref 80.0–100.0)
Monocytes Absolute: 1.2 10*3/uL — ABNORMAL HIGH (ref 0.1–1.0)
Monocytes Relative: 9 %
Neutro Abs: 11.9 10*3/uL — ABNORMAL HIGH (ref 1.7–7.7)
Neutrophils Relative %: 70 %
Platelets: 269 10*3/uL (ref 150–400)
RBC: 4.32 MIL/uL (ref 4.22–5.81)
RDW: 13.9 % (ref 11.5–15.5)
WBC: 13.4 10*3/uL — ABNORMAL HIGH (ref 4.0–10.5)
nRBC: 0 % (ref 0.0–0.2)

## 2021-10-05 LAB — TROPONIN I (HIGH SENSITIVITY)
Troponin I (High Sensitivity): 14 ng/L (ref <18)
Troponin I (High Sensitivity): 14 ng/L (ref <18)

## 2021-10-05 LAB — LACTIC ACID, PLASMA
Lactic Acid, Venous: 4.9 mmol/L (ref 0.5–1.9)
Lactic Acid, Venous: 4.5 mmol/L (ref 0.5–1.9)

## 2021-10-05 LAB — RESP PANEL BY RT-PCR (FLU A&B, COVID) ARPGX2
Influenza A by PCR: NEGATIVE
Influenza B by PCR: NEGATIVE
SARS Coronavirus 2 by RT PCR: NEGATIVE

## 2021-10-05 LAB — PROTIME-INR
INR: 1.3 — ABNORMAL HIGH (ref 0.8–1.2)
Prothrombin Time: 15.9 seconds — ABNORMAL HIGH (ref 11.4–15.2)

## 2021-10-05 LAB — APTT: aPTT: 31 seconds (ref 24–36)

## 2021-10-05 MED ORDER — SODIUM CHLORIDE 0.9 % IV SOLN
500.0000 mg | INTRAVENOUS | Status: DC
  Administered 2021-10-05 – 2021-10-06 (×2): 500 mg via INTRAVENOUS
  Filled 2021-10-05 (×2): qty 5

## 2021-10-05 MED ORDER — SODIUM CHLORIDE 0.9 % IV SOLN
2.0000 g | INTRAVENOUS | Status: DC
  Administered 2021-10-05: 2 g via INTRAVENOUS
  Filled 2021-10-05: qty 20

## 2021-10-05 MED ORDER — LACTATED RINGERS IV BOLUS
1000.0000 mL | Freq: Once | INTRAVENOUS | Status: AC
  Administered 2021-10-05: 1000 mL via INTRAVENOUS

## 2021-10-05 MED ORDER — LACTATED RINGERS IV BOLUS (SEPSIS)
1000.0000 mL | Freq: Once | INTRAVENOUS | Status: AC
  Administered 2021-10-05: 1000 mL via INTRAVENOUS

## 2021-10-05 MED ORDER — INSULIN ASPART 100 UNIT/ML IJ SOLN
0.0000 [IU] | Freq: Three times a day (TID) | INTRAMUSCULAR | Status: DC
  Administered 2021-10-06: 3 [IU] via SUBCUTANEOUS
  Administered 2021-10-06: 13:00:00 2 [IU] via SUBCUTANEOUS
  Administered 2021-10-07 (×2): 3 [IU] via SUBCUTANEOUS
  Administered 2021-10-07 – 2021-10-09 (×4): 2 [IU] via SUBCUTANEOUS
  Administered 2021-10-09: 3 [IU] via SUBCUTANEOUS
  Administered 2021-10-10: 2 [IU] via SUBCUTANEOUS
  Administered 2021-10-11: 12:00:00 3 [IU] via SUBCUTANEOUS

## 2021-10-05 MED ORDER — LACTATED RINGERS IV SOLN: INTRAVENOUS | Status: AC

## 2021-10-05 MED ORDER — INSULIN ASPART 100 UNIT/ML IJ SOLN: 0.0000 [IU] | Freq: Every day | INTRAMUSCULAR | Status: DC

## 2021-10-05 NOTE — Assessment & Plan Note (Addendum)
-  Cr increase from 0.93 at baseline up to 1.53 at time of admission. ?-Continue to avoid nephrotoxic agents ?-Avoid hypotension and the use of contrast ?-Will continue to maintain adequate hydration and follow renal function trend/stability ?-Creatinine 0.70 currently. ? ? ?

## 2021-10-05 NOTE — ED Notes (Signed)
RT called to initiate BiPap per Dr Neomia Dear request ?

## 2021-10-05 NOTE — Assessment & Plan Note (Addendum)
Continue protonix  

## 2021-10-05 NOTE — ED Triage Notes (Signed)
Per PCP diagnosed with left sided pneumonia, hypotensive and tachypneic in office. Co left sided chest pain. Arrived pov from office. Sats on room air 83% placed on 2lnc. ?

## 2021-10-05 NOTE — Progress Notes (Signed)
Patient sats in lower 90s to 89%, raised FIO2 up to 60%, sat now at 93%. ?

## 2021-10-05 NOTE — ED Provider Notes (Addendum)
?[facility] ?Provider Note ? ? ?CSN: [id number] ?Arrival date & time: 10/05/21  1247 ? ?  ? ?History ? ?Chief Complaint  ?Patient presents with  ? Fever  ? Cough  ? Shortness of Breath  ? ? ?Paul [name] is a 81 y.o. male. ? ? ?Fever ?Associated symptoms: cough   ?Cough ?Associated symptoms: fever and shortness of breath   ?Shortness of Breath ?Associated symptoms: cough and fever   ?Sent in from PCP with hypoxia and hypotension.  On oxygen.  Pneumonia on her x-ray.  However blood pressure improved here.  Still requiring oxygen however. ?  ?Past Medical History:  ?Diagnosis Date  ? 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 Gleason 7; s/p prostatectomy; involving both lobes; No extraprostatic involvement; neg margin;  pT2c, pN0, M0   ? Diabetes mellitus without complication ([place])   ? Full dentures   ? does not wear bottoms  ? GERD (gastroesophageal reflux disease)   ? Hyperlipidemia   ? Hypertension   ? Iron deficiency anemia   ? presumped malabsorption; neg EGD/ capsule endos/colosopcy  ? Iron deficiency anemia, unspecified 10/16/2012  ? Kidney stones   ? Parkinson's disease ([place])   ?  ?Home Medications ?Prior to Admission medications   ?Medication Sig Start Date End Date Taking? Authorizing Provider  ?amLODipine (NORVASC) 5 MG tablet Take 1 tablet (5 mg total) by mouth daily. 07/04/21   Dettinger, Fransisca Kaufmann, MD  ?aspirin 81 MG tablet Take 81 mg by mouth daily.    [provider]  ?atorvastatin (LIPITOR) 40 MG tablet Take 1 tablet (40 mg total) by mouth daily. 10/04/21   Dettinger, Fransisca Kaufmann, MD  ?carbidopa-levodopa (SINEMET IR) 25-100 MG tablet TAKE 1 & 1/2 (ONE & ONE-HALF) TABLETS BY MOUTH THREE TIMES DAILY 09/17/21   Tat, Eustace Quail, DO  ?Cholecalciferol (VITAMIN D3) 2000 UNITS TABS Take 2,000 Units by mouth daily.    [provider]  ?irbesartan (AVAPRO) 75 MG tablet Take 1 tablet (75 mg total) by mouth daily. 07/04/21   Dettinger, Fransisca Kaufmann, MD  ?pantoprazole (PROTONIX) 40 MG  tablet Take 1 tablet (40 mg total) by mouth every other day. 10/04/21   Dettinger, Fransisca Kaufmann, MD  ?   ? ?Allergies    ?Sulfonamide derivatives   ? ?Review of Systems   ?Review of Systems  ?Constitutional:  Positive for fever.  ?Respiratory:  Positive for cough and shortness of breath.   ? ?Physical Exam ?Updated Vital Signs ?BP (!) 92/58   Pulse 78   Temp 97.8 ?F (36.6 ?C) (Oral)   Resp (!) 26   SpO2 94%  ?Physical Exam ?Vitals and nursing note reviewed.  ?Musculoskeletal:  ?   Right lower leg: No tenderness.  ?   Left lower leg: No tenderness.  ?Skin: ?   Capillary Refill: Capillary refill takes less than 2 seconds.  ?Neurological:  ?   Mental Status: He is alert and oriented to person, place, and time.  ? ? ?ED Results / Procedures / Treatments   ?Labs ?(all labs ordered are listed, but only abnormal results are displayed) ?Labs Reviewed  ?COMPREHENSIVE METABOLIC PANEL - Abnormal; Notable for the following components:  ?    Result Value  ? Sodium 132 (*)   ? Chloride 97 (*)   ? CO2 21 (*)   ? Glucose, Bld 158 (*)   ? BUN 33 (*)   ? Creatinine, Ser 1.53 (*)   ? Calcium 8.6 (*)   ? Total  Bilirubin 2.7 (*)   ? GFR, Estimated 46 (*)   ? All other components within normal limits  ?LACTIC ACID, PLASMA - Abnormal; Notable for the following components:  ? Lactic Acid, Venous 4.9 (*)   ? All other components within normal limits  ?CBC WITH DIFFERENTIAL/PLATELET - Abnormal; Notable for the following components:  ? WBC 13.4 (*)   ? MCV 100.5 (*)   ? Neutro Abs 11.9 (*)   ? Lymphs Abs 0.3 (*)   ? Monocytes Absolute 1.2 (*)   ? All other components within normal limits  ?PROTIME-INR - Abnormal; Notable for the following components:  ? Prothrombin Time 15.9 (*)   ? INR 1.3 (*)   ? All other components within normal limits  ?CULTURE, BLOOD (ROUTINE X 2)  ?CULTURE, BLOOD (ROUTINE X 2)  ?RESP PANEL BY RT-PCR (FLU A&B, COVID) ARPGX2  ?APTT  ?LACTIC ACID, PLASMA  ?URINALYSIS, ROUTINE W REFLEX MICROSCOPIC  ?TROPONIN I (HIGH  SENSITIVITY)  ?TROPONIN I (HIGH SENSITIVITY)  ? ? ?EKG ?EKG Interpretation ? ?Date/Time:  Friday October 05 2021 13:38:03 EST ?Ventricular Rate:  86 ?PR Interval:  137 ?QRS Duration: 123 ?QT Interval:  357 ?QTC Calculation: 427 ?R Axis:   49 ?Text Interpretation: Sinus rhythm Right bundle branch block ST elevation, consider inferior injury Baseline wander in lead(s) V1 Confirmed by Davonna Belling [phone]) on 10/05/2021 1:56:50 PM ? ?Radiology ?DG Chest 2 View ? ?Result Date: 10/05/2021 ?CLINICAL DATA:  Shortness of breath, chest pain EXAM: CHEST - 2 VIEW COMPARISON:  09/21/2012 FINDINGS: Normal heart size, mediastinal contours, and pulmonary vascularity. Atherosclerotic calcification aorta. Extensive airspace infiltrate LEFT upper lobe, new since previous exam, consistent with pneumonia. Minimal atelectasis at lung bases. Remaining lungs clear. Trace LEFT pleural effusion. No pneumothorax or acute osseous findings. IMPRESSION: LEFT upper lobe pneumonia with trace LEFT pleural effusion. Aortic Atherosclerosis (ICD10-I70.0). Electronically Signed   By: Lavonia Dana M.D.   On: 10/05/2021 11:54   ? ?Procedures ?Procedures  ? ? ?Medications Ordered in ED ?Medications  ?lactated ringers infusion ( Intravenous New Bag/Given 10/05/21 1442)  ?cefTRIAXone (ROCEPHIN) 2 g in sodium chloride 0.9 % 100 mL IVPB (0 g Intravenous Stopped 10/05/21 1400)  ?azithromycin (ZITHROMAX) 500 mg in sodium chloride 0.9 % 250 mL IVPB (0 mg Intravenous Stopped 10/05/21 1445)  ?lactated ringers bolus 1,000 mL (0 mLs Intravenous Stopped 10/05/21 1456)  ?lactated ringers bolus 1,000 mL (1,000 mLs Intravenous New Bag/Given 10/05/21 1424)  ?  And  ?lactated ringers bolus 1,000 mL (1,000 mLs Intravenous New Bag/Given 10/05/21 1424)  ? ? ?ED Course/ Medical Decision Making/ A&P ?  ?                        ?Medical Decision Making ?Amount and/or Complexity of Data Reviewed ?External Data Reviewed: notes. ?   Details: Recent PCP notes ?Labs: ordered. Decision-making  details documented in ED Course. ?Radiology: independent interpretation performed. Decision-making details documented in ED Course. ?   Details: Left-sided pneumonia. ?ECG/medicine tests: independent interpretation performed. Decision-making details documented in ED Course. ?Discussion of management or test interpretation with external provider(s): Hospitalist ? ?Risk ?Prescription drug management. ?Decision regarding hospitalization. ?Emergency major surgery. ? ?Critical Care ?Total time providing critical care: 30-74 minutes ?Patient presented from PCP.  Hypoxic and hypotensive for them.  Upon arrival here however still hypoxic but blood pressures improved.  Initial blood pressure of 115/68 here.  However on 6 L of oxygen.  BiPAP had been written for.  X-ray done at PCP showed left-sided pneumonia.  Symptoms began yesterday.  Code sepsis called but initial fluid bolus not given since no hypotension here and improvement of blood pressure.  However now at around 210 lactic acid came back elevated.  With elevated lactate of 4.9 and previous hypotension we will now up to fluid bolus from the right 1 L I had given up to 30/kg for a total of 3 L. ? ?Patient's boluses are infusing.  Blood pressure still marginal but still with a map above 65.  Discussed curbside with hospitalist.  Will recheck lactic acid to be sure it is trending down.  After that will be admitted to hospital.  Is on BiPAP with an FiO2 of 55% at this time.  Care will be turned over to Dr. Eulis Foster. ? ? ? ? ? ? ? ? ? ?Final Clinical Impression(s) / ED Diagnoses ?Final diagnoses:  ?Severe sepsis ([place])  ?Community acquired pneumonia of left lung, unspecified part of lung  ? ? ?Rx / DC Orders ?ED Discharge Orders   ? ? None  ? ?  ? ? ?  ?Davonna Belling, MD ?10/05/21 1503 ? ?  ?Davonna Belling, MD ?10/13/21 [phone] ? ?

## 2021-10-05 NOTE — ED Notes (Signed)
Assisted with urinal

## 2021-10-05 NOTE — H&P (Signed)
History and Physical    Patient: Paul [name] TKZ:[id number] DOB: Oct 07, 1940 DOA: 10/05/2021 DOS: the patient was seen and examined on 10/05/2021 PCP: Dettinger, Fransisca Kaufmann, MD  Patient coming from: Home  Chief Complaint:  Chief Complaint  Patient presents with   Fever   Cough   Shortness of Breath    HPI: Paul [name] is a 81 y.o. male with medical history significant of diabetes mellitus type 2, GERD, hyperlipidemia, hypertension, Parkinson's disease, presents ED with a chief complaint of chest pain.  Patient reports he had sudden onset of chest pain yesterday after lunch.  Is associated with cough and shortness of breath.  His cough is productive of clear, yellow, reddish sputum.  He reports subjective fever and chills.  He took Tylenol which relieved his pain and his fever temporarily.  He describes the pain in his chest is a really bad " hurt."  He also had generalized weakness associated.  Patient denies any sick contacts.  Patient has no other complaints at this time.  Patient does not smoke, does not drink alcohol.  He is vaccinated for COVID.  Patient is full code.  Review of Systems: As mentioned in the history of present illness. All other systems reviewed and are negative. 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   Gleason 7; s/p prostatectomy; involving both lobes; No extraprostatic involvement; neg margin;  pT2c, pN0, M0    Diabetes mellitus without complication ([facility])    Full dentures    does not wear bottoms   GERD (gastroesophageal reflux disease)    Hyperlipidemia    Hypertension    Iron deficiency anemia    presumped malabsorption; neg EGD/ capsule endos/colosopcy   Iron deficiency anemia, unspecified 10/16/2012   Kidney stones    Parkinson's disease [facility])    Past Surgical History:  Procedure Laterality Date   COLONOSCOPY     HEMORRHOID SURGERY N/A 07/05/2014   Procedure: HEMORRHOIDECTOMY;  Surgeon: Erroll Luna, MD;   Location: [facility];  Service: General;  Laterality: N/A;   ORIF RADIAL FRACTURE     right-as child   robtic assisted laparoscopic     radical prostatectomy and bilateral pelvic lymphadenectomy   UPPER GI ENDOSCOPY     Social History:  reports that he quit smoking about 40 years ago. His smoking use included cigarettes. He has a 25.00 pack-year smoking history. He has never used smokeless tobacco. He reports that he does not drink alcohol and does not use drugs.  Allergies  Allergen Reactions   Sulfonamide Derivatives     REACTION: rash    Family History  Problem Relation Age of Onset   Heart failure Mother    Diabetes type II Mother    Diabetes Mother    Heart attack Father    Diabetes Brother    Diabetes Sister    Cancer Sister        colon   Diabetes Sister    Diabetes Brother    Cancer Brother        bone and leukemia   Diabetes Brother    Diabetes Brother    Cancer Brother    Healthy Son        over weight     Prior to Admission medications   Medication Sig Start Date End Date Taking? Authorizing Provider  acetaminophen (TYLENOL) 500 MG tablet Take 1,000 mg by mouth every 6 (six) hours as needed.   Yes [provider]  amLODipine (NORVASC) 5 MG tablet Take 1 tablet (5 mg total) by mouth daily. 07/04/21  Yes Dettinger, Fransisca Kaufmann, MD  aspirin 81 MG tablet Take 81 mg by mouth daily.   Yes [provider]  atorvastatin (LIPITOR) 40 MG tablet Take 1 tablet (40 mg total) by mouth daily. 10/04/21  Yes Dettinger, Fransisca Kaufmann, MD  carbidopa-levodopa (SINEMET IR) 25-100 MG tablet TAKE 1 & 1/2 (ONE & ONE-HALF) TABLETS BY MOUTH THREE TIMES DAILY Patient taking differently: Take by mouth See admin instructions. Take 1 and 1/2 tablet 3 times daily 09/17/21  Yes Tat, Eustace Quail, DO  Cholecalciferol (VITAMIN D3) 2000 UNITS TABS Take 2,000 Units by mouth daily.   Yes [provider]  irbesartan (AVAPRO) 75 MG tablet Take 1 tablet (75 mg total) by  mouth daily. 07/04/21  Yes Dettinger, Fransisca Kaufmann, MD  pantoprazole (PROTONIX) 40 MG tablet Take 1 tablet (40 mg total) by mouth every other day. 10/04/21  Yes Dettinger, Fransisca Kaufmann, MD    Physical Exam: Vitals:   10/05/21 1800 10/05/21 1918 10/05/21 1930 10/05/21 2000  BP: (!) 96/59 125/61 113/63 112/64  Pulse: 78 90 88 93  Resp: (!) 30 (!) 32 (!) 33 (!) 28  Temp:      TempSrc:      SpO2: 92% 92% 93% 92%   1.  General: Patient lying supine in bed, on BiPAP, no acute distress   2. Psychiatric: Alert and oriented x 3, mood and behavior normal for situation, pleasant and cooperative with exam   3. Neurologic: Speech and language are normal, face is symmetric, moves all 4 extremities voluntarily, at baseline without acute deficits on limited exam   4. HEENMT:  Head is atraumatic, normocephalic, pupils reactive to light, neck is supple, trachea is midline, mucous membranes are mildly dry   5. Respiratory : Rhonchi bilaterally without wheezing, Rales, normal work of breathing on BiPAP, no cyanosis, oxygen sats maintaining 93%   6. Cardiovascular : Heart rate normal, rhythm is regular, no murmurs, rubs or gallops, peripheral edema present, peripheral pulses palpated   7. Gastrointestinal:  Abdomen is soft, nondistended, nontender to palpation bowel sounds active, no masses or organomegaly palpated   8. Skin:  Skin is warm, dry and intact without rashes, acute lesions, or ulcers on limited exam   9.Musculoskeletal:  No acute deformities or trauma, no asymmetry in tone, peripheral edema present, peripheral pulses palpated, no tenderness to palpation in the extremities   Data Reviewed: In the ED Temp 97.8, heart rate 76-90, respiratory 22-32, blood pressure 86/57-125/61 Trope 14, 14 Leukocytosis at 13.4 Elevated creatinine at 1.53, hyperglycemia at 158 T. bili 2.7 Lactic acid initially 4.9, then 4.0 Negative COVID Blood cultures pending EKG shows a heart rate 86, sinus rhythm, QTc  427 Chest x-ray shows left upper lobe pneumonia with left pleural effusion 3 L bolus Continue LR at 150 MLS per hour   Assessment and Plan: * Sepsis due to pneumonia ([place]) HR 90, R32, BP 86/57 WBC 13.4, lactic acid 4.9, AKI with Cr 1.53 3 L Bolus in ED Continue LR 173ml/hour CXR = Left upper lobe pneumonia Continue zithromax and Rocephin Blood cultures pending Covid negative Sepsis order set utilized Continue to monitor  AKI (acute kidney injury) ([place]) Cr increase from 0.93 yesterday to 1.53 today Pre-renal in setting of hypotension/sepsis 3L bolus in ED Continue fluids Recheck in the AM Avoid nephrotoxic agents when possible  Acute respiratory failure with hypoxia ([place]) Initially 87% on room air with increased  work of breathing and tachypnea  Started on BiPAP, but FiO2 just increased to 60% to get O2 sats to maintain at 93% 2/2 pneumonia Continue BiPAP, wean off as tolerated See treatment above for pneumonia Continue to monitor  Type 2 diabetes mellitus with other specified complication ([facility]) Last hgb A1C yesterday - 6.3 Sliding scale coverage Monitor CBGs   GERD (gastroesophageal reflux disease) Continue protonix   HTN (hypertension) Holding Norvasc and irbesartan in the setting of hypotension and Sepsis   HLD (hyperlipidemia) Continue statin       Advance Care Planning:   Code Status: Not on file full  Consults: None  Family Communication: No family at bedside  Severity of Illness: The appropriate patient status for this patient is INPATIENT. Inpatient status is judged to be reasonable and necessary in order to provide the required intensity of service to ensure the patient's safety. The patient's presenting symptoms, physical exam findings, and initial radiographic and laboratory data in the context of their chronic comorbidities is felt to place them at high risk for further clinical deterioration. Furthermore, it is not anticipated that the patient  will be medically stable for discharge from the hospital within 2 midnights of admission.   * I certify that at the point of admission it is my clinical judgment that the patient will require inpatient hospital care spanning beyond 2 midnights from the point of admission due to high intensity of service, high risk for further deterioration and high frequency of surveillance required.*  Author: Rolla Plate, DO 10/05/2021 9:07 PM  For on call review [email or web address].

## 2021-10-05 NOTE — Assessment & Plan Note (Addendum)
-  Blood pressure stable and fairly well controlled. ?-Continue holding Norvasc and ARB in the setting of acute sepsis with hypotension and acute kidney injury at time of admission.  Will be okay to resume antihypertensive agents at time of discharge. ?-Maintain adequate hydration and follow heart healthy diet. ?-Continue to follow vital signs. ?-Creatinine has overall improved renal function back to normal. ? ?

## 2021-10-05 NOTE — Assessment & Plan Note (Addendum)
-  Continue statins ?-Continue heart healthy diet. ?

## 2021-10-05 NOTE — Assessment & Plan Note (Addendum)
-  Initially 87% on room air with increased work of breathing and tachypnea. ?-Patient ended requiring BiPAP with FiO2 up to 80% ?-Has responded to bronchodilator management, fluid resuscitation and antibiotics ?-Currently off BiPAP for almost 48 hours; has come down to the use of 3-4 L nasal cannula supplementation. ?-Continue supportive care and wean off oxygen as tolerated. ?-Requesting desaturation screening. ?

## 2021-10-05 NOTE — Assessment & Plan Note (Addendum)
-  Sepsis due to streptococcal pneumonia and bacteremia ?-Patient has met criteria for severe sepsis at time of admission: Presenting with tachypnea, elevated lactic acid, elevated WBCs, soft blood pressure having hypoxia and acute kidney injury as part of organ dysfunction. ?-Lactic acid was also elevated. ?-Continue treatment with ceftriaxone (2 g intravenously daily). ?-Continue as needed bronchodilators, mucolytic's and the use of flutter valve. ?-Patient has also been started on Pulmicort. ?-Responding appropriately and currently down to 3-4 L nasal cannula supplementation.  Will transfer to telemetry bed. ?-Continue to wean oxygen supplementation as tolerated and request oxygen desaturation screening (patient open-minded about going home with oxygen supplementation if needed). ?-After 5 days of IV antibiotics as per ID recommendation patient will be able to go home with oral antibiotics and complete a total of 10 days therapy. ? ?

## 2021-10-05 NOTE — Progress Notes (Signed)
? ?BP (!) 86/57   Pulse 90   Ht 5\' 7"  (1.702 m)   Wt 194 lb (88 kg)   SpO2 (!) 85%   BMI 30.38 kg/m?   ? ?Subjective:  ? ?Patient ID: Paul [name], male    DOB: [birth year], 81 y.o.   MRN: [id number] ? ?HPI: ?Paul [name] is a 81 y.o. male presenting on 10/05/2021 for Chest Pain ? ? ?HPI ?Patient is coming in today complaining of sharp chest pain and shortness of breath and difficulty breathing.  The sharp chest pain is on the left midclavicular area on the lower part of the chest.  He says this all started yesterday.  He was an appointment earlier in the day but then they were going around later that afternoon he started feeling like this then he started feeling chills.  He also has a cough and his wife says that his breathing is been more rapidly and the cough is been darker sputum coming up.  She denies him having any fevers but he does say he has a little bit of chills.  He says the pain is sharp in his chest and it is not having him have some difficulty breathing along with it.  He says he does not know what brought it on.  They were running some errands after they left here but did not start at him doing anything specific. ? ?Relevant past medical, surgical, family and social history reviewed and updated as indicated. Interim medical history since our last visit reviewed. ?Allergies and medications reviewed and updated. ? ?Review of Systems  ?Constitutional:  Positive for chills. Negative for fever.  ?HENT:  Positive for congestion. Negative for ear discharge, ear pain, postnasal drip, rhinorrhea, sinus pressure, sneezing, sore throat and voice change.   ?Eyes:  Negative for visual disturbance.  ?Respiratory:  Positive for cough, chest tightness and shortness of breath. Negative for wheezing.   ?Cardiovascular:  Negative for chest pain and leg swelling.  ?Musculoskeletal:  Negative for gait problem.  ?Skin:  Negative for rash.  ?All other systems reviewed and are negative. ? ?Per HPI unless  specifically indicated above ? ? ?Allergies as of 10/05/2021   ? ?   Reactions  ? Sulfonamide Derivatives   ? REACTION: rash  ? ?  ? ?  ?Medication List  ?  ? ?  ? Accurate as of October 05, 2021 12:17 PM. If you have any questions, ask your nurse or doctor.  ?  ?  ? ?  ? ?amLODipine 5 MG tablet ?Commonly known as: NORVASC ?Take 1 tablet (5 mg total) by mouth daily. ?  ?aspirin 81 MG tablet ?Take 81 mg by mouth daily. ?  ?atorvastatin 40 MG tablet ?Commonly known as: LIPITOR ?Take 1 tablet (40 mg total) by mouth daily. ?  ?carbidopa-levodopa 25-100 MG tablet ?Commonly known as: SINEMET IR ?TAKE 1 & 1/2 (ONE & ONE-HALF) TABLETS BY MOUTH THREE TIMES DAILY ?  ?irbesartan 75 MG tablet ?Commonly known as: AVAPRO ?Take 1 tablet (75 mg total) by mouth daily. ?  ?pantoprazole 40 MG tablet ?Commonly known as: PROTONIX ?Take 1 tablet (40 mg total) by mouth every other day. ?  ?Vitamin D3 50 MCG (2000 UT) Tabs ?Take 2,000 Units by mouth daily. ?  ? ?  ? ? ? ?Objective:  ? ?BP (!) 86/57   Pulse 90   Ht 5\' 7"  (1.702 m)   Wt 194 lb (88 kg)   SpO2 (!) 85%   BMI  30.38 kg/m?   ?Wt Readings from Last 3 Encounters:  ?10/05/21 194 lb (88 kg)  ?10/04/21 192 lb (87.1 kg)  ?07/16/21 193 lb (87.5 kg)  ?  ?Physical Exam ?Vitals and nursing note reviewed.  ?Constitutional:   ?   General: He is not in acute distress. ?   Appearance: He is well-developed. He is not diaphoretic.  ?Eyes:  ?   General: No scleral icterus. ?   Conjunctiva/sclera: Conjunctivae normal.  ?Neck:  ?   Thyroid: No thyromegaly.  ?Cardiovascular:  ?   Rate and Rhythm: Normal rate and regular rhythm.  ?   Heart sounds: Normal heart sounds. No murmur heard. ?Pulmonary:  ?   Effort: Pulmonary effort is normal. Tachypnea present. No respiratory distress.  ?   Breath sounds: Rales (Basilar rales on the left side) present. No wheezing.  ?Musculoskeletal:     ?   General: No swelling. Normal range of motion.  ?   Cervical back: Neck supple.  ?Lymphadenopathy:  ?   Cervical: No  cervical adenopathy.  ?Skin: ?   General: Skin is warm and dry.  ?   Findings: No rash.  ?Neurological:  ?   Mental Status: He is alert and oriented to person, place, and time.  ?   Coordination: Coordination normal.  ?Psychiatric:     ?   Behavior: Behavior normal.  ? ? ?EKG: Sinus rhythm, partial right bundle branch block.  Otherwise normal.  Same as previous EKG in 2015. ? ?Chest x-ray: Left upper lobe pneumonia consolidation. ? ?Assessment & Plan:  ? ?Problem List Items Addressed This Visit   ?None ?Visit Diagnoses   ? ? Community acquired pneumonia of left upper lobe of lung    -  Primary  ? Shortness of breath      ? Relevant Orders  ? DG Chest 2 View (Completed)  ? EKG 12-Lead (Completed)  ? Chest pain, unspecified type      ? Relevant Orders  ? DG Chest 2 View (Completed)  ? EKG 12-Lead (Completed)  ? ?  ?  ?Instructed family to take him to the emergency department.  Possibly concerned for sepsis.  He has his son and his wife here and there again to take him directly over there. ? ?Contacted the emergency department and they will watch out for him. ? ?Discussed possibly going to be EMT but they said that they could  ?transport him quickly. ? ?His blood pressure is down and his oxygen his saturation is 85%. ?Follow up plan: ?Return if symptoms worsen or fail to improve. ? ?Counseling provided for all of the vaccine components ?Orders Placed This Encounter  ?Procedures  ? DG Chest 2 View  ? EKG 12-Lead  ? ? ?Caryl Pina, MD ?[place] ?10/05/2021, 12:17 PM ? ? ? ? ?

## 2021-10-05 NOTE — ED Notes (Signed)
Dr. Alvino Chapel notified of need for Code Sepsis. ?

## 2021-10-05 NOTE — Assessment & Plan Note (Addendum)
-  Recent A1c 6.3 ?-Will continue holding oral hypoglycemic agents while inpatient. -Continue sliding scale insulin ?-Follow-up CBGs fluctuation and further adjust hypoglycemic regimen as needed. ?

## 2021-10-05 NOTE — ED Provider Notes (Signed)
3:50 PM-checkout from Dr. Alvino Chapel to evaluate patient after second lactate following IV fluid bolus ? ?6:55 PM-at this time the blood pressure is 120/86.  Patient remains on BiPAP, and is comfortable. ? ?7:32 PM-hospitalist agrees to admit ?  ?Daleen Bo, MD ?10/05/21 1932 ? ?

## 2021-10-05 NOTE — Sepsis Progress Note (Signed)
eLink is following this Code Sepsis. °

## 2021-10-05 NOTE — ED Notes (Signed)
CRITICAL LAB: ? ?LACTIC 4.9 ? ?REPORTED TO: Dr. Alvino Chapel EDP ?

## 2021-10-06 ENCOUNTER — Encounter (HOSPITAL_COMMUNITY): Payer: Self-pay | Admitting: Family Medicine

## 2021-10-06 ENCOUNTER — Inpatient Hospital Stay (HOSPITAL_COMMUNITY): Payer: Medicare Other

## 2021-10-06 DIAGNOSIS — I10 Essential (primary) hypertension: Secondary | ICD-10-CM

## 2021-10-06 DIAGNOSIS — R6520 Severe sepsis without septic shock: Secondary | ICD-10-CM

## 2021-10-06 LAB — COMPREHENSIVE METABOLIC PANEL
ALT: 13 U/L (ref 0–44)
AST: 24 U/L (ref 15–41)
Albumin: 3.1 g/dL — ABNORMAL LOW (ref 3.5–5.0)
Alkaline Phosphatase: 61 U/L (ref 38–126)
Anion gap: 11 (ref 5–15)
BUN: 25 mg/dL — ABNORMAL HIGH (ref 8–23)
CO2: 24 mmol/L (ref 22–32)
Calcium: 8.7 mg/dL — ABNORMAL LOW (ref 8.9–10.3)
Chloride: 100 mmol/L (ref 98–111)
Creatinine, Ser: 0.84 mg/dL (ref 0.61–1.24)
GFR, Estimated: >60 mL/min (ref >60)
Glucose, Bld: 128 mg/dL — ABNORMAL HIGH (ref 70–99)
Potassium: 3.8 mmol/L (ref 3.5–5.1)
Sodium: 135 mmol/L (ref 135–145)
Total Bilirubin: 0.9 mg/dL (ref 0.3–1.2)
Total Protein: 6.5 g/dL (ref 6.5–8.1)

## 2021-10-06 LAB — BLOOD CULTURE ID PANEL (REFLEXED) - BCID2

## 2021-10-06 LAB — BLOOD GAS, ARTERIAL
Acid-Base Excess: 1.5 mmol/L (ref 0.0–2.0)
Bicarbonate: 25.9 mmol/L (ref 20.0–28.0)
Drawn by: 22179
FIO2: 80 %
O2 Saturation: 97.4 %
Patient temperature: 36.6
pCO2 arterial: 38 mmHg (ref 32–48)
pH, Arterial: 7.44 (ref 7.35–7.45)
pO2, Arterial: 72 mmHg — ABNORMAL LOW (ref 83–108)

## 2021-10-06 LAB — CBC
HCT: 43.6 % (ref 39.0–52.0)
Hemoglobin: 14 g/dL (ref 13.0–17.0)
MCH: 32.8 pg (ref 26.0–34.0)
MCHC: 32.1 g/dL (ref 30.0–36.0)
MCV: 102.1 fL — ABNORMAL HIGH (ref 80.0–100.0)
Platelets: 206 10*3/uL (ref 150–400)
RBC: 4.27 MIL/uL (ref 4.22–5.81)
RDW: 14 % (ref 11.5–15.5)
WBC: 14.3 10*3/uL — ABNORMAL HIGH (ref 4.0–10.5)
nRBC: 0 % (ref 0.0–0.2)

## 2021-10-06 LAB — LACTIC ACID, PLASMA
Lactic Acid, Venous: 2.7 mmol/L (ref 0.5–1.9)
Lactic Acid, Venous: 2.2 mmol/L (ref 0.5–1.9)

## 2021-10-06 LAB — PROCALCITONIN: Procalcitonin: 9.55 ng/mL

## 2021-10-06 LAB — PROTIME-INR
INR: 1.2 (ref 0.8–1.2)
Prothrombin Time: 14.8 seconds (ref 11.4–15.2)

## 2021-10-06 LAB — CBG MONITORING, ED
Glucose-Capillary: 111 mg/dL — ABNORMAL HIGH (ref 70–99)
Glucose-Capillary: 121 mg/dL — ABNORMAL HIGH (ref 70–99)

## 2021-10-06 LAB — GLUCOSE, CAPILLARY
Glucose-Capillary: 158 mg/dL — ABNORMAL HIGH (ref 70–99)
Glucose-Capillary: 190 mg/dL — ABNORMAL HIGH (ref 70–99)

## 2021-10-06 LAB — MAGNESIUM: Magnesium: 1.6 mg/dL — ABNORMAL LOW (ref 1.7–2.4)

## 2021-10-06 LAB — CORTISOL-AM, BLOOD: Cortisol - AM: 25.4 ug/dL — ABNORMAL HIGH (ref 6.7–22.6)

## 2021-10-06 MED ORDER — ASPIRIN 81 MG PO CHEW
81.0000 mg | CHEWABLE_TABLET | Freq: Every day | ORAL | Status: DC
  Administered 2021-10-06 – 2021-10-11 (×6): 81 mg via ORAL
  Filled 2021-10-06 (×6): qty 1

## 2021-10-06 MED ORDER — BUDESONIDE 0.5 MG/2ML IN SUSP
0.5000 mg | Freq: Two times a day (BID) | RESPIRATORY_TRACT | Status: DC
Start: 2021-10-06 — End: 2021-10-11
  Administered 2021-10-06 – 2021-10-11 (×11): 0.5 mg via RESPIRATORY_TRACT
  Filled 2021-10-06 (×11): qty 2

## 2021-10-06 MED ORDER — SODIUM CHLORIDE 0.9 % IV SOLN
2.0000 g | INTRAVENOUS | Status: DC
  Administered 2021-10-07 – 2021-10-11 (×5): 2 g via INTRAVENOUS
  Filled 2021-10-06 (×5): qty 20

## 2021-10-06 MED ORDER — ACETAMINOPHEN 650 MG RE SUPP: 650.0000 mg | Freq: Four times a day (QID) | RECTAL | Status: DC | PRN

## 2021-10-06 MED ORDER — ADULT MULTIVITAMIN W/MINERALS CH
1.0000 | ORAL_TABLET | Freq: Every day | ORAL | Status: DC
  Administered 2021-10-06 – 2021-10-11 (×6): 1 via ORAL
  Filled 2021-10-06 (×6): qty 1

## 2021-10-06 MED ORDER — IPRATROPIUM-ALBUTEROL 0.5-2.5 (3) MG/3ML IN SOLN
3.0000 mL | Freq: Four times a day (QID) | RESPIRATORY_TRACT | Status: DC
  Administered 2021-10-06 – 2021-10-10 (×16): 3 mL via RESPIRATORY_TRACT
  Filled 2021-10-06 (×16): qty 3

## 2021-10-06 MED ORDER — PANTOPRAZOLE SODIUM 40 MG PO TBEC
40.0000 mg | DELAYED_RELEASE_TABLET | ORAL | Status: DC
  Administered 2021-10-06 – 2021-10-10 (×3): 40 mg via ORAL
  Filled 2021-10-06 (×5): qty 1

## 2021-10-06 MED ORDER — POLYETHYLENE GLYCOL 3350 17 G PO PACK
17.0000 g | PACK | Freq: Every day | ORAL | Status: DC | PRN
  Administered 2021-10-08 – 2021-10-11 (×4): 17 g via ORAL
  Filled 2021-10-06 (×4): qty 1

## 2021-10-06 MED ORDER — ONDANSETRON HCL 4 MG PO TABS: 4.0000 mg | ORAL_TABLET | Freq: Four times a day (QID) | ORAL | Status: DC | PRN

## 2021-10-06 MED ORDER — ATORVASTATIN CALCIUM 40 MG PO TABS
40.0000 mg | ORAL_TABLET | Freq: Every day | ORAL | Status: DC
  Administered 2021-10-06 – 2021-10-11 (×6): 40 mg via ORAL
  Filled 2021-10-06 (×6): qty 1

## 2021-10-06 MED ORDER — HEPARIN SODIUM (PORCINE) 5000 UNIT/ML IJ SOLN
5000.0000 [IU] | Freq: Three times a day (TID) | INTRAMUSCULAR | Status: DC
  Administered 2021-10-06 – 2021-10-11 (×15): 5000 [IU] via SUBCUTANEOUS
  Filled 2021-10-06 (×15): qty 1

## 2021-10-06 MED ORDER — CARBIDOPA-LEVODOPA 25-100 MG PO TABS
1.5000 | ORAL_TABLET | Freq: Three times a day (TID) | ORAL | Status: DC
  Administered 2021-10-06 – 2021-10-11 (×16): 1.5 via ORAL
  Filled 2021-10-06: qty 1.5
  Filled 2021-10-06: qty 2
  Filled 2021-10-06: qty 1.5
  Filled 2021-10-06 (×4): qty 2
  Filled 2021-10-06: qty 1.5
  Filled 2021-10-06: qty 2
  Filled 2021-10-06: qty 1.5
  Filled 2021-10-06: qty 2
  Filled 2021-10-06 (×3): qty 1.5
  Filled 2021-10-06: qty 2
  Filled 2021-10-06 (×2): qty 1.5
  Filled 2021-10-06: qty 2
  Filled 2021-10-06: qty 1.5

## 2021-10-06 MED ORDER — ONDANSETRON HCL 4 MG/2ML IJ SOLN
4.0000 mg | Freq: Four times a day (QID) | INTRAMUSCULAR | Status: DC | PRN
Start: 2021-10-06 — End: 2021-10-11
  Administered 2021-10-09: 4 mg via INTRAVENOUS
  Filled 2021-10-06: qty 2

## 2021-10-06 MED ORDER — ACETAMINOPHEN 325 MG PO TABS
650.0000 mg | ORAL_TABLET | Freq: Four times a day (QID) | ORAL | Status: DC | PRN
  Administered 2021-10-07 – 2021-10-10 (×2): 650 mg via ORAL
  Filled 2021-10-06 (×2): qty 2

## 2021-10-06 MED ORDER — CARBIDOPA-LEVODOPA 25-100 MG PO TABS: 1.0000 | ORAL_TABLET | Freq: Three times a day (TID) | ORAL | Status: DC

## 2021-10-06 MED ORDER — OXYCODONE HCL 5 MG PO TABS
5.0000 mg | ORAL_TABLET | ORAL | Status: DC | PRN
  Administered 2021-10-06: 5 mg via ORAL
  Filled 2021-10-06 (×2): qty 1

## 2021-10-06 MED ORDER — CHLORHEXIDINE GLUCONATE CLOTH 2 % EX PADS
6.0000 | MEDICATED_PAD | Freq: Every day | CUTANEOUS | Status: DC
  Administered 2021-10-06 – 2021-10-11 (×5): 6 via TOPICAL

## 2021-10-06 NOTE — Progress Notes (Signed)
Have had to steadily go up on FIO2 during course of 12 hour shift.  Patient now at 80% FIO2 on Bipap. ?

## 2021-10-06 NOTE — ED Notes (Signed)
Consulted with RT. Pt O2 sat at 91%. Changed bipap O2 to 80% ?

## 2021-10-06 NOTE — Progress Notes (Signed)
?Progress Note ? ? ?Patient: Paul [name] HDQ:[id number] DOB: Oct 23, 1940 DOA: 10/05/2021     1 ?DOS: the patient was seen and examined on 10/06/2021 ?  ?Brief hospital admission narrative: ?As per H&P written by Dr. Clearence Ped on 10/05/21 ?SULEMAN GUNNING is a 81 y.o. male with medical history significant of diabetes mellitus type 2, GERD, hyperlipidemia, hypertension, Parkinson's disease, presents ED with a chief complaint of chest pain.  Patient reports he had sudden onset of chest pain yesterday after lunch.  Is associated with cough and shortness of breath.  His cough is productive of clear, yellow, reddish sputum.  He reports subjective fever and chills.  He took Tylenol which relieved his pain and his fever temporarily.  He describes the pain in his chest is a really bad " hurt."  He also had generalized weakness associated.  Patient denies any sick contacts.  Patient has no other complaints at this time. ?  ?Patient does not smoke, does not drink alcohol.  He is vaccinated for COVID.  Patient is full code. ? ?Assessment and Plan: ?* Sepsis due to pneumonia [facility]) ?-Sepsis due to pneumonia and bacteremia; blood cultures growing Streptococcus. ?-Patient met severe sepsis criteria at time of admission with tachypnea, soft blood pressure, elevated lactic acidosis, elevated WBCs and organ dysfunction his lungs and kidneys (respiratory distress, hypoxia and acute kidney injury).   ?-Fluid resuscitation has been provided lactic acid trending down ?-Patient has also demonstrated improvement in his respiratory distress and in the process of being weaned down from BiPAP into nasal cannula.   ?-Patient does not use oxygen supplementation at baseline.   ?-Continue current IV antibiotics ?-Continue to follow culture results and sensitivity ?-Continue supportive care and fluid resuscitation ?-Started on Pulmicort, as needed bronchodilators and the use of flutter valve and as needed mucolytic's.   ? ?AKI (acute kidney injury)  ([place]) ?-Cr increase from 0.93 at baseline up to 1.53 at time of admission ?-Appears to be associated with hypotension, continue use of nephrotoxic agents and sepsis physiology. ?-Continue aggressive fluid resuscitation ?-Follow electrolytes trend and renal function and stability. ?-Will avoid the use of nephrotoxic agents, contrast and hypotension. ? ?Acute respiratory failure with hypoxia ([place]) ?-Initially 87% on room air with increased work of breathing and tachypnea. ?-Patient ended requiring BiPAP with FiO2 up to 80% ?-Has responded to bronchodilator management, fluid resuscitation and antibiotic ?-Continue supportive care and wean off oxygen supplementation as tolerated. ?-Patient did not use oxygen at baseline. ? ? ?Type 2 diabetes mellitus with other specified complication ([place]) ?-Recent A1c 6.3 ?-Will hold oral hypoglycemic agents while inpatient ?-Patient started on sliding scale insulin ?-Follow-up vital signs and further adjust hypoglycemic regimen as needed. ? ?GERD (gastroesophageal reflux disease) ?-Continue protonix ? ? ?HTN (hypertension) ?-Blood pressure stable ?-Continue holding Norvasc and ARB in the setting of hypotension at time of presentation, sepsis features and acute kidney injury. ?-Maintain adequate hydration ?-Follow-up vital signs. ? ? ?HLD (hyperlipidemia) ?-Will continue the use of statin ?-Heart healthy diet discussed with patient.  ? ? ?Subjective:  ?Still short of breath and experiencing difficulty speaking in full sentences.  Requiring BiPAP with an FiO2 of 80%.  Afebrile.  Patient denying chest pain, nausea or vomiting. ? ?Physical Exam: ?Vitals:  ? 10/06/21 1130 10/06/21 1300 10/06/21 1330 10/06/21 1600  ?BP: (!) 128/59 (!) 123/59 118/66   ?Pulse: 93 84 85   ?Resp: (!) 29 (!) 27 (!) 29   ?Temp:      ?TempSrc:      ?  SpO2: 99% 98% 98% 91%  ? ?General exam: Alert, awake, oriented x 3; expressed feeling slightly better.  Still tachypneic and short of breath with minimal exertion.   Currently afebrile.  No chest pain, no nausea, no vomiting. ?Respiratory system: Positive rhonchi bilaterally (left more than right); no significant expiratory wheezing.  No using accessory muscles. ?Cardiovascular system:RRR. No murmurs, rubs, gallops.  No JVD. ?Gastrointestinal system: Abdomen is nondistended, soft and nontender. No organomegaly or masses felt. Normal bowel sounds heard. ?Central nervous system: Alert and oriented. No focal neurological deficits. ?Extremities: No cyanosis or clubbing. ?Skin: No petechiae. ?Psychiatry: Judgement and insight appear normal. Mood & affect appropriate.  ? ? ?Data Reviewed: ?-Patient requiring BiPAP with FiO2 up to 80%; on room air at time of admission 87% saturation. ?-Positive blood cultures suggesting Streptococcus infection ?-A1c 6.3 ?-Arterial blood gas demonstrating pH of 7.4, CO2 38 and PO2 72 ?-Magnesium level 1.6 ?-Lactic acid 4.9>>4.5>>2.7>>2.2 ?-Repeat chest x-ray demonstrating worsening airspace consolidation throughout the left lung in the setting of multilobar pneumonia with development of pleural effusion (paraneumonic). ? ?Family Communication: No family at bedside. ? ?Disposition: ?Status is: Inpatient ?Remains inpatient appropriate because: Requiring treatment with IV antibiotics for pneumonia/bacteremia and also further assistance for acute respiratory failure with hypoxia. ? ?Planned Discharge Destination: Home ? ? ?Author: ?Barton Dubois, MD ?10/06/2021 4:12 PM ? ?For on call review [email or web address].  ? ?

## 2021-10-06 NOTE — Progress Notes (Signed)
PHARMACY - PHYSICIAN COMMUNICATION ?CRITICAL VALUE ALERT - BLOOD CULTURE IDENTIFICATION (BCID) ? ?Paul [name] is an 81 y.o. male who presented to [facility] on 10/05/2021 with a chief complaint of fever, cough, SOB ? ?Assessment:  Patient reports he had sudden onset of chest pain yesterday after lunch.  Is associated with cough and shortness of breath.  His cough is productive of clear, yellow, reddish sputum.  He reports subjective fever and chills.  ? ?Name of physician (or Provider) Contacted: Dr. Dyann Kief ? ?Current antibiotics: Ceftriaxone 2gm IV q24h and azithromycin '500mg'$  IV q24h ? ?Changes to prescribed antibiotics recommended:  ?Patient is on recommended antibiotics - No changes needed ? ?Results for orders placed or performed during the hospital encounter of 10/05/21  ?Blood Culture ID Panel (Reflexed) (Collected: 10/05/2021  1:11 PM)  ?Result Value Ref Range  ? Enterococcus faecalis NOT DETECTED NOT DETECTED  ? Enterococcus Faecium NOT DETECTED NOT DETECTED  ? Listeria monocytogenes NOT DETECTED NOT DETECTED  ? Staphylococcus species NOT DETECTED NOT DETECTED  ? Staphylococcus aureus (BCID) NOT DETECTED NOT DETECTED  ? Staphylococcus epidermidis NOT DETECTED NOT DETECTED  ? Staphylococcus lugdunensis NOT DETECTED NOT DETECTED  ? Streptococcus species DETECTED (A) NOT DETECTED  ? Streptococcus agalactiae NOT DETECTED NOT DETECTED  ? Streptococcus pneumoniae DETECTED (A) NOT DETECTED  ? Streptococcus pyogenes NOT DETECTED NOT DETECTED  ? A.calcoaceticus-baumannii NOT DETECTED NOT DETECTED  ? Bacteroides fragilis NOT DETECTED NOT DETECTED  ? Enterobacterales NOT DETECTED NOT DETECTED  ? Enterobacter cloacae complex NOT DETECTED NOT DETECTED  ? Escherichia coli NOT DETECTED NOT DETECTED  ? Klebsiella aerogenes NOT DETECTED NOT DETECTED  ? Klebsiella oxytoca NOT DETECTED NOT DETECTED  ? Klebsiella pneumoniae NOT DETECTED NOT DETECTED  ? Proteus species NOT DETECTED NOT DETECTED  ? Salmonella species NOT  DETECTED NOT DETECTED  ? Serratia marcescens NOT DETECTED NOT DETECTED  ? Haemophilus influenzae NOT DETECTED NOT DETECTED  ? Neisseria meningitidis NOT DETECTED NOT DETECTED  ? Pseudomonas aeruginosa NOT DETECTED NOT DETECTED  ? Stenotrophomonas maltophilia NOT DETECTED NOT DETECTED  ? Candida albicans NOT DETECTED NOT DETECTED  ? Candida auris NOT DETECTED NOT DETECTED  ? Candida glabrata NOT DETECTED NOT DETECTED  ? Candida krusei NOT DETECTED NOT DETECTED  ? Candida parapsilosis NOT DETECTED NOT DETECTED  ? Candida tropicalis NOT DETECTED NOT DETECTED  ? Cryptococcus neoformans/gattii NOT DETECTED NOT DETECTED  ? ? ?Paul [name] ?10/06/2021  2:07 PM ? ?

## 2021-10-07 LAB — MRSA NEXT GEN BY PCR, NASAL: MRSA by PCR Next Gen: NOT DETECTED

## 2021-10-07 LAB — GLUCOSE, CAPILLARY
Glucose-Capillary: 125 mg/dL — ABNORMAL HIGH (ref 70–99)
Glucose-Capillary: 163 mg/dL — ABNORMAL HIGH (ref 70–99)
Glucose-Capillary: 152 mg/dL — ABNORMAL HIGH (ref 70–99)

## 2021-10-07 MED ORDER — METHOCARBAMOL 500 MG PO TABS
500.0000 mg | ORAL_TABLET | Freq: Three times a day (TID) | ORAL | Status: DC | PRN
  Administered 2021-10-07: 500 mg via ORAL
  Filled 2021-10-07: qty 1

## 2021-10-07 MED ORDER — MORPHINE SULFATE (PF) 2 MG/ML IV SOLN
1.0000 mg | Freq: Once | INTRAVENOUS | Status: AC
  Administered 2021-10-07: 1 mg via INTRAVENOUS
  Filled 2021-10-07: qty 1

## 2021-10-07 MED ORDER — METHOCARBAMOL 1000 MG/10ML IJ SOLN
500.0000 mg | Freq: Three times a day (TID) | INTRAVENOUS | Status: DC | PRN
  Filled 2021-10-07: qty 5

## 2021-10-07 MED ORDER — OXYCODONE HCL 5 MG PO TABS
5.0000 mg | ORAL_TABLET | Freq: Four times a day (QID) | ORAL | Status: DC | PRN
  Administered 2021-10-08 – 2021-10-11 (×5): 5 mg via ORAL
  Filled 2021-10-07 (×5): qty 1

## 2021-10-07 NOTE — Progress Notes (Signed)
?Progress Note ? ? ?Patient: Paul [name]:[id number] DOB: [birth year] DOA: 10/05/2021     2 ?DOS: the patient was seen and examined on 10/07/2021 ?  ?Brief hospital admission narrative: ?As per H&P written by Dr. Clearence Ped on 10/05/21 ?Paul [name] is a 81 y.o. male with medical history significant of diabetes mellitus type 2, GERD, hyperlipidemia, hypertension, Parkinson's disease, presents ED with a chief complaint of chest pain.  Patient reports he had sudden onset of chest pain yesterday after lunch.  Is associated with cough and shortness of breath.  His cough is productive of clear, yellow, reddish sputum.  He reports subjective fever and chills.  He took Tylenol which relieved his pain and his fever temporarily.  He describes the pain in his chest is a really bad " hurt."  He also had generalized weakness associated.  Patient denies any sick contacts.  Patient has no other complaints at this time. ?  ?Patient does not smoke, does not drink alcohol.  He is vaccinated for COVID.  Patient is full code. ? ?Assessment and Plan: ?* Sepsis due to pneumonia [facility]) ?-Sepsis due to streptococcal pneumonia and bacteremia ?-Patient has met criteria for severe sepsis at time of admission: Presenting with tachypnea, elevated lactic acid, elevated WBCs, soft blood pressure having hypoxia and acute kidney injury as part of organ dysfunction. ?-Lactic acid was also elevated. ?-Continue treatment with ceftriaxone (2 g intravenously daily). ?-Continue as needed bronchodilators, mucolytic's and the use of flutter valve. ?-Patient has also been started on Pulmicort. ?-Continue supportive care and follow clinical response. ?-Will continue to wean off oxygen supplementation as tolerated. ? ? ? ?AKI (acute kidney injury) ([place]) ?-Cr increase from 0.93 at baseline up to 1.53 at time of admission. ?-Continue to avoid nephrotoxic agents ?-Avoid hypotension and the use of contrast ?-Will continue fluid resuscitation and follow renal  function trend. ? ? ? ?Acute respiratory failure with hypoxia ([place]) ?-Initially 87% on room air with increased work of breathing and tachypnea. ?-Patient ended requiring BiPAP with FiO2 up to 80% ?-Has responded to bronchodilator management, fluid resuscitation and antibiotics ?-Currently off BiPAP for almost 24 hours; using high flow nasal cannula supplementation 6 to 8 L. ?-Continue supportive care and wean off oxygen as tolerated. ? ?Type 2 diabetes mellitus with other specified complication ([place]) ?-Recent A1c 6.3 ?-Will continue holding oral hypoglycemic agents while inpatient.  -Continue sliding scale insulin ?-Follow-up CBGs fluctuation and further adjust hypoglycemic regimen as needed. ? ?GERD (gastroesophageal reflux disease) ?-Continue protonix ? ? ?HTN (hypertension) ?-Blood pressure stable and fairly well controlled. ?-Continue holding Norvasc and ARB in the setting of acute sepsis with hypotension and acute kidney injury ?-Maintain adequate hydration and follow heart healthy diet. ? ? ?HLD (hyperlipidemia) ?-Continue statins ?-Continue heart healthy diet. ? ? ?Subjective:  ?Patient is currently off BiPAP; demonstrating improvement in breathing and denying chest pain.  Still requiring 6-8 high flow nasal cannula supplementation, tachypneic with minimal exertion and reported intermittent productive coughing spells.  Patient is afebrile. ? ?Physical Exam: ?Vitals:  ? 10/07/21 1400 10/07/21 1500 10/07/21 1553 10/07/21 1600  ?BP: (!) 126/52 (!) 124/53  (!) 119/54  ?Pulse: 88 83  83  ?Resp: (!) 28 (!) 27  (!) 31  ?Temp:      ?TempSrc:   (P) Oral   ?SpO2: 90% (!) 89%  (!) 88%  ?Weight:      ?Height:      ?General exam: Alert, awake, oriented x 3; reports feeling better and breathing easier;  patient almost 24 hours without the need for BiPAP.  Using 6 to 8 L high flow nasal cannula supplementation currently. ?Respiratory system: Positive rhonchi bilaterally (left more than right); no wheezing, no using accessory  muscle.  Tachypneic with minimal exertion. ?Cardiovascular system:RRR. No murmurs, rubs, gallops.  No JVD. ?Gastrointestinal system: Abdomen is obese, nondistended, soft and nontender. No organomegaly or masses felt. Normal bowel sounds heard. ?Central nervous system: Alert and oriented. No focal neurological deficits. ?Extremities: No cyanosis clubbing; trace edema appreciated bilaterally. ?Skin: No petechiae. ?Psychiatry: Judgement and insight appear normal. Mood & affect appropriate.  ? ?Data Reviewed: ?CBGs has remained stable; no other new labs today. ? ?-Patient requiring BiPAP with FiO2 up to 80%; on room air at time of admission 87% saturation. ?-Positive blood cultures suggesting Streptococcus infection ?-A1c 6.3 ?-Arterial blood gas demonstrating pH of 7.4, CO2 38 and PO2 72 ?-Magnesium level 1.6 ?-Lactic acid 4.9>>4.5>>2.7>>2.2 ?-Repeat chest x-ray demonstrating worsening airspace consolidation throughout the left lung in the setting of multilobar pneumonia with development of pleural effusion (paraneumonic). ? ?Family Communication: No family at bedside. ? ?Disposition: ?Status is: Inpatient ?Remains inpatient appropriate because: Requiring treatment with IV antibiotics for pneumonia/bacteremia and also further assistance for acute respiratory failure with hypoxia. ? ?Planned Discharge Destination: Home ? ? ?Author: ?Barton Dubois, MD ?10/07/2021 4:29 PM ? ?For on call review [email or web address].  ? ?

## 2021-10-08 ENCOUNTER — Inpatient Hospital Stay (HOSPITAL_COMMUNITY): Payer: Medicare Other

## 2021-10-08 DIAGNOSIS — E876 Hypokalemia: Secondary | ICD-10-CM | POA: Diagnosis not present

## 2021-10-08 DIAGNOSIS — K5900 Constipation, unspecified: Secondary | ICD-10-CM | POA: Diagnosis present

## 2021-10-08 LAB — CBC
HCT: 37 % — ABNORMAL LOW (ref 39.0–52.0)
Hemoglobin: 11.7 g/dL — ABNORMAL LOW (ref 13.0–17.0)
MCH: 31.4 pg (ref 26.0–34.0)
MCHC: 31.6 g/dL (ref 30.0–36.0)
MCV: 99.2 fL (ref 80.0–100.0)
Platelets: 216 10*3/uL (ref 150–400)
RBC: 3.73 MIL/uL — ABNORMAL LOW (ref 4.22–5.81)
RDW: 14 % (ref 11.5–15.5)
WBC: 11.6 10*3/uL — ABNORMAL HIGH (ref 4.0–10.5)
nRBC: 0 % (ref 0.0–0.2)

## 2021-10-08 LAB — BASIC METABOLIC PANEL
Anion gap: 8 (ref 5–15)
BUN: 19 mg/dL (ref 8–23)
CO2: 26 mmol/L (ref 22–32)
Calcium: 8.6 mg/dL — ABNORMAL LOW (ref 8.9–10.3)
Chloride: 103 mmol/L (ref 98–111)
Creatinine, Ser: 0.74 mg/dL (ref 0.61–1.24)
GFR, Estimated: >60 mL/min (ref >60)
Glucose, Bld: 101 mg/dL — ABNORMAL HIGH (ref 70–99)
Potassium: 3.3 mmol/L — ABNORMAL LOW (ref 3.5–5.1)
Sodium: 137 mmol/L (ref 135–145)

## 2021-10-08 LAB — CULTURE, BLOOD (ROUTINE X 2)

## 2021-10-08 LAB — MAGNESIUM: Magnesium: 2 mg/dL (ref 1.7–2.4)

## 2021-10-08 LAB — LACTIC ACID, PLASMA: Lactic Acid, Venous: 1.2 mmol/L (ref 0.5–1.9)

## 2021-10-08 LAB — GLUCOSE, CAPILLARY
Glucose-Capillary: 155 mg/dL — ABNORMAL HIGH (ref 70–99)
Glucose-Capillary: 103 mg/dL — ABNORMAL HIGH (ref 70–99)
Glucose-Capillary: 131 mg/dL — ABNORMAL HIGH (ref 70–99)
Glucose-Capillary: 141 mg/dL — ABNORMAL HIGH (ref 70–99)
Glucose-Capillary: 140 mg/dL — ABNORMAL HIGH (ref 70–99)

## 2021-10-08 MED ORDER — POTASSIUM CHLORIDE CRYS ER 20 MEQ PO TBCR
40.0000 meq | EXTENDED_RELEASE_TABLET | Freq: Once | ORAL | Status: AC
  Administered 2021-10-08: 40 meq via ORAL
  Filled 2021-10-08: qty 2

## 2021-10-08 MED ORDER — DOCUSATE SODIUM 100 MG PO CAPS
100.0000 mg | ORAL_CAPSULE | Freq: Two times a day (BID) | ORAL | Status: DC
  Administered 2021-10-08 – 2021-10-11 (×7): 100 mg via ORAL
  Filled 2021-10-08 (×7): qty 1

## 2021-10-08 MED ORDER — BISACODYL 10 MG RE SUPP: 10.0000 mg | Freq: Every day | RECTAL | Status: DC | PRN

## 2021-10-08 NOTE — Assessment & Plan Note (Signed)
-  Continue to maintain adequate hydration ?-Will use Colace and MiraLAX ?-Increase physical activity as tolerated and follow response. ?-If needed for moderate constipation Dulcolax suppository will be ordered. ?

## 2021-10-08 NOTE — Assessment & Plan Note (Addendum)
-  Will continue to replete electrolytes and follow trend. ?-Magnesium within normal limits. ?

## 2021-10-08 NOTE — Progress Notes (Signed)
?Progress Note ? ? ?Patient: Paul [name] XBD:[id number] DOB: 1941-08-01 DOA: 10/05/2021     3 ?DOS: the patient was seen and examined on 10/08/2021 ?  ?Brief hospital admission narrative: ?As per H&P written by Dr. Clearence Ped on 10/05/21 ?Paul [name] is a 81 y.o. male with medical history significant of diabetes mellitus type 2, GERD, hyperlipidemia, hypertension, Parkinson's disease, presents ED with a chief complaint of chest pain.  Patient reports he had sudden onset of chest pain yesterday after lunch.  Is associated with cough and shortness of breath.  His cough is productive of clear, yellow, reddish sputum.  He reports subjective fever and chills.  He took Tylenol which relieved his pain and his fever temporarily.  He describes the pain in his chest is a really bad " hurt."  He also had generalized weakness associated.  Patient denies any sick contacts.  Patient has no other complaints at this time. ?  ?Patient does not smoke, does not drink alcohol.  He is vaccinated for COVID.  Patient is full code. ? ?Assessment and Plan: ?* Sepsis due to pneumonia [facility]) ?-Sepsis due to streptococcal pneumonia and bacteremia ?-Patient has met criteria for severe sepsis at time of admission: Presenting with tachypnea, elevated lactic acid, elevated WBCs, soft blood pressure having hypoxia and acute kidney injury as part of organ dysfunction. ?-Lactic acid was also elevated. ?-Continue treatment with ceftriaxone (2 g intravenously daily). ?-Continue as needed bronchodilators, mucolytic's and the use of flutter valve. ?-Patient has also been started on Pulmicort. ?-Responding appropriately and currently down to 4 L nasal cannula supplementation.  Will transfer to telemetry bed. ?-Continue to wean oxygen supplementation as tolerated request physical therapy evaluation. ? ? ?Constipation ?-Continue to maintain adequate hydration ?-Will use Colace and MiraLAX ?-Increase physical activity as tolerated and follow  response. ?-If needed for moderate constipation Dulcolax suppository will be ordered. ? ?Hypokalemia ?-Will replete electrolytes and follow trend. ?-Check magnesium level. ? ?AKI (acute kidney injury) ([place]) ?-Cr increase from 0.93 at baseline up to 1.53 at time of admission. ?-Continue to avoid nephrotoxic agents ?-Avoid hypotension and the use of contrast ?-Will continue fluid resuscitation and follow renal function trend. ? ? ? ?Acute respiratory failure with hypoxia ([place]) ?-Initially 87% on room air with increased work of breathing and tachypnea. ?-Patient ended requiring BiPAP with FiO2 up to 80% ?-Has responded to bronchodilator management, fluid resuscitation and antibiotics ?-Currently off BiPAP for almost 24 hours; using high flow nasal cannula supplementation 6 to 8 L. ?-Continue supportive care and wean off oxygen as tolerated. ? ?Type 2 diabetes mellitus with other specified complication ([place]) ?-Recent A1c 6.3 ?-Will continue holding oral hypoglycemic agents while inpatient.  -Continue sliding scale insulin ?-Follow-up CBGs fluctuation and further adjust hypoglycemic regimen as needed. ? ?GERD (gastroesophageal reflux disease) ?-Continue protonix ? ? ?HTN (hypertension) ?-Blood pressure stable and fairly well controlled. ?-Continue holding Norvasc and ARB in the setting of acute sepsis with hypotension and acute kidney injury at time of admission. ?-Maintain adequate hydration and follow heart healthy diet. ?-Continue to follow vital signs. ?-Creatinine has overall improved renal function back to normal. ? ? ?HLD (hyperlipidemia) ?-Continue statins ?-Continue heart healthy diet. ? ? ?Subjective:  ?Afebrile, no chest pain, no nausea or vomiting.  Patient reporting some shoulder discomfort (which is not new).  Still short winded with activity but improving.  Positive intermittent productive coughing spells reported.  Patient expressed good appetite and is complaining of constipation.  Patient is using 4 L  nasal  cannula supplementation with good saturation at this time. ? ?Physical Exam: ?Vitals:  ? 10/08/21 0703 10/08/21 0713 10/08/21 0722 10/08/21 0730  ?BP:      ?Pulse:  71  81  ?Resp:  (!) 21  (!) 24  ?Temp:   98 ?F (36.7 ?C)   ?TempSrc:   Oral   ?SpO2: 92% 92%    ?Weight:      ?Height:      ? ?General exam: Alert, awake, oriented x 3; short winded with minimal activity and expressing intermittent productive coughing spells.  No chest pain, no nausea, no vomiting.  No fever. ?Respiratory system: Positive rhonchi bilaterally; no using accessory muscles. ?Cardiovascular system:RRR. No murmurs, rubs, gallops.  No JVD. ?Gastrointestinal system: Abdomen is nondistended, soft and nontender. No organomegaly or masses felt. Normal bowel sounds heard. ?Central nervous system: Alert and oriented. No focal neurological deficits. ?Extremities: No cyanosis or clubbing. ?Skin: No petechiae. ?Psychiatry: Judgement and insight appear normal. Mood & affect appropriate.  ? ? ?Data Reviewed: ?Stable CBGs ?-Chest x-ray demonstrating left lung infiltrates without further progression of pleural effusion.   ?Renal function back to normal with a creatinine of 0.74 and a stable sodium level.  Potassium 3.3 ?Lactic acid 1.2 ?WBC is trending down currently 11.6, normal hemoglobin and platelets count. ? ?Family Communication: Wife at bedside. ? ?Disposition: ?Status is: Inpatient ?Remains inpatient appropriate because: Requiring treatment with IV antibiotics for pneumonia/bacteremia and also further assistance for acute respiratory failure with hypoxia. ? ?Planned Discharge Destination: Home; unless patient is too deconditioned and will require rehabilitation at a facility.  Physical therapy evaluation has been requested. ? ? ?Author: ?Barton Dubois, MD ?10/08/2021 9:16 AM ? ?For on call review [email or web address].  ? ?

## 2021-10-08 NOTE — Progress Notes (Incomplete)
Patient stated has not had a stool since 10/03/21 when writer asked patient. No complaints of abdominal pain and patient states he is passing gas at times. PRN Miralax given and Dr Dyann Kief made aware. PO Colace ordered and also given. PRN Suppository also ordered. [id number] continue to monitor. ?

## 2021-10-09 LAB — GLUCOSE, CAPILLARY
Glucose-Capillary: 109 mg/dL — ABNORMAL HIGH (ref 70–99)
Glucose-Capillary: 135 mg/dL — ABNORMAL HIGH (ref 70–99)
Glucose-Capillary: 160 mg/dL — ABNORMAL HIGH (ref 70–99)
Glucose-Capillary: 124 mg/dL — ABNORMAL HIGH (ref 70–99)

## 2021-10-09 LAB — BASIC METABOLIC PANEL
Anion gap: 9 (ref 5–15)
BUN: 18 mg/dL (ref 8–23)
CO2: 26 mmol/L (ref 22–32)
Calcium: 8.5 mg/dL — ABNORMAL LOW (ref 8.9–10.3)
Chloride: 102 mmol/L (ref 98–111)
Creatinine, Ser: 0.7 mg/dL (ref 0.61–1.24)
GFR, Estimated: >60 mL/min (ref >60)
Glucose, Bld: 124 mg/dL — ABNORMAL HIGH (ref 70–99)
Potassium: 3.9 mmol/L (ref 3.5–5.1)
Sodium: 137 mmol/L (ref 135–145)

## 2021-10-09 NOTE — Plan of Care (Signed)
?  Problem: Acute Rehab PT Goals(only PT should resolve) ?Goal: Pt Will Go Supine/Side To Sit ?Outcome: Progressing ?Flowsheets (Taken 10/09/2021 1222) ?Pt will go Supine/Side to Sit: ? with modified independence ? with supervision ?Goal: Patient Will Transfer Sit To/From Stand ?Outcome: Progressing ?Flowsheets (Taken 10/09/2021 1222) ?Patient will transfer sit to/from stand: ? with modified independence ? with supervision ?Goal: Pt Will Transfer Bed To Chair/Chair To Bed ?Outcome: Progressing ?Flowsheets (Taken 10/09/2021 1222) ?Pt will Transfer Bed to Chair/Chair to Bed: ? with modified independence ? with supervision ?Goal: Pt Will Ambulate ?Outcome: Progressing ?Flowsheets (Taken 10/09/2021 1222) ?Pt will Ambulate: ? 75 feet ? with supervision ? with min guard assist ? with rolling walker ?  ?12:22 PM, 10/09/21 ?Lonell Grandchild, MPT ?Physical Therapist with [place] ?[facility] ?[phone] office ?[phone] mobile phone ? ?

## 2021-10-09 NOTE — Progress Notes (Signed)
?Progress Note ? ? ?Patient: Paul [name] SWN:[id number] DOB: March 14, 1941 DOA: 10/05/2021     4 ?DOS: the patient was seen and examined on 10/09/2021 ?  ?Brief hospital admission narrative: ?As per H&P written by Dr. Clearence Ped on 10/05/21 ?Paul [name] is a 81 y.o. male with medical history significant of diabetes mellitus type 2, GERD, hyperlipidemia, hypertension, Parkinson's disease, presents ED with a chief complaint of chest pain.  Patient reports he had sudden onset of chest pain yesterday after lunch.  Is associated with cough and shortness of breath.  His cough is productive of clear, yellow, reddish sputum.  He reports subjective fever and chills.  He took Tylenol which relieved his pain and his fever temporarily.  He describes the pain in his chest is a really bad " hurt."  He also had generalized weakness associated.  Patient denies any sick contacts.  Patient has no other complaints at this time. ?  ?Patient does not smoke, does not drink alcohol.  He is vaccinated for COVID.  Patient is full code. ? ?Assessment and Plan: ?* Sepsis due to pneumonia [facility]) ?-Sepsis due to streptococcal pneumonia and bacteremia ?-Patient has met criteria for severe sepsis at time of admission: Presenting with tachypnea, elevated lactic acid, elevated WBCs, soft blood pressure having hypoxia and acute kidney injury as part of organ dysfunction. ?-Lactic acid was also elevated. ?-Continue treatment with ceftriaxone (2 g intravenously daily). ?-Continue as needed bronchodilators, mucolytic's and the use of flutter valve. ?-Patient has also been started on Pulmicort. ?-Responding appropriately and currently down to 3-4 L nasal cannula supplementation.  Will transfer to telemetry bed. ?-Continue to wean oxygen supplementation as tolerated and request oxygen desaturation screening (patient open-minded about going home with oxygen supplementation if needed). ?-After 5 days of IV antibiotics as per ID recommendation patient will  be able to go home with oral antibiotics and complete a total of 10 days therapy. ? ? ?Constipation ?-Continue to maintain adequate hydration ?-Will use Colace and MiraLAX ?-Increase physical activity as tolerated and follow response. ?-If needed for moderate constipation Dulcolax suppository will be ordered. ? ?Hypokalemia ?-Will continue to replete electrolytes and follow trend. ?-Magnesium within normal limits. ? ?AKI (acute kidney injury) ([place]) ?-Cr increase from 0.93 at baseline up to 1.53 at time of admission. ?-Continue to avoid nephrotoxic agents ?-Avoid hypotension and the use of contrast ?-Will continue to maintain adequate hydration and follow renal function trend/stability ?-Creatinine 0.70 currently. ? ? ? ?Acute respiratory failure with hypoxia ([place]) ?-Initially 87% on room air with increased work of breathing and tachypnea. ?-Patient ended requiring BiPAP with FiO2 up to 80% ?-Has responded to bronchodilator management, fluid resuscitation and antibiotics ?-Currently off BiPAP for almost 48 hours; has come down to the use of 3-4 L nasal cannula supplementation. ?-Continue supportive care and wean off oxygen as tolerated. ?-Requesting desaturation screening. ? ?Type 2 diabetes mellitus with other specified complication ([place]) ?-Recent A1c 6.3 ?-Will continue holding oral hypoglycemic agents while inpatient. -Continue sliding scale insulin ?-Follow-up CBGs fluctuation and further adjust hypoglycemic regimen as needed. ? ?GERD (gastroesophageal reflux disease) ?-Continue protonix ? ? ?HTN (hypertension) ?-Blood pressure stable and fairly well controlled. ?-Continue holding Norvasc and ARB in the setting of acute sepsis with hypotension and acute kidney injury at time of admission.  Will be okay to resume antihypertensive agents at time of discharge. ?-Maintain adequate hydration and follow heart healthy diet. ?-Continue to follow vital signs. ?-Creatinine has overall improved renal function back to  normal. ? ? ?  HLD (hyperlipidemia) ?-Continue statins ?-Continue heart healthy diet. ? ? ?Subjective:  ?Reports feeling better today; has started to be more active and changing positions since transfer.  Using 3-4 L nasal cannula supplementation to maintain O2 sats; still short winded with activity and is present intermittent productive coughing spells. ? ?Physical Exam: ?Vitals:  ? 10/09/21 1106 10/09/21 1130 10/09/21 1540 10/09/21 1611  ?BP:  112/71  124/67  ?Pulse:  84  81  ?Resp:  18    ?Temp:  98.4 ?F (36.9 ?C)    ?TempSrc:  Oral    ?SpO2: 93% 92% 93% 90%  ?Weight:      ?Height:      ? ?General exam: Alert, awake, oriented x 3; weak, deconditioned, complaining of short winded sensation with activity and requiring 3-4 L nasal cannula supplementation.  No chest pain, no nausea, no vomiting. ?Respiratory system: Bilateral rhonchi; no using accessory muscles.  No wheezing appreciated on today's examination.  Patient is requiring 3-4 L nasal cannula supplementation. ?Cardiovascular system:RRR. No murmurs, rubs, gallops.  No JVD. ?Gastrointestinal system: Abdomen is nondistended, soft and nontender. No organomegaly or masses felt. Normal bowel sounds heard. ?Central nervous system: Alert and oriented. No focal neurological deficits. ?Extremities: No cyanosis or clubbing. ?Skin: No petechiae. ?Psychiatry: Judgement and insight appear normal. Mood & affect appropriate.  ? ? ?Data Reviewed: ?Blood sugar in the 120 range ?Basic metabolic panel demonstrating sodium of 137, potassium 3.9, BUN 18 and creatinine 0.70. ? ? ?Family Communication: Wife at bedside. ? ?Disposition: ?Status is: Inpatient ?Remains inpatient appropriate because: Home with home health services in the next 24 hours.  Will check desaturation screening.  Continue current IV antibiotics. ? ?Planned Discharge Destination: Home with home health services. ? ? ?Author: ?Barton Dubois, MD ?10/09/2021 6:28 PM ? ?For on call review [email or web address].  ? ?

## 2021-10-09 NOTE — TOC Initial Note (Signed)
Transition of Care (TOC) - Initial/Assessment Note  ? ? ?Patient Details  ?Name: Paul [name] ?MRN: [id number] ?Date of Birth: May 29, 1941 ? ?Transition of Care (TOC) CM/SW Contact:    ?Boneta Lucks, RN ?Phone Number: ?10/09/2021, 1:34 PM ? ?Clinical Narrative:       Patient admitted with sepsis due to pneumonia. Working on Chief Financial Officer today. TOC watching for home oxygen need. PT recommended HHPT. Wife is agreeable with no preferences. Georgina Snell with [facility] accepted the referral. MD aware to order PT/OT. Discharge planning for tomorrow.           ? ? ?Expected Discharge Plan: [place] ?Barriers to Discharge: Continued Medical Work up ? ?Patient Goals and CMS Choice ?Patient states their goals for this hospitalization and ongoing recovery are:: to go home ?CMS Medicare.gov Compare Post Acute Care list provided to:: Patient Represenative (must comment) ?Choice offered to / list presented to : Spouse ? ?Expected Discharge Plan and Services ?Expected Discharge Plan: [place] ?  ?  ? Living arrangements for the past 2 months: [place] ?                ?   ?[place] Agency: [facility] ?Date HH Agency Contacted: 10/09/21 ?Time [place]: 8416 ?Representative spoke with at [place]: Georgina Snell ? ?Prior Living Arrangements/Services ?Living arrangements for the past 2 months: [place] ?Lives with:: Spouse ?Patient language and need for interpreter reviewed:: Yes ?Do you feel safe going back to the place where you live?: Yes      ?Need for Family Participation in Patient Care: Yes (Comment) ?Care giver support system in place?: Yes (comment) ?  ?Criminal Activity/Legal Involvement Pertinent to Current Situation/Hospitalization: No - Comment as needed ? ?Activities of Daily Living ?Home Assistive Devices/Equipment: Eyeglasses ?ADL Screening (condition at time of admission) ?Patient's cognitive ability adequate to safely complete daily activities?:  Yes ?Is the patient deaf or have difficulty hearing?: Yes ?Does the patient have difficulty seeing, even when wearing glasses/contacts?: No ?Does the patient have difficulty concentrating, remembering, or making decisions?: No ?Patient able to express need for assistance with ADLs?: Yes ?Does the patient have difficulty dressing or bathing?: No ?Independently performs ADLs?: Yes (appropriate for developmental age) ?Does the patient have difficulty walking or climbing stairs?: No ?Weakness of Legs: Both ?Weakness of Arms/Hands: Both ? ?Permission Sought/Granted ?   ?   ? Permission granted to share info w Relationship: Wife ?   ?Emotional Assessment ?  ?  ?Affect (typically observed): Accepting ?  ?Alcohol / Substance Use: Not Applicable ?Psych Involvement: No (comment) ? ?Admission diagnosis:  SOB (shortness of breath) [R06.02] ?Severe sepsis ([place]) [A41.9, R65.20] ?Sepsis due to pneumonia ([place]) [J18.9, A41.9] ?Community acquired pneumonia of left lung, unspecified part of lung [J18.9] ?Patient Active Problem List  ? Diagnosis Date Noted  ? Hypokalemia 10/08/2021  ? Constipation 10/08/2021  ? Sepsis due to pneumonia ([place]) 10/05/2021  ? Acute respiratory failure with hypoxia ([place]) 10/05/2021  ? AKI (acute kidney injury) ([place]) 10/05/2021  ? Parkinson's disease ([place]) 12/03/2017  ? Type 2 diabetes mellitus with other specified complication ([place]) 60/63/0160  ? GERD (gastroesophageal reflux disease) 11/20/2016  ? Adenocarcinoma of prostate ([place])   ? Iron deficiency anemia 10/16/2012  ? HLD (hyperlipidemia) 04/05/2010  ? Overweight 04/05/2010  ? HTN (hypertension) 04/05/2010  ? FATIGUE / MALAISE 04/05/2010  ? ?PCP:  Dettinger, Fransisca Kaufmann, MD ?Pharmacy:   ?[facility] [phone] -  [place], [place] [address] ?Phone: [phone] Fax: [phone] ? ?Readmission Risk Interventions ?Readmission Risk Prevention Plan 10/09/2021  ?Transportation Screening Complete  ?PCP or Specialist Appt  within 5-7 Days Not Complete  ?Home Care Screening Complete  ?Medication Review (RN CM) Complete  ?Some recent data might be hidden  ? ? ? ?

## 2021-10-09 NOTE — Progress Notes (Signed)
?   10/08/21 2248  ?Assess: MEWS Score  ?Temp 98.7 ?F (37.1 ?C)  ?BP (!) 153/72  ?Pulse Rate 88  ?Resp (!) 28  ?SpO2 91 %  ?O2 Device HFNC  ?O2 Flow Rate (L/min) 4 L/min  ?Assess: MEWS Score  ?MEWS Temp 0  ?MEWS Systolic 0  ?MEWS Pulse 0  ?MEWS RR 2  ?MEWS LOC 0  ?MEWS Score 2  ?MEWS Score Color Yellow  ?Assess: if the MEWS score is Yellow or Red  ?Were vital signs taken at a resting state? Yes  ?Focused Assessment No change from prior assessment  ?Early Detection of Sepsis Score *See Row Information* Low  ?MEWS guidelines implemented *See Row Information* Yes  ?Treat  ?MEWS Interventions Other (Comment) ?(will continue to monitor)  ?Pain Scale 0-10  ?Pain Score 0  ?Take Vital Signs  ?Increase Vital Sign Frequency  Yellow: Q 2hr X 2 then Q 4hr X 2, if remains yellow, continue Q 4hrs  ?Escalate  ?MEWS: Escalate Yellow: discuss with charge nurse/RN and consider discussing with provider and RRT  ?Notify: Charge Nurse/RN  ?Name of Charge Nurse/RN Notified [place]  ?Date Charge Nurse/RN Notified 10/09/21  ?Time Charge Nurse/RN Notified 2250  ?Document  ?Patient Outcome Other (Comment) ?(remains stable and on unit)  ?Progress note created (see row info) Yes  ? ? ?

## 2021-10-09 NOTE — Evaluation (Signed)
Physical Therapy Evaluation ?Patient Details ?Name: Paul [name] ?MRN: [id number] ?DOB: 1940-11-03 ?Today's Date: 10/09/2021 ? ?History of Present Illness ? Paul [name] is a 81 y.o. male with medical history significant of diabetes mellitus type 2, GERD, hyperlipidemia, hypertension, Parkinson's disease, presents ED with a chief complaint of chest pain.  Patient reports he had sudden onset of chest pain yesterday after lunch.  Is associated with cough and shortness of breath.  His cough is productive of clear, yellow, reddish sputum.  He reports subjective fever and chills.  He took Tylenol which relieved his pain and his fever temporarily.  He describes the pain in his chest is a really bad " hurt."  He also had generalized weakness associated.  Patient denies any sick contacts.  Patient has no other complaints at this time. ?  ?Clinical Impression ? Patient demonstrates slow labored movement for sitting up at bedside requiring Min guard assist for scooting to EOB, has to lean on nearby objects for support when not using AD, required use of RW for safety and able to ambulate in hallway without loss of balance, but limited mostly due to fatigue and SpO2 dropping from 93% to 80% while on room air.  Patient tolerated sitting up in chair while on 2 LPM O2 with SpO2 at 89-92% with his spouse in room after therapy - nurse notified.  Patient will benefit from continued skilled physical therapy in hospital and recommended venue below to increase strength, balance, endurance for safe ADLs and gait.  ? ?   ?   ? ?Recommendations for follow up therapy are one component of a multi-disciplinary discharge planning process, led by the attending physician.  Recommendations may be updated based on patient status, additional functional criteria and insurance authorization. ? ?Follow Up Recommendations Home health PT ? ?  ?Assistance Recommended at Discharge Set up Supervision/Assistance  ?Patient can return home with the  following ? A little help with walking and/or transfers;A little help with bathing/dressing/bathroom;Help with stairs or ramp for entrance;Assistance with cooking/housework ? ?  ?Equipment Recommendations None recommended by PT  ?Recommendations for Other Services ?    ?  ?Functional Status Assessment Patient has had a recent decline in their functional status and demonstrates the ability to make significant improvements in function in a reasonable and predictable amount of time.  ? ?  ?Precautions / Restrictions Precautions ?Precautions: Fall ?Restrictions ?Weight Bearing Restrictions: No  ? ?  ? ?Mobility ? Bed Mobility ?Overal bed mobility: Needs Assistance ?Bed Mobility: Supine to Sit ?  ?  ?Supine to sit: Min guard ?  ?  ?General bed mobility comments: increased time, labored movement ?  ? ?Transfers ?Overall transfer level: Needs assistance ?Equipment used: Rolling walker (2 wheels), None ?Transfers: Sit to/from Stand, Bed to chair/wheelchair/BSC ?Sit to Stand: Min guard ?  ?Step pivot transfers: Min guard ?  ?  ?  ?General transfer comment: slow labored movement having to lean on armrest of chair for support when not using an AD, safer using RW ?  ? ?Ambulation/Gait ?Ambulation/Gait assistance: Min guard, Min assist ?Gait Distance (Feet): 40 Feet ?Assistive device: Rolling walker (2 wheels) ?Gait Pattern/deviations: Decreased step length - right, Decreased step length - left, Decreased stride length ?Gait velocity: decreased ?  ?  ?General Gait Details: slightly labored cadence without loss of balance, limited mostly due to fatigue, mild SOB with SpO2 dropping from 93% to 80% while on room air ? ?Stairs ?  ?  ?  ?  ?  ? ?  Wheelchair Mobility ?  ? ?Modified Rankin (Stroke Patients Only) ?  ? ?  ? ?Balance Overall balance assessment: Needs assistance ?Sitting-balance support: Feet supported, No upper extremity supported ?Sitting balance-Leahy Scale: Good ?Sitting balance - Comments: seated at EOB ?  ?Standing  balance support: During functional activity, No upper extremity supported ?Standing balance-Leahy Scale: Poor ?Standing balance comment: fair/good using RW ?  ?  ?  ?  ?  ?  ?  ?  ?  ?  ?  ?   ? ? ? ?Pertinent Vitals/Pain Pain Assessment ?Pain Assessment: No/denies pain  ? ? ?Home Living Family/patient expects to be discharged to:: Private residence ?Living Arrangements: Spouse/significant other ?Available Help at Discharge: Family;Available 24 hours/day ?Type of Home: House ?Home Access: Stairs to enter ?Entrance Stairs-Rails: None ?Entrance Stairs-Number of Steps: 1 ?  ?Home Layout: One level ?Home Equipment: Conservation officer, nature (2 wheels);Cane - single point;Shower seat ?   ?  ?Prior Function Prior Level of Function : Independent/Modified Independent ?  ?  ?  ?  ?  ?  ?Mobility Comments: Hydrographic surveyor, drives ?ADLs Comments: Independent ?  ? ? ?Hand Dominance  ?   ? ?  ?Extremity/Trunk Assessment  ? Upper Extremity Assessment ?Upper Extremity Assessment: Generalized weakness ?  ? ?  ?  ? ?Cervical / Trunk Assessment ?Cervical / Trunk Assessment: Normal  ?Communication  ? Communication: No difficulties  ?Cognition Arousal/Alertness: Awake/alert ?Behavior During Therapy: [facility] for tasks assessed/performed ?Overall Cognitive Status: Within Functional Limits for tasks assessed ?  ?  ?  ?  ?  ?  ?  ?  ?  ?  ?  ?  ?  ?  ?  ?  ?  ?  ?  ? ?  ?General Comments   ? ?  ?Exercises    ? ?Assessment/Plan  ?  ?PT Assessment Patient needs continued PT services  ?PT Problem List Decreased strength;Decreased activity tolerance;Decreased balance;Decreased mobility ? ?   ?  ?PT Treatment Interventions DME instruction;Gait training;Stair training;Functional mobility training;Therapeutic activities;Therapeutic exercise;Balance training;Patient/family education   ? ?PT Goals (Current goals can be found in the Care Plan section)  ?Acute Rehab PT Goals ?Patient Stated Goal: return home with family to assist ?PT Goal Formulation: With  patient/family ?Time For Goal Achievement: 10/16/21 ?Potential to Achieve Goals: Good ? ?  ?Frequency Min 3X/week ?  ? ? ?Co-evaluation   ?  ?  ?  ?  ? ? ?  ?AM-PAC PT "6 Clicks" Mobility  ?Outcome Measure Help needed turning from your back to your side while in a flat bed without using bedrails?: None ?Help needed moving from lying on your back to sitting on the side of a flat bed without using bedrails?: A Little ?Help needed moving to and from a bed to a chair (including a wheelchair)?: A Little ?Help needed standing up from a chair using your arms (e.g., wheelchair or bedside chair)?: A Little ?Help needed to walk in hospital room?: A Little ?Help needed climbing 3-5 steps with a railing? : A Lot ?6 Click Score: 18 ? ?  ?End of Session Equipment Utilized During Treatment: Oxygen ?Activity Tolerance: Patient tolerated treatment well;Patient limited by fatigue ?Patient left: in chair;with call bell/phone within reach;with family/visitor present ?Nurse Communication: Mobility status ?PT Visit Diagnosis: Unsteadiness on feet (R26.81);Other abnormalities of gait and mobility (R26.89);Muscle weakness (generalized) (M62.81) ?  ? ?Time: 0350-0938 ?PT Time Calculation (min) (ACUTE ONLY): 26 min ? ? ?Charges:   PT Evaluation ?$PT Eval  Moderate Complexity: 1 Mod ?PT Treatments ?$Therapeutic Activity: 23-37 mins ?  ?   ? ? ?12:21 PM, 10/09/21 ?Lonell Grandchild, MPT ?Physical Therapist with [place] ?[facility] ?[phone] office ?[phone] mobile phone ? ? ?

## 2021-10-10 ENCOUNTER — Inpatient Hospital Stay (HOSPITAL_COMMUNITY): Payer: Medicare Other

## 2021-10-10 DIAGNOSIS — R7881 Bacteremia: Secondary | ICD-10-CM

## 2021-10-10 LAB — ECHOCARDIOGRAM COMPLETE
AR max vel: 2.97 cm2
AV Area VTI: 3.03 cm2
AV Area mean vel: 2.74 cm2
AV Mean grad: 3 mmHg
AV Peak grad: 6.9 mmHg
Ao pk vel: 1.31 m/s
Area-P 1/2: 4.89 cm2
Calc EF: 64.8 %
Height: 67 in
MV VTI: 2.91 cm2
S' Lateral: 3.2 cm
Single Plane A2C EF: 63.5 %
Single Plane A4C EF: 60.2 %
Weight: 3234.59 oz

## 2021-10-10 LAB — CULTURE, BLOOD (ROUTINE X 2): Culture: NO GROWTH

## 2021-10-10 LAB — GLUCOSE, CAPILLARY
Glucose-Capillary: 101 mg/dL — ABNORMAL HIGH (ref 70–99)
Glucose-Capillary: 108 mg/dL — ABNORMAL HIGH (ref 70–99)
Glucose-Capillary: 145 mg/dL — ABNORMAL HIGH (ref 70–99)
Glucose-Capillary: 122 mg/dL — ABNORMAL HIGH (ref 70–99)

## 2021-10-10 MED ORDER — IPRATROPIUM-ALBUTEROL 0.5-2.5 (3) MG/3ML IN SOLN
3.0000 mL | Freq: Two times a day (BID) | RESPIRATORY_TRACT | Status: DC
  Administered 2021-10-10 – 2021-10-11 (×2): 3 mL via RESPIRATORY_TRACT
  Filled 2021-10-10 (×2): qty 3

## 2021-10-10 NOTE — Progress Notes (Signed)
Patient complained of a headache this am, tylenol given and no complaints since then. He sat up in the chair for awhile and ambulated with 4L of 02 and stats stayed at 89-90%, ambulating with 5L 02 stats were at 92%. ?

## 2021-10-10 NOTE — Progress Notes (Signed)
*  PRELIMINARY RESULTS* ?Echocardiogram ?2D Echocardiogram has been performed. ? ?Paul [name] ?10/10/2021, 2:38 PM ?

## 2021-10-10 NOTE — Progress Notes (Signed)
Slept fairly well throughout the night. No complaints of pain or discomfort. No distress noted.  ?

## 2021-10-10 NOTE — Plan of Care (Signed)
  Problem: Education: Goal: Knowledge of General Education information will improve Description Including pain rating scale, medication(s)/side effects and non-pharmacologic comfort measures Outcome: Progressing   Problem: Health Behavior/Discharge Planning: Goal: Ability to manage health-related needs will improve Outcome: Progressing   

## 2021-10-10 NOTE — Progress Notes (Signed)
?Progress Note ? ? ?Patient: Paul [name] JJH:[id number] DOB: Aug 31, 1940 DOA: 10/05/2021     5 ?DOS: the patient was seen and examined on 10/10/2021 ?  ?Brief hospital admission narrative: ?As per H&P written by Dr. Clearence Ped on 10/05/21 ?Paul [name] is a 81 y.o. male with medical history significant of diabetes mellitus type 2, GERD, hyperlipidemia, hypertension, Parkinson's disease, presents ED with a chief complaint of chest pain.  Patient reports he had sudden onset of chest pain yesterday after lunch.  Is associated with cough and shortness of breath.  His cough is productive of clear, yellow, reddish sputum.  He reports subjective fever and chills.  He took Tylenol which relieved his pain and his fever temporarily.  He describes the pain in his chest is a really bad " hurt."  He also had generalized weakness associated.  Patient denies any sick contacts.  Patient has no other complaints at this time. ?  ?Patient does not smoke, does not drink alcohol.  He is vaccinated for COVID.  Patient is full code. ? ?Assessment and Plan: ?* Sepsis due to pneumonia [facility]) ?-Sepsis due to streptococcal pneumonia and bacteremia ?-Patient has met criteria for severe sepsis at time of admission: -Continue IV antibiotics, bronchodilators and mucolytics ?-After 5 days of IV antibiotics as per ID recommendation patient will be able to go home with oral antibiotics and complete a total of 10 days therapy. ? ?-Acute hypoxic respiratory failure ?-Continues to require 4 to 5 L of oxygen via nasal cannula ?-Desaturates easily with minimum activity ?-Dyspnea on exertion noted ?-Repeat chest x-ray in a.m---if effusion may benefit from thoracentesis ?-No longer requiring BiPAP ? ?Constipation ?-Continue to maintain adequate hydration ?-Will use Colace and MiraLAX ?-Increase physical activity as tolerated and follow response. ?-If needed for moderate constipation Dulcolax suppository will be ordered. ? ?AKI (acute kidney injury)  ([place]) ?-Continue to avoid nephrotoxic agents ?-Avoid hypotension and the use of contrast ?creatinine was 1.53 on admission, normalized at this time ? ?Type 2 diabetes mellitus with other specified complication ([place]) ?-Recent A1c 6.3 ?-Will continue holding oral hypoglycemic agents while inpatient. -Continue sliding scale insulin ?-Follow-up CBGs fluctuation and further adjust hypoglycemic regimen as needed. ? ?GERD (gastroesophageal reflux disease) ?-Continue protonix ? ? ?HTN (hypertension) ?-Blood pressure stable and fairly well controlled. ?-Continue holding Norvasc and ARB in the setting of acute sepsis with hypotension and acute kidney injury at time of admission.  Will be okay to resume antihypertensive agents at time of discharge. ?-Maintain adequate hydration and follow heart healthy diet. ?-Continue to follow vital signs. ?-Creatinine has overall improved renal function back to normal. ? ? ?HLD (hyperlipidemia) ?-Continue statins ?-Continue heart healthy diet. ? ? ?Subjective:  ? wife at bedside, ?Continues to require 4 to 5 L of oxygen via nasal cannula ?-Desaturates easily with minimum activity ?-Dyspnea on exertion noted ?-Cough persist ? ?Physical Exam: ?Vitals:  ? 10/09/21 2131 10/10/21 0542 10/10/21 0759 10/10/21 1202  ?BP: 137/66 132/76  116/72  ?Pulse: 98 85  80  ?Resp: 20 18  18   ?Temp: 98.6 ?F (37 ?C) 98.9 ?F (37.2 ?C)  98.5 ?F (36.9 ?C)  ?TempSrc: Oral Oral  Oral  ?SpO2: 96% 94% 96% 91%  ?Weight:      ?Height:      ? ? ?Physical Exam ? ?Gen:- Awake Alert, dyspnea on exertion noted  ?HEENT:- [place].AT, No sclera icterus ?Nose- [place] 5L/min ?Neck-Supple Neck,No JVD,.  ?Lungs-minutes breath sounds with scattered rhonchi ?CV- S1, S2 normal, RRR ?Abd-  +  ve B.Sounds, Abd Soft, No tenderness,    ?Extremity/Skin:- No  edema,   good pedal pulses  ?Psych-affect is appropriate, oriented x3 ?Neuro-generalized weakness, no new focal deficits, no tremors ? ? ?Family Communication: Wife at  bedside. ? ?Disposition: ?Status is: Inpatient ?Remains inpatient appropriate because: Home with home health services in the next 24 hours.  Most likely need home O2 ?- continue current IV antibiotics. ? ?Planned Discharge Destination: Home with home health services. ? ? ?Author: ?Roxan Hockey, MD ?10/10/2021 7:00 PM ? ?For on call review [email or web address]  ? ?

## 2021-10-11 ENCOUNTER — Inpatient Hospital Stay (HOSPITAL_COMMUNITY): Payer: Medicare Other

## 2021-10-11 LAB — BASIC METABOLIC PANEL
Anion gap: 8 (ref 5–15)
BUN: 15 mg/dL (ref 8–23)
CO2: 28 mmol/L (ref 22–32)
Calcium: 8.6 mg/dL — ABNORMAL LOW (ref 8.9–10.3)
Chloride: 100 mmol/L (ref 98–111)
Creatinine, Ser: 0.69 mg/dL (ref 0.61–1.24)
GFR, Estimated: >60 mL/min (ref >60)
Glucose, Bld: 102 mg/dL — ABNORMAL HIGH (ref 70–99)
Potassium: 4.2 mmol/L (ref 3.5–5.1)
Sodium: 136 mmol/L (ref 135–145)

## 2021-10-11 LAB — CBC
HCT: 36.6 % — ABNORMAL LOW (ref 39.0–52.0)
Hemoglobin: 11.7 g/dL — ABNORMAL LOW (ref 13.0–17.0)
MCH: 31.4 pg (ref 26.0–34.0)
MCHC: 32 g/dL (ref 30.0–36.0)
MCV: 98.1 fL (ref 80.0–100.0)
Platelets: 359 10*3/uL (ref 150–400)
RBC: 3.73 MIL/uL — ABNORMAL LOW (ref 4.22–5.81)
RDW: 14.2 % (ref 11.5–15.5)
WBC: 9 10*3/uL (ref 4.0–10.5)
nRBC: 0 % (ref 0.0–0.2)

## 2021-10-11 LAB — GLUCOSE, CAPILLARY
Glucose-Capillary: 113 mg/dL — ABNORMAL HIGH (ref 70–99)
Glucose-Capillary: 176 mg/dL — ABNORMAL HIGH (ref 70–99)

## 2021-10-11 MED ORDER — AMOXICILLIN-POT CLAVULANATE 875-125 MG PO TABS: 1.0000 | ORAL_TABLET | Freq: Two times a day (BID) | ORAL | 0 refills | Status: AC

## 2021-10-11 MED ORDER — GUAIFENESIN ER 600 MG PO TB12: 600.0000 mg | ORAL_TABLET | Freq: Two times a day (BID) | ORAL | 0 refills | Status: AC

## 2021-10-11 MED ORDER — ALBUTEROL SULFATE HFA 108 (90 BASE) MCG/ACT IN AERS: 2.0000 | INHALATION_SPRAY | Freq: Four times a day (QID) | RESPIRATORY_TRACT | 2 refills | Status: DC | PRN

## 2021-10-11 MED ORDER — ASPIRIN 81 MG PO TABS: 81.0000 mg | ORAL_TABLET | Freq: Every day | ORAL | 3 refills | Status: AC

## 2021-10-11 NOTE — Discharge Summary (Signed)
Paul [name], is a 81 y.o. male  DOB 09-01-1940  MRN [id number].  Admission date:  10/05/2021  Admitting Physician  Rolla Plate, DO  Discharge Date:  10/11/2021   Primary MD  Dettinger, Fransisca Kaufmann, MD  Recommendations for primary care physician for things to follow:   1) take Augmentin antibiotic as prescribed for Streptococcus pneumonia infection your blood and lungs 2) repeat CBC and BMP blood test with primary care physician within a week advised 3)you need oxygen at home at 4 L via nasal cannula continuously while awake and while asleep--- smoking or having open fires around oxygen can cause fire, significant injury and death 4) avoid excessively strenuous activity for the next week or so--until seen for recheck by your primary care physician 5) repeat chest x-ray in 1 to 2 weeks with a primary care physician advised  Admission Diagnosis  SOB (shortness of breath) [R06.02] Severe sepsis ([place]) [A41.9, R65.20] Sepsis due to pneumonia ([place]) [J18.9, A41.9] Community acquired pneumonia of left lung, unspecified part of lung [J18.9]   Discharge Diagnosis  SOB (shortness of breath) [R06.02] Severe sepsis ([place]) [A41.9, R65.20] Sepsis due to pneumonia ([place]) [J18.9, A41.9] Community acquired pneumonia of left lung, unspecified part of lung [J18.9]    Principal Problem:   Sepsis due to pneumonia ([place]) Active Problems:   HLD (hyperlipidemia)   HTN (hypertension)   GERD (gastroesophageal reflux disease)   Type 2 diabetes mellitus with other specified complication ([place])   Acute respiratory failure with hypoxia ([place])   AKI (acute kidney injury) ([place])   Hypokalemia   Constipation      Past Medical History:  Diagnosis Date   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; s/p prostatectomy; involving both lobes; No extraprostatic involvement; neg margin;  pT2c, pN0, M0    Diabetes mellitus  without complication (HCC)    Full dentures    does not wear bottoms   GERD (gastroesophageal reflux disease)    Hyperlipidemia    Hypertension    Iron deficiency anemia    presumped malabsorption; neg EGD/ capsule endos/colosopcy   Iron deficiency anemia, unspecified 10/16/2012   Kidney stones    Parkinson's disease [facility])     Past Surgical History:  Procedure Laterality Date   COLONOSCOPY     HEMORRHOID SURGERY N/A 07/05/2014   Procedure: HEMORRHOIDECTOMY;  Surgeon: Erroll Luna, MD;  Location: [place];  Service: General;  Laterality: N/A;   ORIF RADIAL FRACTURE     right-as child   robtic assisted laparoscopic     radical prostatectomy and bilateral pelvic lymphadenectomy   UPPER GI ENDOSCOPY         HPI  from the history and physical done on the day of admission:     HPI: Paul [name] is a 81 y.o. male with medical history significant of diabetes mellitus type 2, GERD, hyperlipidemia, hypertension, Parkinson's disease, presents ED with a chief complaint of chest pain.  Patient reports he had sudden onset of chest pain yesterday  after lunch.  Is associated with cough and shortness of breath.  His cough is productive of clear, yellow, reddish sputum.  He reports subjective fever and chills.  He took Tylenol which relieved his pain and his fever temporarily.  He describes the pain in his chest is a really bad " hurt."  He also had generalized weakness associated.  Patient denies any sick contacts.  Patient has no other complaints at this time.   Patient does not smoke, does not drink alcohol.  He is vaccinated for COVID.  Patient is full code.   Review of Systems: As mentioned in the history of present illness. All other systems reviewed and are negative.    Hospital Course:     Sepsis due to pneumonia ([place]) -Sepsis due to streptococcal pneumonia and bacteremia -Patient met criteria for severe sepsis at time of admission:  -Treated with bronchodilators  and mucolytics -Treated with IV Rocephin -ID recommendations appreciated -Discharge home on p.o. Augmentin -Echo without endocarditis   -Acute hypoxic respiratory failure -Continues to require 4 L of oxygen via nasal cannula -Desaturates easily with minimum activity -Dyspnea on exertion noted -Repeat chest x-ray on 10/11/21 shows improving aeration -Overall hypoxia has improved but not resolved -Discharge home on home O2   Constipation -Continue to maintain adequate hydration Improved with laxatives   AKI (acute kidney injury) ([place]) -Continue to avoid nephrotoxic agents -Creatinine was 1.53 on admission, normalized at this time   Type 2 diabetes mellitus with other specified complication (HCC) -Recent A1c 6.3 -Resume home regimen   GERD (gastroesophageal reflux disease) -Continue protonix    HTN (hypertension) -Blood pressure stable and fairly well controlled. -Discharge on amlodipine and Avapro     HLD (hyperlipidemia) -Continue statins -Continue heart healthy diet.   Discharge Condition: stable  Follow UP   Follow-up Information     [facility], [facility] Follow up.   Specialty: Home Health Services Why: PT will call to schedule your first home visit. Contact information: [address] [place] [address] [phone]                  Diet and Activity recommendation:  As advised  Discharge Instructions  * Discharge Instructions     Call MD for:  persistant dizziness or light-headedness   Complete by: As directed    Call MD for:  persistant nausea and vomiting   Complete by: As directed    Call MD for:  temperature >100.4   Complete by: As directed    Diet - low sodium heart healthy   Complete by: As directed    Diet Carb Modified   Complete by: As directed    Discharge instructions   Complete by: As directed    1) take Augmentin antibiotic as prescribed for Streptococcus pneumonia infection your blood and lungs 2) repeat  CBC and BMP blood test with primary care physician within a week advised 3)you need oxygen at home at 4 L via nasal cannula continuously while awake and while asleep--- smoking or having open fires around oxygen can cause fire, significant injury and death 4) avoid excessively strenuous activity for the next week or so--until seen for recheck by your primary care physician 5) repeat chest x-ray in 1 to 2 weeks with a primary care physician advised   Increase activity slowly   Complete by: As directed          Discharge Medications     Allergies as of 10/11/2021  Reactions   Sulfonamide Derivatives    REACTION: rash        Medication List     TAKE these medications    acetaminophen 500 MG tablet Commonly known as: TYLENOL Take 1,000 mg by mouth every 6 (six) hours as needed.   albuterol 108 (90 Base) MCG/ACT inhaler Commonly known as: VENTOLIN HFA Inhale 2 puffs into the lungs every 6 (six) hours as needed for wheezing or shortness of breath.   amLODipine 5 MG tablet Commonly known as: NORVASC Take 1 tablet (5 mg total) by mouth daily.   amoxicillin-clavulanate 875-125 MG tablet Commonly known as: Augmentin Take 1 tablet by mouth 2 (two) times daily for 7 days. Start taking on: October 12, 2021   aspirin 81 MG tablet Take 1 tablet (81 mg total) by mouth daily with breakfast. What changed: when to take this   atorvastatin 40 MG tablet Commonly known as: LIPITOR Take 1 tablet (40 mg total) by mouth daily.   carbidopa-levodopa 25-100 MG tablet Commonly known as: SINEMET IR TAKE 1 & 1/2 (ONE & ONE-HALF) TABLETS BY MOUTH THREE TIMES DAILY What changed: See the new instructions.   guaiFENesin 600 MG 12 hr tablet Commonly known as: Mucinex Take 1 tablet (600 mg total) by mouth 2 (two) times daily for 10 days.   irbesartan 75 MG tablet Commonly known as: AVAPRO Take 1 tablet (75 mg total) by mouth daily.   pantoprazole 40 MG tablet Commonly known as:  PROTONIX Take 1 tablet (40 mg total) by mouth every other day.   Vitamin D3 50 MCG (2000 UT) Tabs Take 2,000 Units by mouth daily.               Durable Medical Equipment  (From admission, onward)           Start     Ordered   10/11/21 0905  For home use only DME oxygen  Once       Comments: SATURATION QUALIFICATIONS: (This note is used to comply with regulatory documentation for home oxygen)   Patient Saturations on Room Air at Rest = 85 %   Patient Saturations on Room Air while Ambulating = 83 %   Patient Saturations on 4 Liters of oxygen while Ambulating = 94 %       Patient needs continuous O2 at 4 L/min continuously via nasal cannula with humidifier, with gaseous portability and conserving device    Dx-COPD--  Question Answer Comment  Length of Need Lifetime   Mode or (Route) Nasal cannula   Liters per Minute 4   Frequency Continuous (stationary and portable oxygen unit needed)   Oxygen conserving device Yes   Oxygen delivery system Gas      10/11/21 0905            Major procedures and Radiology Reports - PLEASE review detailed and final reports for all details, in brief -  DG Chest 2 View  Result Date: 10/11/2021 CLINICAL DATA:  Dyspnea and respiratory abnormalities. Additional history provided: Left-sided chest pain, hypertension, diabetes, history of prostate cancer, ex smoker, cough. COVID -3 10/22/2021. EXAM: CHEST - 2 VIEW COMPARISON:  Prior chest radiographs 10/08/2021 and earlier. FINDINGS: Mild cardiomegaly, unchanged. Aortic atherosclerosis. Persistent multifocal pneumonia within the left lung. However, aeration within the left mid lung has improved as compared to the prior chest radiograph of 10/08/2021. Persistent bilateral pleural effusions (moderate volume left, trace right). No evidence of pneumothorax. IMPRESSION: Persistent multifocal pneumonia within the left lung. However, aeration  within the left mid lung has improved as compared to the  prior chest radiograph of 10/08/2021. Radiographic follow-up to complete resolution is recommended. Unchanged bilateral pleural effusions (moderate volume left, trace right). Mild cardiomegaly, unchanged. Aortic Atherosclerosis (ICD10-I70.0). Electronically Signed   By: Kellie Simmering D.O.   On: 10/11/2021 08:26   DG Chest 2 View  Result Date: 10/05/2021 CLINICAL DATA:  Shortness of breath, chest pain EXAM: CHEST - 2 VIEW COMPARISON:  09/21/2012 FINDINGS: Normal heart size, mediastinal contours, and pulmonary vascularity. Atherosclerotic calcification aorta. Extensive airspace infiltrate LEFT upper lobe, new since previous exam, consistent with pneumonia. Minimal atelectasis at lung bases. Remaining lungs clear. Trace LEFT pleural effusion. No pneumothorax or acute osseous findings. IMPRESSION: LEFT upper lobe pneumonia with trace LEFT pleural effusion. Aortic Atherosclerosis (ICD10-I70.0). Electronically Signed   By: Lavonia Dana M.D.   On: 10/05/2021 11:54   DG CHEST PORT 1 VIEW  Result Date: 10/08/2021 CLINICAL DATA:  81 year old male with history of sepsis from pneumonia. EXAM: PORTABLE CHEST 1 VIEW COMPARISON:  Chest x-ray 10/06/2021. FINDINGS: Extensive airspace consolidation throughout the left mid to lower lung. Moderate left pleural effusion. Right lung is clear. Trace right pleural effusion. No pneumothorax. No evidence of pulmonary edema. Heart size is mildly enlarged. Upper mediastinal contours are within normal limits allowing for mild patient rotation to the left. Atherosclerotic calcifications are noted in the thoracic aorta. IMPRESSION: 1. Persistent pneumonia most evident throughout the left mid to lower lung with moderate left pleural effusion. 2. Trace right pleural effusion. 3. Mild cardiomegaly. 4. Aortic atherosclerosis. Electronically Signed   By: Vinnie Langton M.D.   On: 10/08/2021 05:50   DG CHEST PORT 1 VIEW  Result Date: 10/06/2021 CLINICAL DATA:  81 year old male with history of  shortness of breath. Diabetes and hypertension. EXAM: PORTABLE CHEST 1 VIEW COMPARISON:  Chest x-ray 10/05/2021. FINDINGS: Widespread areas of airspace consolidation throughout the left lung again noted, most confluent in the left lung base. Moderate left pleural effusion. Right lung is clear. No right pleural effusion. No pneumothorax. No evidence of pulmonary edema. Heart size is borderline enlarged. The patient is rotated to the left on today's exam, resulting in distortion of the mediastinal contours and reduced diagnostic sensitivity and specificity for mediastinal pathology. Atherosclerotic calcifications are noted in the thoracic aorta. IMPRESSION: 1. Worsening airspace consolidation throughout the left lung, presumably from progressive multilobar pneumonia, with enlarging moderate left parapneumonic pleural effusion. 2. Aortic atherosclerosis. Electronically Signed   By: Vinnie Langton M.D.   On: 10/06/2021 08:49   ECHOCARDIOGRAM COMPLETE  Result Date: 10/10/2021    ECHOCARDIOGRAM REPORT   Patient Name:   Paul [name] Date of Exam: 10/10/2021 Medical Rec #:  [id number]       Height:       67.0 in Accession #:    [id number]      Weight:       202.2 lb Date of Birth:  03-02-1941      BSA:          2.032 m Patient Age:    72 years        BP:           132/76 mmHg Patient Gender: M               HR:           85 bpm. Exam Location:  [facility] Procedure: 2D Echo, Cardiac Doppler and Color Doppler Indications:    Bacteremia  History:  Patient has no prior history of Echocardiogram examinations.                 Risk Factors:Hypertension, Diabetes and Dyslipidemia.                 Parkinson's Disease.  Sonographer:    Wenda Low Referring Phys: [place]  1. Left ventricular ejection fraction, by estimation, is 60 to 65%. The left ventricle has normal function. The left ventricle has no regional wall motion abnormalities. There is mild left ventricular hypertrophy. Left  ventricular diastolic parameters were normal.  2. Right ventricular systolic function is normal. The right ventricular size is normal. There is normal pulmonary artery systolic pressure. The estimated right ventricular systolic pressure is 46.6 mmHg.  3. The mitral valve is grossly normal. Trivial mitral valve regurgitation.  4. The aortic valve is tricuspid. Aortic valve regurgitation is not visualized. No aortic stenosis is present.  5. The inferior vena cava is normal in size with greater than 50% respiratory variability, suggesting right atrial pressure of 3 mmHg. Comparison(s): No prior Echocardiogram. FINDINGS  Left Ventricle: Left ventricular ejection fraction, by estimation, is 60 to 65%. The left ventricle has normal function. The left ventricle has no regional wall motion abnormalities. The left ventricular internal cavity size was normal in size. There is  mild left ventricular hypertrophy. Left ventricular diastolic parameters were normal. Right Ventricle: The right ventricular size is normal. No increase in right ventricular wall thickness. Right ventricular systolic function is normal. There is normal pulmonary artery systolic pressure. The tricuspid regurgitant velocity is 1.61 m/s, and  with an assumed right atrial pressure of 3 mmHg, the estimated right ventricular systolic pressure is 59.9 mmHg. Left Atrium: Left atrial size was normal in size. Right Atrium: Right atrial size was normal in size. Pericardium: There is no evidence of pericardial effusion. Mitral Valve: The mitral valve is grossly normal. Trivial mitral valve regurgitation. MV peak gradient, 4.8 mmHg. The mean mitral valve gradient is 2.0 mmHg. Tricuspid Valve: The tricuspid valve is grossly normal. Tricuspid valve regurgitation is trivial. Aortic Valve: The aortic valve is tricuspid. There is mild to moderate aortic valve annular calcification. Aortic valve regurgitation is not visualized. No aortic stenosis is present. Aortic valve  mean gradient measures 3.0 mmHg. Aortic valve peak gradient measures 6.9 mmHg. Aortic valve area, by VTI measures 3.03 cm. Pulmonic Valve: The pulmonic valve was grossly normal. Pulmonic valve regurgitation is trivial. Aorta: The aortic root is normal in size and structure. Venous: The inferior vena cava is normal in size with greater than 50% respiratory variability, suggesting right atrial pressure of 3 mmHg. IAS/Shunts: No atrial level shunt detected by color flow Doppler.  LEFT VENTRICLE PLAX 2D LVIDd:         4.80 cm     Diastology LVIDs:         3.20 cm     LV e' medial:    9.36 cm/s LV PW:         1.10 cm     LV E/e' medial:  9.5 LV IVS:        1.10 cm     LV e' lateral:   12.00 cm/s LVOT diam:     2.00 cm     LV E/e' lateral: 7.4 LV SV:         84 LV SV Index:   41 LVOT Area:     3.14 cm  LV Volumes (MOD) LV vol d, MOD A2C:  53.4 ml LV vol d, MOD A4C: 91.4 ml LV vol s, MOD A2C: 19.5 ml LV vol s, MOD A4C: 36.4 ml LV SV MOD A2C:     33.9 ml LV SV MOD A4C:     91.4 ml LV SV MOD BP:      50.5 ml RIGHT VENTRICLE RV Basal diam:  3.50 cm RV Mid diam:    3.10 cm RV S prime:     9.46 cm/s TAPSE (M-mode): 2.1 cm LEFT ATRIUM             Index        RIGHT ATRIUM           Index LA diam:        3.90 cm 1.92 cm/m   RA Area:     18.70 cm LA Vol (A2C):   42.5 ml 20.92 ml/m  RA Volume:   51.60 ml  25.40 ml/m LA Vol (A4C):   53.6 ml 26.38 ml/m LA Biplane Vol: 48.8 ml 24.02 ml/m  AORTIC VALVE                    PULMONIC VALVE AV Area (Vmax):    2.97 cm     PV Vmax:       0.81 m/s AV Area (Vmean):   2.74 cm     PV Peak grad:  2.6 mmHg AV Area (VTI):     3.03 cm AV Vmax:           131.00 cm/s AV Vmean:          80.000 cm/s AV VTI:            0.278 m AV Peak Grad:      6.9 mmHg AV Mean Grad:      3.0 mmHg LVOT Vmax:         124.00 cm/s LVOT Vmean:        69.900 cm/s LVOT VTI:          0.268 m LVOT/AV VTI ratio: 0.96  AORTA Ao Root diam: 3.40 cm MITRAL VALVE               TRICUSPID VALVE MV Area (PHT): 4.89 cm    TR  Peak grad:   10.4 mmHg MV Area VTI:   2.91 cm    TR Vmax:        161.00 cm/s MV Peak grad:  4.8 mmHg MV Mean grad:  2.0 mmHg    SHUNTS MV Vmax:       1.10 m/s    Systemic VTI:  0.27 m MV Vmean:      58.7 cm/s   Systemic Diam: 2.00 cm MV Decel Time: 155 msec MV E velocity: 89.10 cm/s MV A velocity: 80.10 cm/s MV E/A ratio:  1.11 Rozann Lesches MD Electronically signed by Rozann Lesches MD Signature Date/Time: 10/10/2021/3:22:45 PM    Final     Micro Results  Recent Results (from the past 240 hour(s))  Culture, blood (Routine x 2)     Status: Abnormal   Collection Time: 10/05/21  1:11 PM   Specimen: Left Antecubital; Blood  Result Value Ref Range Status   Specimen Description   Final    LEFT ANTECUBITAL BOTTLES DRAWN AEROBIC AND ANAEROBIC Performed at [facility], [address] [place], [place] [address]    Special Requests   Final    Blood Culture results may not be optimal due to an excessive volume of blood received in culture  bottles Performed at [facility], [address] [place], [place] [address]    Culture  Setup Time   Final    GRAM POSITIVE COCCI AEROBIC BOTTLE ONLY Gram Stain Report Called to,Read Back By and Verified With: LONG J @ 0951 ON [id number] BY HENDERSON L CRITICAL RESULT CALLED TO, READ BACK BY AND VERIFIED WITH: L POOLE PHARMD _0  10/06/21 EB Performed at [facility], [place] [address], [place], [place] [address]    Culture STREPTOCOCCUS PNEUMONIAE (A)  Final   Report Status 10/08/2021 FINAL  Final   Organism ID, Bacteria STREPTOCOCCUS PNEUMONIAE  Final      Susceptibility   Streptococcus pneumoniae - MIC*    ERYTHROMYCIN <=0.12 SENSITIVE Sensitive     LEVOFLOXACIN 1 SENSITIVE Sensitive     VANCOMYCIN 0.5 SENSITIVE Sensitive     PENICILLIN (meningitis) <=0.06 SENSITIVE Sensitive     PENO - penicillin <=0.06      PENICILLIN (non-meningitis) <=0.06 SENSITIVE Sensitive     PENICILLIN (oral) <=0.06 SENSITIVE Sensitive     CEFTRIAXONE (non-meningitis) 0.25  SENSITIVE Sensitive     CEFTRIAXONE (meningitis) 0.25 SENSITIVE Sensitive     * STREPTOCOCCUS PNEUMONIAE  Blood Culture ID Panel (Reflexed)     Status: Abnormal   Collection Time: 10/05/21  1:11 PM  Result Value Ref Range Status   Enterococcus faecalis NOT DETECTED NOT DETECTED Final   Enterococcus Faecium NOT DETECTED NOT DETECTED Final   Listeria monocytogenes NOT DETECTED NOT DETECTED Final   Staphylococcus species NOT DETECTED NOT DETECTED Final   Staphylococcus aureus (BCID) NOT DETECTED NOT DETECTED Final   Staphylococcus epidermidis NOT DETECTED NOT DETECTED Final   Staphylococcus lugdunensis NOT DETECTED NOT DETECTED Final   Streptococcus species DETECTED (A) NOT DETECTED Final    Comment: CRITICAL RESULT CALLED TO, READ BACK BY AND VERIFIED WITH: L POOLE PHARMD _1  10/06/21 EB    Streptococcus agalactiae NOT DETECTED NOT DETECTED Final   Streptococcus pneumoniae DETECTED (A) NOT DETECTED Final    Comment: CRITICAL RESULT CALLED TO, READ BACK BY AND VERIFIED WITH: L POOLE PHARMD _2  10/06/21 EB    Streptococcus pyogenes NOT DETECTED NOT DETECTED Final   A.calcoaceticus-baumannii NOT DETECTED NOT DETECTED Final   Bacteroides fragilis NOT DETECTED NOT DETECTED Final   Enterobacterales NOT DETECTED NOT DETECTED Final   Enterobacter cloacae complex NOT DETECTED NOT DETECTED Final   Escherichia coli NOT DETECTED NOT DETECTED Final   Klebsiella aerogenes NOT DETECTED NOT DETECTED Final   Klebsiella oxytoca NOT DETECTED NOT DETECTED Final   Klebsiella pneumoniae NOT DETECTED NOT DETECTED Final   Proteus species NOT DETECTED NOT DETECTED Final   Salmonella species NOT DETECTED NOT DETECTED Final   Serratia marcescens NOT DETECTED NOT DETECTED Final   Haemophilus influenzae NOT DETECTED NOT DETECTED Final   Neisseria meningitidis NOT DETECTED NOT DETECTED Final   Pseudomonas aeruginosa NOT DETECTED NOT DETECTED Final   Stenotrophomonas maltophilia NOT DETECTED NOT DETECTED Final    Candida albicans NOT DETECTED NOT DETECTED Final   Candida auris NOT DETECTED NOT DETECTED Final   Candida glabrata NOT DETECTED NOT DETECTED Final   Candida krusei NOT DETECTED NOT DETECTED Final   Candida parapsilosis NOT DETECTED NOT DETECTED Final   Candida tropicalis NOT DETECTED NOT DETECTED Final   Cryptococcus neoformans/gattii NOT DETECTED NOT DETECTED Final    Comment: Performed at [facility], [place] [address], [place], [place] [address]  Resp Panel by RT-PCR (Flu A&B, Covid) Nasopharyngeal Swab     Status:  None   Collection Time: 10/05/21  1:13 PM   Specimen: Nasopharyngeal Swab; Nasopharyngeal(NP) swabs in vial transport medium  Result Value Ref Range Status   SARS Coronavirus 2 by RT PCR NEGATIVE NEGATIVE Final    Comment: (NOTE) SARS-CoV-2 target nucleic acids are NOT DETECTED.  The SARS-CoV-2 RNA is generally detectable in upper respiratory specimens during the acute phase of infection. The lowest concentration of SARS-CoV-2 viral copies this assay can detect is 138 copies/mL. A negative result does not preclude SARS-Cov-2 infection and should not be used as the sole basis for treatment or other patient management decisions. A negative result may occur with  improper specimen collection/handling, submission of specimen other than nasopharyngeal swab, presence of viral mutation(s) within the areas targeted by this assay, and inadequate number of viral copies(<138 copies/mL). A negative result must be combined with clinical observations, patient history, and epidemiological information. The expected result is Negative.  Fact Sheet for Patients:  EntrepreneurPulse.com.au  Fact Sheet for Healthcare Providers:  IncredibleEmployment.be  This test is no t yet approved or cleared by the [place] FDA and  has been authorized for detection and/or diagnosis of SARS-CoV-2 by FDA under an Emergency Use Authorization (EUA). This  EUA will remain  in effect (meaning this test can be used) for the duration of the COVID-19 declaration under Section 564(b)(1) of the Act, 21 U.S.C.section 360bbb-3(b)(1), unless the authorization is terminated  or revoked sooner.       Influenza A by PCR NEGATIVE NEGATIVE Final   Influenza B by PCR NEGATIVE NEGATIVE Final    Comment: (NOTE) The Xpert Xpress SARS-CoV-2/FLU/RSV plus assay is intended as an aid in the diagnosis of influenza from Nasopharyngeal swab specimens and should not be used as a sole basis for treatment. Nasal washings and aspirates are unacceptable for Xpert Xpress SARS-CoV-2/FLU/RSV testing.  Fact Sheet for Patients: EntrepreneurPulse.com.au  Fact Sheet for Healthcare Providers: IncredibleEmployment.be  This test is not yet approved or cleared by the [place] FDA and has been authorized for detection and/or diagnosis of SARS-CoV-2 by FDA under an Emergency Use Authorization (EUA). This EUA will remain in effect (meaning this test can be used) for the duration of the COVID-19 declaration under Section 564(b)(1) of the Act, 21 U.S.C. section 360bbb-3(b)(1), unless the authorization is terminated or revoked.  Performed at [facility], [address], [place], [place] [address]   Culture, blood (Routine x 2)     Status: None   Collection Time: 10/05/21  1:34 PM   Specimen: BLOOD LEFT HAND  Result Value Ref Range Status   Specimen Description   Final    BLOOD LEFT HAND BOTTLES DRAWN AEROBIC AND ANAEROBIC   Special Requests   Final    Blood Culture results may not be optimal due to an inadequate volume of blood received in culture bottles   Culture   Final    NO GROWTH 5 DAYS Performed at [facility], [address] [place], [place] [address]    Report Status 10/10/2021 FINAL  Final  MRSA Next Gen by PCR, Nasal     Status: None   Collection Time: 10/06/21  6:20 PM   Specimen: Nasal Mucosa; Nasal Swab  Result  Value Ref Range Status   MRSA by PCR Next Gen NOT DETECTED NOT DETECTED Final    Comment: (NOTE) The GeneXpert MRSA Assay (FDA approved for NASAL specimens only), is one component of a comprehensive MRSA colonization surveillance program. It is not intended to diagnose MRSA infection  nor to guide or monitor treatment for MRSA infections. Test performance is not FDA approved in patients less than 58 years old. Performed at [facility], [address] [place], [place] [address]     Today   Subjective    Kerney Hopfensperger today has no new concerns  No fever  Or chills   No Nausea, Vomiting or Diarrhea -- Wife at bedside shortness of breath at rest has resolved -Cough and dyspnea on exertion has improved but not resolved -           Patient has been seen and examined prior to discharge   Objective   Blood pressure 136/74, pulse 80, temperature 98.7 F (37.1 C), resp. rate 18, height 5' 7" (1.702 m), weight 91.7 kg, SpO2 94 %.   Intake/Output Summary (Last 24 hours) at 10/11/2021 1217 Last data filed at 10/11/2021 1146 Gross per 24 hour  Intake 720 ml  Output 2500 ml  Net -1780 ml    Exam Gen:- Awake Alert, no conversational dyspnea  HEENT:- [place].AT, No sclera icterus Nose- [place] 4L/min Neck-Supple Neck,No JVD,.  Lungs-improving air movement, no wheezing  CV- S1, S2 normal, RRR Abd-  +ve B.Sounds, Abd Soft, No tenderness,    Extremity/Skin:- No  edema,   good pedal pulses  Psych-affect is appropriate, oriented x3 Neuro-generalized weakness, no new focal deficits, no tremors   Data Review   CBC w Diff:  Lab Results  Component Value Date   WBC 9.0 10/11/2021   HGB 11.7 (L) 10/11/2021   HGB 14.3 10/04/2021   HGB 14.7 10/13/2015   HCT 36.6 (L) 10/11/2021   HCT 43.1 10/04/2021   HCT 44.6 10/13/2015   PLT 359 10/11/2021   PLT 343 10/04/2021   LYMPHOPCT 2 10/05/2021   LYMPHOPCT 41.5 10/13/2015   BANDSPCT 19 10/05/2021   MONOPCT 9 10/05/2021   MONOPCT 11.0 10/13/2015    EOSPCT 0 10/05/2021   EOSPCT 3.2 10/13/2015   BASOPCT 0 10/05/2021   BASOPCT 0.3 10/13/2015    CMP:  Lab Results  Component Value Date   NA 136 10/11/2021   NA 141 10/04/2021   NA 141 04/06/2013   K 4.2 10/11/2021   K 4.1 04/06/2013   CL 100 10/11/2021   CL 106 10/16/2012   CO2 28 10/11/2021   CO2 24 04/06/2013   BUN 15 10/11/2021   BUN 14 10/04/2021   BUN 16.7 04/06/2013   CREATININE 0.69 10/11/2021   CREATININE 1.0 04/06/2013   PROT 6.5 10/06/2021   PROT 7.1 10/04/2021   PROT 7.3 04/06/2013   ALBUMIN 3.1 (L) 10/06/2021   ALBUMIN 4.5 10/04/2021   ALBUMIN 3.7 04/06/2013   BILITOT 0.9 10/06/2021   BILITOT 1.0 10/04/2021   BILITOT 0.34 04/06/2013   ALKPHOS 61 10/06/2021   ALKPHOS 101 04/06/2013   AST 24 10/06/2021   AST 24 04/06/2013   ALT 13 10/06/2021   ALT 23 04/06/2013  .  Total Discharge time is about 33 minutes  Roxan Hockey M.D on 10/11/2021 at 12:17 PM  Go to [email or web address] -  for contact info  [facility] - Office  [phone]

## 2021-10-11 NOTE — Progress Notes (Signed)
SATURATION QUALIFICATIONS: (This note is used to comply with regulatory documentation for home oxygen) ? ?Patient Saturations on Room Air at Rest = 83% ? ?Patient Saturations on 4 Liters of oxygen while Ambulating = 90% ? ?Patient Saturations on 5 Liters of oxygen while ambulating= 92% ? ?Please briefly explain why patient needs home oxygen: ?

## 2021-10-11 NOTE — TOC Transition Note (Addendum)
Transition of Care (TOC) - CM/SW Discharge Note ? ? ?Patient Details  ?Name: Paul [name] ?MRN: [id number] ?Date of Birth: [birth year] ? ?Transition of Care (TOC) CM/SW Contact:  ?Iona Beard, LCSWA ?Phone Number: ?10/11/2021, 10:28 AM ? ? ?Clinical Narrative:    ?CSW updated that pt will need home O2 set up prior to D/C today. CSW spoke with pt in room who states he does not have a preference in DME companies. CSW reached out to [place] with [employer] who will work on the referral. Caryl Pina accepted pts referral and stated it would be delivered to the hospital for pt. CSW updated Tommi Rumps with [facility] that pt will be discharging today. TOC signing off.  ? ?Final next level of care: [place] ?Barriers to Discharge: Barriers Resolved ? ? ?Patient Goals and CMS Choice ?Patient states their goals for this hospitalization and ongoing recovery are:: Home with HH ?CMS Medicare.gov Compare Post Acute Care list provided to:: Patient Represenative (must comment) ?Choice offered to / list presented to : Spouse, Patient ? ?Discharge Placement ?  ?           ?  ?  ?  ?  ? ?Discharge Plan and Services ?  ?  ?           ?DME Arranged: Oxygen ?DME Agency: [employer] ?Date DME Agency Contacted: 10/11/21 ?  ?Representative spoke with at DME Agency: [place] ?HH Arranged: PT ?[place] Agency: [place] ?Date HH Agency Contacted: 10/09/21 ?Time [place]: 1700 ?Representative spoke with at [place]: Georgina Snell ? ?Social Determinants of Health (SDOH) Interventions ?  ? ? ?Readmission Risk Interventions ?Readmission Risk Prevention Plan 10/09/2021  ?Transportation Screening Complete  ?PCP or Specialist Appt within 5-7 Days Not Complete  ?Home Care Screening Complete  ?Medication Review (RN CM) Complete  ?Some recent data might be hidden  ? ? ? ? ? ?

## 2021-10-11 NOTE — Progress Notes (Addendum)
?  ?  SATURATION QUALIFICATIONS: (This note is used to comply with regulatory documentation for home oxygen) ?  ?Patient Saturations on Room Air at Rest = 85 % ?  ?Patient Saturations on Room Air while Ambulating = 83 % ?  ?Patient Saturations on 4 Liters of oxygen while Ambulating = 94 %  ?  ?Patient needs continuous O2 at 4 L/min continuously via nasal cannula with humidifier, with gaseous portability and conserving device   ? ?Dx-COPD-- ? ?Roxan Hockey, MD ? ?

## 2021-10-11 NOTE — Care Management Important Message (Signed)
Important Message ? ?Patient Details  ?Name: Paul [name] ?MRN: [id number] ?Date of Birth: 1941-05-08 ? ? ?Medicare Important Message Given:  Yes ? ? ? ? ?Tommy Medal ?10/11/2021, 12:53 PM ?

## 2021-10-11 NOTE — Discharge Instructions (Signed)
1) take Augmentin antibiotic as prescribed for Streptococcus pneumonia infection your blood and lungs ?2) repeat CBC and BMP blood test with primary care physician within a week advised ?3)you need oxygen at home at 4 L via nasal cannula continuously while awake and while asleep--- smoking or having open fires around oxygen can cause fire, significant injury and death ?4) avoid excessively strenuous activity for the next week or so--until seen for recheck by your primary care physician ?5) repeat chest x-ray in 1 to 2 weeks with a primary care physician advised ?

## 2021-10-12 ENCOUNTER — Telehealth: Payer: Self-pay | Admitting: Family Medicine

## 2021-10-12 ENCOUNTER — Telehealth: Payer: Self-pay

## 2021-10-12 NOTE — Telephone Encounter (Signed)
Transition Care Management Unsuccessful Follow-up Telephone Call ? ?Date of discharge and from where:  10/11/2021 - Paul [name] - Sepsis due to pneumonia ? ?Attempts:  1st Attempt ? ?Reason for unsuccessful TCM follow-up call:  Left voice message ? ?*He has already called and made hosp f/u for 10/17/21 @ 10:55 - I just need to ask him the TCM call questions ? ?  ?

## 2021-10-12 NOTE — Telephone Encounter (Signed)
Called and spoke with wife - appt made for 3/15 with Dettinger  ?

## 2021-10-13 DIAGNOSIS — J9601 Acute respiratory failure with hypoxia: Secondary | ICD-10-CM | POA: Diagnosis not present

## 2021-10-13 DIAGNOSIS — I10 Essential (primary) hypertension: Secondary | ICD-10-CM | POA: Diagnosis not present

## 2021-10-13 DIAGNOSIS — J181 Lobar pneumonia, unspecified organism: Secondary | ICD-10-CM | POA: Diagnosis not present

## 2021-10-13 DIAGNOSIS — D509 Iron deficiency anemia, unspecified: Secondary | ICD-10-CM | POA: Diagnosis not present

## 2021-10-13 DIAGNOSIS — Z8546 Personal history of malignant neoplasm of prostate: Secondary | ICD-10-CM | POA: Diagnosis not present

## 2021-10-13 DIAGNOSIS — A419 Sepsis, unspecified organism: Secondary | ICD-10-CM | POA: Diagnosis not present

## 2021-10-13 DIAGNOSIS — K219 Gastro-esophageal reflux disease without esophagitis: Secondary | ICD-10-CM | POA: Diagnosis not present

## 2021-10-13 DIAGNOSIS — G20 Parkinson's disease: Secondary | ICD-10-CM | POA: Diagnosis not present

## 2021-10-13 DIAGNOSIS — Z9181 History of falling: Secondary | ICD-10-CM | POA: Diagnosis not present

## 2021-10-13 DIAGNOSIS — Z87891 Personal history of nicotine dependence: Secondary | ICD-10-CM | POA: Diagnosis not present

## 2021-10-13 DIAGNOSIS — N179 Acute kidney failure, unspecified: Secondary | ICD-10-CM | POA: Diagnosis not present

## 2021-10-13 DIAGNOSIS — Z7982 Long term (current) use of aspirin: Secondary | ICD-10-CM | POA: Diagnosis not present

## 2021-10-13 DIAGNOSIS — E785 Hyperlipidemia, unspecified: Secondary | ICD-10-CM | POA: Diagnosis not present

## 2021-10-13 DIAGNOSIS — E1169 Type 2 diabetes mellitus with other specified complication: Secondary | ICD-10-CM | POA: Diagnosis not present

## 2021-10-15 NOTE — Telephone Encounter (Signed)
Transition Care Management Follow-up Telephone Call ?Date of discharge and from where: 10/11/21 - [name] - Sepsis due to Pneumonia ?How have you been since you were released from the hospital? Better, just not able to wean from oxygen - still on 4lpm continuously ?Any questions or concerns? No ? ?Items Reviewed: ?Did the pt receive and understand the discharge instructions provided? Yes  ?Medications obtained and verified? Yes  ?Other? No  ?Any new allergies since your discharge? No  ?Dietary orders reviewed? Yes ?Do you have support at home? Yes  ? ?Home Care and Equipment/Supplies: ?Were home health services ordered? yes ?If so, what is the name of the agency? [facility]  ?Has the agency set up a time to come to the patient's home? yes ?Were any new equipment or medical supplies ordered?  Yes: Oxygen ?What is the name of the medical supply agency? [facility] ?Were you able to get the supplies/equipment? yes ?Do you have any questions related to the use of the equipment or supplies? No ? ?Functional Questionnaire: (I = Independent and D = Dependent) ?ADLs: I ? ?Bathing/Dressing- I ? ?Meal Prep- I ? ?Eating- I ? ?Maintaining continence- I ? ?Transferring/Ambulation- I ? ?Managing Meds- D ? ?Follow up appointments reviewed: ? ?PCP Hospital f/u appt confirmed? Yes  Scheduled to see Dettinger on 3/15/ @ 10:55. ?[facility] f/u appt confirmed? No  Scheduled to see Dr Carles Collet, Neurologist on 10/30/21 @ 10:45, but this was already scheduled prior to going in hosp ?Are transportation arrangements needed? No  ?If their condition worsens, is the pt aware to call PCP or go to the Emergency Dept.? Yes ?Was the patient provided with contact information for the PCP's office or ED? Yes ?Was to pt encouraged to call back with questions or concerns? Yes  ?

## 2021-10-17 ENCOUNTER — Ambulatory Visit (INDEPENDENT_AMBULATORY_CARE_PROVIDER_SITE_OTHER): Payer: Medicare Other | Admitting: Family Medicine

## 2021-10-17 ENCOUNTER — Ambulatory Visit (INDEPENDENT_AMBULATORY_CARE_PROVIDER_SITE_OTHER): Payer: Medicare Other

## 2021-10-17 ENCOUNTER — Encounter: Payer: Self-pay | Admitting: Family Medicine

## 2021-10-17 VITALS — BP 118/64 | HR 61 | Ht 67.0 in | Wt 188.0 lb

## 2021-10-17 DIAGNOSIS — N179 Acute kidney failure, unspecified: Secondary | ICD-10-CM

## 2021-10-17 DIAGNOSIS — J189 Pneumonia, unspecified organism: Secondary | ICD-10-CM

## 2021-10-17 DIAGNOSIS — J9601 Acute respiratory failure with hypoxia: Secondary | ICD-10-CM | POA: Diagnosis not present

## 2021-10-17 DIAGNOSIS — A419 Sepsis, unspecified organism: Secondary | ICD-10-CM

## 2021-10-17 DIAGNOSIS — J90 Pleural effusion, not elsewhere classified: Secondary | ICD-10-CM | POA: Diagnosis not present

## 2021-10-17 NOTE — Progress Notes (Signed)
? ?BP 118/64   Pulse 61   Ht 5' 7"  (1.702 m)   Wt 188 lb (85.3 kg)   SpO2 97%   BMI 29.44 kg/m?   ? ?Subjective:  ? ?Patient ID: Paul [name], male    DOB: 11-09-1940, 81 y.o.   MRN: [id number] ? ?HPI: ?[name] is a 81 y.o. male presenting on 10/17/2021 for TCM (Sepsis due to pneumonia- d/c frm [facility] 3/10- feeling better) ? ? ?HPI ?Transition Care Management Follow-up Telephone Call ?Date of discharge and from where: 10/11/21 - [name] - Sepsis due to Pneumonia ?How have you been since you were released from the hospital? Better, just not able to wean from oxygen - still on 4lpm continuously ?Any questions or concerns? No ?With patient was contacted by [facility] LPN on 4/69/6295 ? ?Transitional care office visit ?Patient is coming in today for transition of care office visit patient was admitted on 10/05/2021 and discharged on 10/11/2021 for pneumonia that caused sepsis.  Since coming home he has been placed on 4 L of oxygen and today in the office he is saturating at 97% but does say at home he has been getting down into the 92 or 93% range.  He has been giving oral Augmentin to go home with.  During the hospitalization he also had acute kidney injury as well.  He does feel like his breathing is improved a lot.  He is still on the 4 L and dips down to 92% at times.  They are trying to wean him down a little bit.  He has not used his albuterol inhaler much and denies any shortness of breath or wheezing.  He does feel still little bit weak but otherwise feeling pretty good. ? ?Relevant past medical, surgical, family and social history reviewed and updated as indicated. Interim medical history since our last visit reviewed. ?Allergies and medications reviewed and updated. ? ?Review of Systems  ?Constitutional:  Negative for chills and fever.  ?HENT:  Positive for congestion. Negative for ear discharge, ear pain, postnasal drip, rhinorrhea, sinus pressure, sneezing, sore throat and voice change.   ?Eyes:   Negative for pain, discharge, redness and visual disturbance.  ?Respiratory:  Positive for cough. Negative for shortness of breath and wheezing.   ?Cardiovascular:  Negative for chest pain and leg swelling.  ?Musculoskeletal:  Negative for gait problem.  ?Skin:  Negative for rash.  ?All other systems reviewed and are negative. ? ?Per HPI unless specifically indicated above ? ? ?Allergies as of 10/17/2021   ? ?   Reactions  ? Sulfonamide Derivatives   ? REACTION: rash  ? ?  ? ?  ?Medication List  ?  ? ?  ? Accurate as of October 17, 2021 11:37 AM. If you have any questions, ask your nurse or doctor.  ?  ?  ? ?  ? ?acetaminophen 500 MG tablet ?Commonly known as: TYLENOL ?Take 1,000 mg by mouth every 6 (six) hours as needed. ?  ?albuterol 108 (90 Base) MCG/ACT inhaler ?Commonly known as: VENTOLIN HFA ?Inhale 2 puffs into the lungs every 6 (six) hours as needed for wheezing or shortness of breath. ?  ?amLODipine 5 MG tablet ?Commonly known as: NORVASC ?Take 1 tablet (5 mg total) by mouth daily. ?  ?amoxicillin-clavulanate 875-125 MG tablet ?Commonly known as: Augmentin ?Take 1 tablet by mouth 2 (two) times daily for 7 days. ?  ?aspirin 81 MG tablet ?Take 1 tablet (81 mg total) by mouth daily with  breakfast. ?  ?atorvastatin 40 MG tablet ?Commonly known as: LIPITOR ?Take 1 tablet (40 mg total) by mouth daily. ?  ?carbidopa-levodopa 25-100 MG tablet ?Commonly known as: SINEMET IR ?TAKE 1 & 1/2 (ONE & ONE-HALF) TABLETS BY MOUTH THREE TIMES DAILY ?What changed: See the new instructions. ?  ?guaiFENesin 600 MG 12 hr tablet ?Commonly known as: Mucinex ?Take 1 tablet (600 mg total) by mouth 2 (two) times daily for 10 days. ?  ?irbesartan 75 MG tablet ?Commonly known as: AVAPRO ?Take 1 tablet (75 mg total) by mouth daily. ?  ?OXYGEN ?Inhale 4 L into the lungs continuous as needed. ?  ?pantoprazole 40 MG tablet ?Commonly known as: PROTONIX ?Take 1 tablet (40 mg total) by mouth every other day. ?  ?Vitamin D3 50 MCG (2000 UT)  Tabs ?Take 2,000 Units by mouth daily. ?  ? ?  ? ? ? ?Objective:  ? ?BP 118/64   Pulse 61   Ht 5' 7"  (1.702 m)   Wt 188 lb (85.3 kg)   SpO2 97%   BMI 29.44 kg/m?   ?Wt Readings from Last 3 Encounters:  ?10/17/21 188 lb (85.3 kg)  ?10/08/21 202 lb 2.6 oz (91.7 kg)  ?10/05/21 194 lb (88 kg)  ?  ?Physical Exam ?Vitals and nursing note reviewed.  ?Constitutional:   ?   General: He is not in acute distress. ?   Appearance: He is well-developed. He is not diaphoretic.  ?Eyes:  ?   General: No scleral icterus. ?   Conjunctiva/sclera: Conjunctivae normal.  ?Neck:  ?   Thyroid: No thyromegaly.  ?Cardiovascular:  ?   Rate and Rhythm: Normal rate and regular rhythm.  ?   Heart sounds: Normal heart sounds. No murmur heard. ?Pulmonary:  ?   Effort: Pulmonary effort is normal. No respiratory distress.  ?   Breath sounds: Rhonchi present. No wheezing or rales.  ?Musculoskeletal:     ?   General: Normal range of motion.  ?   Cervical back: Neck supple.  ?Lymphadenopathy:  ?   Cervical: No cervical adenopathy.  ?Skin: ?   General: Skin is warm and dry.  ?   Findings: No rash.  ?Neurological:  ?   Mental Status: He is alert and oriented to person, place, and time.  ?   Coordination: Coordination normal.  ?Psychiatric:     ?   Behavior: Behavior normal.  ? ? ? ? ?Assessment & Plan:  ? ?Problem List Items Addressed This Visit   ? ?  ? Respiratory  ? Sepsis due to pneumonia [facility]) - Primary  ? Relevant Orders  ? CBC with Differential/Platelet  ? CMP14+EGFR  ? DG Chest 2 View  ? Acute respiratory failure with hypoxia ([place])  ? Relevant Orders  ? CBC with Differential/Platelet  ? CMP14+EGFR  ? DG Chest 2 View  ?  ? Genitourinary  ? AKI (acute kidney injury) ([place])  ? Relevant Orders  ? CBC with Differential/Platelet  ? CMP14+EGFR  ? DG Chest 2 View  ?  ?Patient still on 4 L nasal cannula, recommended that they could try and wean down a little bit to 3-1/2 and see how it does as long as his oxygen saturations stay above 92%. ? ?Chest  x-ray: Showed good improvement at left lung base, still shows some congestion but definitely improved, await final read from radiologist ? ?We will do blood work today to recheck kidney function. ?Follow up plan: ?Return if symptoms worsen or fail to improve. ? ?  Counseling provided for all of the vaccine components ?Orders Placed This Encounter  ?Procedures  ? DG Chest 2 View  ? CBC with Differential/Platelet  ? CMP14+EGFR  ? ? ?Caryl Pina, MD ?[place] ?10/17/2021, 11:37 AM ? ? ?  ?

## 2021-10-18 ENCOUNTER — Encounter: Payer: Self-pay | Admitting: Family Medicine

## 2021-10-19 LAB — CMP14+EGFR
ALT: 71 IU/L — ABNORMAL HIGH (ref 0–44)
AST: 59 IU/L — ABNORMAL HIGH (ref 0–40)
Albumin/Globulin Ratio: 1.3 (ref 1.2–2.2)
Albumin: 4.2 g/dL (ref 3.7–4.7)
Alkaline Phosphatase: 178 IU/L — ABNORMAL HIGH (ref 44–121)
BUN: 15 mg/dL (ref 8–27)
Bilirubin Total: 0.4 mg/dL (ref 0.0–1.2)
Calcium: 10.2 mg/dL (ref 8.6–10.2)
Chloride: 101 mmol/L (ref 96–106)
Globulin, Total: 3.3 g/dL (ref 1.5–4.5)
Potassium: 5.4 mmol/L — ABNORMAL HIGH (ref 3.5–5.2)
Sodium: 144 mmol/L (ref 134–144)
Total Protein: 7.5 g/dL (ref 6.0–8.5)

## 2021-10-19 LAB — CBC WITH DIFFERENTIAL/PLATELET
Basophils Absolute: 0.1 10*3/uL (ref 0.0–0.2)
Basos: 1 %
EOS (ABSOLUTE): 0.1 10*3/uL (ref 0.0–0.4)
Eos: 1 %
Hematocrit: 40.8 % (ref 37.5–51.0)
Hemoglobin: 13.4 g/dL (ref 13.0–17.7)
Immature Grans (Abs): 0.1 10*3/uL (ref 0.0–0.1)
Immature Granulocytes: 1 %
Lymphocytes Absolute: 1.9 10*3/uL (ref 0.7–3.1)
Lymphs: 20 %
MCH: 32 pg (ref 26.6–33.0)
MCHC: 32.8 g/dL (ref 31.5–35.7)
MCV: 97 fL (ref 79–97)
Monocytes Absolute: 1 10*3/uL — ABNORMAL HIGH (ref 0.1–0.9)
Monocytes: 10 %
Neutrophils Absolute: 6.6 10*3/uL (ref 1.4–7.0)
Neutrophils: 67 %
Platelets: 669 10*3/uL — ABNORMAL HIGH (ref 150–450)
RBC: 4.19 x10E6/uL (ref 4.14–5.80)
RDW: 12.9 % (ref 11.6–15.4)
WBC: 9.7 10*3/uL (ref 3.4–10.8)

## 2021-10-24 ENCOUNTER — Other Ambulatory Visit: Payer: Self-pay

## 2021-10-24 ENCOUNTER — Ambulatory Visit (INDEPENDENT_AMBULATORY_CARE_PROVIDER_SITE_OTHER): Payer: Medicare Other

## 2021-10-24 DIAGNOSIS — D509 Iron deficiency anemia, unspecified: Secondary | ICD-10-CM

## 2021-10-24 DIAGNOSIS — N179 Acute kidney failure, unspecified: Secondary | ICD-10-CM | POA: Diagnosis not present

## 2021-10-24 DIAGNOSIS — J181 Lobar pneumonia, unspecified organism: Secondary | ICD-10-CM

## 2021-10-24 DIAGNOSIS — J9601 Acute respiratory failure with hypoxia: Secondary | ICD-10-CM

## 2021-10-24 DIAGNOSIS — E785 Hyperlipidemia, unspecified: Secondary | ICD-10-CM

## 2021-10-24 DIAGNOSIS — A419 Sepsis, unspecified organism: Secondary | ICD-10-CM | POA: Diagnosis not present

## 2021-10-24 DIAGNOSIS — I10 Essential (primary) hypertension: Secondary | ICD-10-CM

## 2021-10-24 DIAGNOSIS — E1169 Type 2 diabetes mellitus with other specified complication: Secondary | ICD-10-CM

## 2021-10-24 DIAGNOSIS — G20 Parkinson's disease: Secondary | ICD-10-CM

## 2021-10-24 DIAGNOSIS — Z7982 Long term (current) use of aspirin: Secondary | ICD-10-CM

## 2021-10-24 DIAGNOSIS — K219 Gastro-esophageal reflux disease without esophagitis: Secondary | ICD-10-CM

## 2021-10-25 NOTE — Addendum Note (Signed)
Addended by: Alphonzo Dublin on: 10/25/2021 09:20 AM ? ? Modules accepted: Orders ? ?

## 2021-10-26 NOTE — Progress Notes (Signed)
? ?Virtual Visit Via Video  ? ?The purpose of this virtual visit is to provide medical care while limiting exposure to the novel coronavirus.   ? ?Consent was obtained for video visit:  Yes.   ?Answered questions that patient had about telehealth interaction:  Yes.   ?I discussed the limitations, risks, security and privacy concerns of performing an evaluation and management service by telemedicine. I also discussed with the patient that there may be a patient responsible charge related to this service. The patient expressed understanding and agreed to proceed. ? ?Pt location: Home ?Physician Location: office ?Name of referring provider:  Dettinger, Fransisca Kaufmann, MD ?I connected with Paul [name] at patients initiation/request on 10/30/2021 at 10:45 AM EDT by video enabled telemedicine application and verified that I am speaking with the correct person using two identifiers. ?Pt MRN:  [id number] ?Pt DOB:  22-Apr-1941 ?Video Participants:  Paul [name];  wife supplements hx ? ? ?Assessment/Plan:  ? ?1.  Parkinsons Disease ? -Take carbidopa/levodopa 25/100, 1.5 tablet at 8 AM/noon/4 PM.  Patient states that they tried to change that at the hospital to morning/evening/bedtime, but they are slowly changing that back to the prescribed times.  Told them that I do not want him taking that at bedtime and to change this back to the times that we previously prescribed. ? -We discussed that it used to be thought that levodopa would increase risk of melanoma but now it is believed that Parkinsons itself likely increases risk of melanoma. he is to get regular skin checks. ? -Patient currently on home physical therapy. ? ?2.  Status post hospitalization for community-acquired pneumonia ? -On oxygen, hopefully temporarily ? ? ? ?Subjective:  ? ?Paul [name] was seen today in follow up for Parkinsons disease.  My previous records were reviewed prior to todays visit as well as outside records available to me. Pt with wife who  supplements hx. last visit, we slightly increased his levodopa.  He reports that he tolerated that well, without side effects.  He did request video visit today as he has been in the hospital recently and felt too weak to come into the office.  Those notes have been reviewed.  Patient was hospitalized March 3 to March 9 due to sepsis from community-acquired pneumonia.  He was discharged on March 9 with home physical therapy.  He was discharged on oxygen. ? ?Current prescribed movement disorder medications: ?Carbidopa/levodopa 25/100, 1.5 tablet 3 times per day (increased last visit) ? ? ?ALLERGIES:   ?Allergies  ?Allergen Reactions  ? Sulfonamide Derivatives   ?  REACTION: rash  ? ? ?CURRENT MEDICATIONS:  ?Outpatient Encounter Medications as of 10/30/2021  ?Medication Sig  ? acetaminophen (TYLENOL) 500 MG tablet Take 1,000 mg by mouth every 6 (six) hours as needed.  ? albuterol (VENTOLIN HFA) 108 (90 Base) MCG/ACT inhaler Inhale 2 puffs into the lungs every 6 (six) hours as needed for wheezing or shortness of breath.  ? amLODipine (NORVASC) 5 MG tablet Take 1 tablet (5 mg total) by mouth daily.  ? aspirin 81 MG tablet Take 1 tablet (81 mg total) by mouth daily with breakfast.  ? atorvastatin (LIPITOR) 40 MG tablet Take 1 tablet (40 mg total) by mouth daily.  ? carbidopa-levodopa (SINEMET IR) 25-100 MG tablet TAKE 1 & 1/2 (ONE & ONE-HALF) TABLETS BY MOUTH THREE TIMES DAILY (Patient taking differently: Take by mouth See admin instructions. Take 1 and 1/2 tablet 3 times daily)  ? Cholecalciferol (VITAMIN D3) 2000  UNITS TABS Take 2,000 Units by mouth daily.  ? irbesartan (AVAPRO) 75 MG tablet Take 1 tablet (75 mg total) by mouth daily.  ? OXYGEN Inhale 4 L into the lungs continuous as needed.  ? pantoprazole (PROTONIX) 40 MG tablet Take 1 tablet (40 mg total) by mouth every other day.  ? ?No facility-administered encounter medications on file as of 10/30/2021.  ? ? ?Objective:  ? ?PHYSICAL EXAMINATION:   ? ?VITALS:    ?There were no vitals filed for this visit. ? ? ? ?GEN:  The patient appears stated age and is in NAD. ?HEENT:  Normocephalic, atraumatic.   ?Lungs: On oxygen ? ? ?Neurological examination: ? ?Orientation: The patient is alert and oriented x3. ?Cranial nerves: There is good facial symmetry with facial hypomimia. The speech is fluent and clear.  ? ?Motor: Strength is at least antigravity x4.   ? ?Movement examination: ?Tone: Unable ?Abnormal movements: Did not see it well at rest through video ?Coordination:  There is no significant decremation with RAM's, with any form of RAMS, including alternating supination and pronation of the forearm, hand opening and closing, finger taps, ?Gait and Station: The patient arises out of his chair well and actually was able to walk in his home fairly well with his oxygen on, even without his walker. ? ?I have reviewed and interpreted the following labs independently ? ?  Chemistry   ?   ?Component Value Date/Time  ? NA 144 10/17/2021 1148  ? NA 141 04/06/2013 0956  ? K 5.4 (H) 10/17/2021 1148  ? K 4.1 04/06/2013 0956  ? CL 101 10/17/2021 1148  ? CL 106 10/16/2012 1022  ? CO2 CANCELED 10/17/2021 1148  ? CO2 24 04/06/2013 0956  ? BUN 15 10/17/2021 1148  ? BUN 16.7 04/06/2013 0956  ? CREATININE CANCELED 10/17/2021 1148  ? CREATININE 1.0 04/06/2013 0956  ?    ?Component Value Date/Time  ? CALCIUM 10.2 10/17/2021 1148  ? CALCIUM 9.7 04/06/2013 0956  ? ALKPHOS 178 (H) 10/17/2021 1148  ? ALKPHOS 101 04/06/2013 0956  ? AST 59 (H) 10/17/2021 1148  ? AST 24 04/06/2013 0956  ? ALT 71 (H) 10/17/2021 1148  ? ALT 23 04/06/2013 0956  ? BILITOT 0.4 10/17/2021 1148  ? BILITOT 0.34 04/06/2013 0956  ?  ? ? ? ?Lab Results  ?Component Value Date  ? WBC 9.7 10/17/2021  ? HGB 13.4 10/17/2021  ? HCT 40.8 10/17/2021  ? MCV 97 10/17/2021  ? PLT 669 (H) 10/17/2021  ? ? ?Lab Results  ?Component Value Date  ? TSH 1.89 12/03/2017  ? ? ?Follow up Instructions  ? ?  ? -I discussed the assessment and treatment  plan with the patient. The patient was provided an opportunity to ask questions and all were answered. The patient agreed with the plan and demonstrated an understanding of the instructions. ?  ?The patient was advised to call back or seek an in-person evaluation if the symptoms worsen or if the condition fails to improve as anticipated. ? ? ? ? ?Alonza Bogus, DO ? ? ?Cc:  Dettinger, Fransisca Kaufmann, MD ? ?

## 2021-10-30 ENCOUNTER — Telehealth (INDEPENDENT_AMBULATORY_CARE_PROVIDER_SITE_OTHER): Payer: Medicare Other | Admitting: Neurology

## 2021-10-30 ENCOUNTER — Other Ambulatory Visit: Payer: Self-pay

## 2021-10-30 DIAGNOSIS — G20 Parkinson's disease: Secondary | ICD-10-CM

## 2021-10-30 MED ORDER — CARBIDOPA-LEVODOPA 25-100 MG PO TABS: 1.5000 | ORAL_TABLET | Freq: Three times a day (TID) | ORAL | 1 refills | Status: DC

## 2021-10-31 ENCOUNTER — Encounter: Payer: Self-pay | Admitting: Family Medicine

## 2021-10-31 ENCOUNTER — Ambulatory Visit (INDEPENDENT_AMBULATORY_CARE_PROVIDER_SITE_OTHER): Payer: Medicare Other | Admitting: Family Medicine

## 2021-10-31 ENCOUNTER — Ambulatory Visit (INDEPENDENT_AMBULATORY_CARE_PROVIDER_SITE_OTHER): Payer: Medicare Other

## 2021-10-31 VITALS — BP 116/64 | HR 78 | Ht 67.0 in | Wt 188.0 lb

## 2021-10-31 DIAGNOSIS — R7989 Other specified abnormal findings of blood chemistry: Secondary | ICD-10-CM

## 2021-10-31 DIAGNOSIS — J189 Pneumonia, unspecified organism: Secondary | ICD-10-CM | POA: Diagnosis not present

## 2021-10-31 DIAGNOSIS — D75839 Thrombocytosis, unspecified: Secondary | ICD-10-CM | POA: Diagnosis not present

## 2021-10-31 DIAGNOSIS — E875 Hyperkalemia: Secondary | ICD-10-CM | POA: Diagnosis not present

## 2021-10-31 DIAGNOSIS — J90 Pleural effusion, not elsewhere classified: Secondary | ICD-10-CM | POA: Diagnosis not present

## 2021-10-31 LAB — CMP14+EGFR
ALT: 13 IU/L (ref 0–44)
AST: 21 IU/L (ref 0–40)
Albumin/Globulin Ratio: 1.1 — ABNORMAL LOW (ref 1.2–2.2)
Albumin: 3.9 g/dL (ref 3.7–4.7)
Alkaline Phosphatase: 140 IU/L — ABNORMAL HIGH (ref 44–121)
BUN/Creatinine Ratio: 18 (ref 10–24)
BUN: 16 mg/dL (ref 8–27)
Bilirubin Total: 0.5 mg/dL (ref 0.0–1.2)
CO2: 27 mmol/L (ref 20–29)
Calcium: 9.8 mg/dL (ref 8.6–10.2)
Chloride: 97 mmol/L (ref 96–106)
Creatinine, Ser: 0.88 mg/dL (ref 0.76–1.27)
Globulin, Total: 3.4 g/dL (ref 1.5–4.5)
Glucose: 124 mg/dL — ABNORMAL HIGH (ref 70–99)
Potassium: 4.5 mmol/L (ref 3.5–5.2)
Sodium: 136 mmol/L (ref 134–144)
Total Protein: 7.3 g/dL (ref 6.0–8.5)
eGFR: 87 mL/min/{1.73_m2} (ref >59)

## 2021-10-31 LAB — CBC WITH DIFFERENTIAL/PLATELET
Basophils Absolute: 0.1 10*3/uL (ref 0.0–0.2)
Basos: 1 %
EOS (ABSOLUTE): 0.2 10*3/uL (ref 0.0–0.4)
Eos: 2 %
Hematocrit: 39.5 % (ref 37.5–51.0)
Hemoglobin: 13.4 g/dL (ref 13.0–17.7)
Immature Grans (Abs): 0 10*3/uL (ref 0.0–0.1)
Immature Granulocytes: 0 %
Lymphocytes Absolute: 2.7 10*3/uL (ref 0.7–3.1)
Lymphs: 25 %
MCH: 32.3 pg (ref 26.6–33.0)
MCHC: 33.9 g/dL (ref 31.5–35.7)
MCV: 95 fL (ref 79–97)
Monocytes Absolute: 0.9 10*3/uL (ref 0.1–0.9)
Monocytes: 9 %
Neutrophils Absolute: 6.9 10*3/uL (ref 1.4–7.0)
Neutrophils: 63 %
Platelets: 407 10*3/uL (ref 150–450)
RBC: 4.15 x10E6/uL (ref 4.14–5.80)
RDW: 12.7 % (ref 11.6–15.4)
WBC: 10.9 10*3/uL — ABNORMAL HIGH (ref 3.4–10.8)

## 2021-10-31 NOTE — Progress Notes (Signed)
? ?BP 116/64   Pulse 78   Ht 5' 7"  (1.702 m)   Wt 188 lb (85.3 kg)   SpO2 99%   BMI 29.44 kg/m?   ? ?Subjective:  ? ?Patient ID: Paul [name], male    DOB: February 18, 1941, 81 y.o.   MRN: [id number] ? ?HPI: ?[name] is a 81 y.o. male presenting on 10/31/2021 for Pneumonia (2 week follow up. Pt states that he is feeling better. Wife would like to know if he needs to continue inhaler daily and mucinex) ? ? ?HPI ?Pneumonia follow-up ?Patient is coming in today for pneumonia follow-up.  He is feeling development.  He is down from 4 L nasal cannula to 2 L nasal cannula now but still saturating about 92% on 2 L, if he came off of it like last night, He went down into the 80s very quickly.  He is still taking Mucinex and albuterol.  He does not feel like they are helping as much, he is not having that much of a cough and he really does feel better. ? ?Relevant past medical, surgical, family and social history reviewed and updated as indicated. Interim medical history since our last visit reviewed. ?Allergies and medications reviewed and updated. ? ?Review of Systems  ?Constitutional:  Negative for chills and fever.  ?HENT:  Negative for congestion, ear discharge, ear pain, postnasal drip, rhinorrhea, sinus pressure, sneezing, sore throat and voice change.   ?Eyes:  Negative for pain, discharge, redness and visual disturbance.  ?Respiratory:  Positive for cough and shortness of breath. Negative for wheezing.   ?Cardiovascular:  Negative for chest pain and leg swelling.  ?Musculoskeletal:  Negative for gait problem.  ?Skin:  Negative for rash.  ?All other systems reviewed and are negative. ? ?Per HPI unless specifically indicated above ? ? ?Allergies as of 10/31/2021   ? ?   Reactions  ? Sulfonamide Derivatives   ? REACTION: rash  ? ?  ? ?  ?Medication List  ?  ? ?  ? Accurate as of October 31, 2021  9:53 AM. If you have any questions, ask your nurse or doctor.  ?  ?  ? ?  ? ?acetaminophen 500 MG tablet ?Commonly  known as: TYLENOL ?Take 1,000 mg by mouth every 6 (six) hours as needed. ?  ?albuterol 108 (90 Base) MCG/ACT inhaler ?Commonly known as: VENTOLIN HFA ?Inhale 2 puffs into the lungs every 6 (six) hours as needed for wheezing or shortness of breath. ?  ?amLODipine 5 MG tablet ?Commonly known as: NORVASC ?Take 1 tablet (5 mg total) by mouth daily. ?  ?aspirin 81 MG tablet ?Take 1 tablet (81 mg total) by mouth daily with breakfast. ?  ?atorvastatin 40 MG tablet ?Commonly known as: LIPITOR ?Take 1 tablet (40 mg total) by mouth daily. ?  ?carbidopa-levodopa 25-100 MG tablet ?Commonly known as: SINEMET IR ?Take 1.5 tablets by mouth 3 (three) times daily. ?  ?guaiFENesin 600 MG 12 hr tablet ?Commonly known as: [place] ?Take by mouth 2 (two) times daily. ?  ?irbesartan 75 MG tablet ?Commonly known as: AVAPRO ?Take 1 tablet (75 mg total) by mouth daily. ?  ?OXYGEN ?Inhale 2 L into the lungs continuous as needed. ?  ?pantoprazole 40 MG tablet ?Commonly known as: PROTONIX ?Take 1 tablet (40 mg total) by mouth every other day. ?  ?Vitamin D3 50 MCG (2000 UT) Tabs ?Take 2,000 Units by mouth daily. ?  ? ?  ? ? ? ?Objective:  ? ?BP  116/64   Pulse 78   Ht 5' 7"  (1.702 m)   Wt 188 lb (85.3 kg)   SpO2 99%   BMI 29.44 kg/m?   ?Wt Readings from Last 3 Encounters:  ?10/31/21 188 lb (85.3 kg)  ?10/17/21 188 lb (85.3 kg)  ?10/08/21 202 lb 2.6 oz (91.7 kg)  ?  ?Physical Exam ?Vitals and nursing note reviewed.  ?Constitutional:   ?   General: He is not in acute distress. ?   Appearance: He is well-developed. He is not diaphoretic.  ?   Comments: On 2 L nasal cannula  ?Eyes:  ?   General: No scleral icterus. ?   Conjunctiva/sclera: Conjunctivae normal.  ?Neck:  ?   Thyroid: No thyromegaly.  ?Cardiovascular:  ?   Rate and Rhythm: Normal rate and regular rhythm.  ?   Heart sounds: Normal heart sounds. No murmur heard. ?Pulmonary:  ?   Effort: Pulmonary effort is normal. No respiratory distress.  ?   Breath sounds: Normal breath sounds. No  wheezing, rhonchi or rales.  ?Musculoskeletal:     ?   General: Normal range of motion.  ?   Cervical back: Neck supple.  ?Lymphadenopathy:  ?   Cervical: No cervical adenopathy.  ?Skin: ?   General: Skin is warm and dry.  ?   Findings: No rash.  ?Neurological:  ?   Mental Status: He is alert and oriented to person, place, and time.  ?   Coordination: Coordination normal.  ?Psychiatric:     ?   Behavior: Behavior normal.  ? ? ? ? ?Assessment & Plan:  ? ?Problem List Items Addressed This Visit   ?None ?Visit Diagnoses   ? ? Community acquired pneumonia of left upper lobe of lung    -  Primary  ? Relevant Orders  ? DG Chest 2 View  ? Hyperkalemia      ? Relevant Orders  ? CMP14+EGFR  ? Thrombocytosis      ? Relevant Orders  ? CBC with Differential/Platelet  ? Elevated liver function tests      ? Relevant Orders  ? CMP14+EGFR  ? ?  ?  ?Liver function and potassium platelets are all up, likely from infection, will recheck today. ? ?Recheck chest x-ray to check for improvement. ?Follow up plan: ?Return if symptoms worsen or fail to improve, for 3 to 4-week follow-up pneumonia. ? ?Counseling provided for all of the vaccine components ?Orders Placed This Encounter  ?Procedures  ? DG Chest 2 View  ? CMP14+EGFR  ? CBC with Differential/Platelet  ? ? ?Caryl Pina, MD ?[place] ?10/31/2021, 9:53 AM ? ? ? ? ?

## 2021-11-12 DIAGNOSIS — Z9181 History of falling: Secondary | ICD-10-CM | POA: Diagnosis not present

## 2021-11-12 DIAGNOSIS — J9601 Acute respiratory failure with hypoxia: Secondary | ICD-10-CM | POA: Diagnosis not present

## 2021-11-12 DIAGNOSIS — K219 Gastro-esophageal reflux disease without esophagitis: Secondary | ICD-10-CM | POA: Diagnosis not present

## 2021-11-12 DIAGNOSIS — D509 Iron deficiency anemia, unspecified: Secondary | ICD-10-CM | POA: Diagnosis not present

## 2021-11-12 DIAGNOSIS — Z7982 Long term (current) use of aspirin: Secondary | ICD-10-CM | POA: Diagnosis not present

## 2021-11-12 DIAGNOSIS — I10 Essential (primary) hypertension: Secondary | ICD-10-CM | POA: Diagnosis not present

## 2021-11-12 DIAGNOSIS — A419 Sepsis, unspecified organism: Secondary | ICD-10-CM | POA: Diagnosis not present

## 2021-11-12 DIAGNOSIS — Z8546 Personal history of malignant neoplasm of prostate: Secondary | ICD-10-CM | POA: Diagnosis not present

## 2021-11-12 DIAGNOSIS — E785 Hyperlipidemia, unspecified: Secondary | ICD-10-CM | POA: Diagnosis not present

## 2021-11-12 DIAGNOSIS — E1169 Type 2 diabetes mellitus with other specified complication: Secondary | ICD-10-CM | POA: Diagnosis not present

## 2021-11-12 DIAGNOSIS — Z87891 Personal history of nicotine dependence: Secondary | ICD-10-CM | POA: Diagnosis not present

## 2021-11-12 DIAGNOSIS — N179 Acute kidney failure, unspecified: Secondary | ICD-10-CM | POA: Diagnosis not present

## 2021-11-12 DIAGNOSIS — G20 Parkinson's disease: Secondary | ICD-10-CM | POA: Diagnosis not present

## 2021-11-12 DIAGNOSIS — J181 Lobar pneumonia, unspecified organism: Secondary | ICD-10-CM | POA: Diagnosis not present

## 2021-11-28 ENCOUNTER — Ambulatory Visit: Payer: Medicare Other | Admitting: Family Medicine

## 2021-12-06 ENCOUNTER — Ambulatory Visit (INDEPENDENT_AMBULATORY_CARE_PROVIDER_SITE_OTHER): Payer: Medicare Other

## 2021-12-06 ENCOUNTER — Encounter: Payer: Self-pay | Admitting: Family Medicine

## 2021-12-06 ENCOUNTER — Ambulatory Visit (INDEPENDENT_AMBULATORY_CARE_PROVIDER_SITE_OTHER): Payer: Medicare Other | Admitting: Family Medicine

## 2021-12-06 VITALS — BP 126/93 | HR 79 | Ht 67.0 in | Wt 193.0 lb

## 2021-12-06 DIAGNOSIS — J189 Pneumonia, unspecified organism: Secondary | ICD-10-CM | POA: Diagnosis not present

## 2021-12-06 NOTE — Progress Notes (Signed)
? ?BP (!) 126/93   Pulse 79   Ht '5\' 7"'$  (1.702 m)   Wt 193 lb (87.5 kg)   SpO2 93%   BMI 30.23 kg/m?   ? ?Subjective:  ? ?Patient ID: Paul [name], male    DOB: [birth year], 81 y.o.   MRN: [id number] ? ?HPI: ?Paul [name] is a 81 y.o. male presenting on 12/06/2021 for Medical Management of Chronic Issues and Pneumonia (4 week follow- has not used O2 in one week. Finished PT about one week, finished OT 2 weeks ago) ? ? ?HPI ?Patient is coming in today for pneumonia follow-up.  He has been doing a lot better and he is finally tapered off the oxygen and has been doing physical therapy and he just finished with the therapy today and he feels like he is getting around a lot better and almost back to his normal baseline.  He denies any shortness of breath or wheezing or coughing. ? ?Relevant past medical, surgical, family and social history reviewed and updated as indicated. Interim medical history since our last visit reviewed. ?Allergies and medications reviewed and updated. ? ?Review of Systems  ?Constitutional:  Negative for chills and fever.  ?Eyes:  Negative for visual disturbance.  ?Respiratory:  Negative for shortness of breath and wheezing.   ?Cardiovascular:  Negative for chest pain and leg swelling.  ?Musculoskeletal:  Negative for back pain and gait problem.  ?Skin:  Negative for rash.  ?Neurological:  Negative for dizziness, weakness and light-headedness.  ?All other systems reviewed and are negative. ? ?Per HPI unless specifically indicated above ? ? ?Allergies as of 12/06/2021   ? ?   Reactions  ? Sulfonamide Derivatives   ? REACTION: rash  ? ?  ? ?  ?Medication List  ?  ? ?  ? Accurate as of Dec 06, 2021  4:16 PM. If you have any questions, ask your nurse or doctor.  ?  ?  ? ?  ? ?acetaminophen 500 MG tablet ?Commonly known as: TYLENOL ?Take 1,000 mg by mouth every 6 (six) hours as needed. ?  ?albuterol 108 (90 Base) MCG/ACT inhaler ?Commonly known as: VENTOLIN HFA ?Inhale 2 puffs into the lungs every  6 (six) hours as needed for wheezing or shortness of breath. ?  ?amLODipine 5 MG tablet ?Commonly known as: NORVASC ?Take 1 tablet (5 mg total) by mouth daily. ?  ?aspirin 81 MG tablet ?Take 1 tablet (81 mg total) by mouth daily with breakfast. ?  ?atorvastatin 40 MG tablet ?Commonly known as: LIPITOR ?Take 1 tablet (40 mg total) by mouth daily. ?  ?carbidopa-levodopa 25-100 MG tablet ?Commonly known as: SINEMET IR ?Take 1.5 tablets by mouth 3 (three) times daily. ?  ?guaiFENesin 600 MG 12 hr tablet ?Commonly known as: [place] ?Take by mouth 2 (two) times daily. ?  ?irbesartan 75 MG tablet ?Commonly known as: AVAPRO ?Take 1 tablet (75 mg total) by mouth daily. ?  ?OXYGEN ?Inhale 2 L into the lungs continuous as needed. ?  ?pantoprazole 40 MG tablet ?Commonly known as: PROTONIX ?Take 1 tablet (40 mg total) by mouth every other day. ?  ?Vitamin D3 50 MCG (2000 UT) Tabs ?Take 2,000 Units by mouth daily. ?  ? ?  ? ? ? ?Objective:  ? ?BP (!) 126/93   Pulse 79   Ht '5\' 7"'$  (1.702 m)   Wt 193 lb (87.5 kg)   SpO2 93%   BMI 30.23 kg/m?   ?Wt Readings from Last 3 Encounters:  ?  12/06/21 193 lb (87.5 kg)  ?10/31/21 188 lb (85.3 kg)  ?10/17/21 188 lb (85.3 kg)  ?  ?Physical Exam ?Vitals and nursing note reviewed.  ?Constitutional:   ?   General: He is not in acute distress. ?   Appearance: He is well-developed. He is not diaphoretic.  ?Eyes:  ?   General: No scleral icterus. ?   Conjunctiva/sclera: Conjunctivae normal.  ?Neck:  ?   Thyroid: No thyromegaly.  ?Cardiovascular:  ?   Rate and Rhythm: Normal rate and regular rhythm.  ?   Heart sounds: Normal heart sounds. No murmur heard. ?Pulmonary:  ?   Effort: Pulmonary effort is normal. No respiratory distress.  ?   Breath sounds: Normal breath sounds. No wheezing.  ?Musculoskeletal:     ?   General: No swelling. Normal range of motion.  ?   Cervical back: Neck supple.  ?Lymphadenopathy:  ?   Cervical: No cervical adenopathy.  ?Skin: ?   General: Skin is warm and dry.  ?    Findings: No rash.  ?Neurological:  ?   Mental Status: He is alert and oriented to person, place, and time.  ?   Coordination: Coordination normal.  ?Psychiatric:     ?   Behavior: Behavior normal.  ? ? ? ? ?Assessment & Plan:  ? ?Problem List Items Addressed This Visit   ?None ?Visit Diagnoses   ? ? Community acquired pneumonia of left upper lobe of lung    -  Primary  ? Relevant Orders  ? DG Chest 2 View  ? ?  ?Recheck on chest x-ray, await final read from radiology. ? ?Follow up plan: ?Return if symptoms worsen or fail to improve. ? ?Counseling provided for all of the vaccine components ?Orders Placed This Encounter  ?Procedures  ? DG Chest 2 View  ? ? ?Caryl Pina, MD ?[place] ?12/06/2021, 4:16 PM ? ? ?  ?

## 2021-12-11 DIAGNOSIS — J9601 Acute respiratory failure with hypoxia: Secondary | ICD-10-CM | POA: Diagnosis not present

## 2021-12-11 DIAGNOSIS — A419 Sepsis, unspecified organism: Secondary | ICD-10-CM | POA: Diagnosis not present

## 2021-12-11 DIAGNOSIS — J189 Pneumonia, unspecified organism: Secondary | ICD-10-CM | POA: Diagnosis not present

## 2022-01-04 ENCOUNTER — Ambulatory Visit (INDEPENDENT_AMBULATORY_CARE_PROVIDER_SITE_OTHER): Payer: Medicare Other | Admitting: Family Medicine

## 2022-01-04 ENCOUNTER — Encounter: Payer: Self-pay | Admitting: Family Medicine

## 2022-01-04 VITALS — BP 128/67 | HR 69 | Temp 98.3°F | Ht 67.0 in | Wt 193.0 lb

## 2022-01-04 DIAGNOSIS — E782 Mixed hyperlipidemia: Secondary | ICD-10-CM

## 2022-01-04 DIAGNOSIS — I10 Essential (primary) hypertension: Secondary | ICD-10-CM

## 2022-01-04 DIAGNOSIS — E1169 Type 2 diabetes mellitus with other specified complication: Secondary | ICD-10-CM | POA: Diagnosis not present

## 2022-01-04 LAB — CBC WITH DIFFERENTIAL/PLATELET
Basophils Absolute: 0 10*3/uL (ref 0.0–0.2)
Basos: 1 %
EOS (ABSOLUTE): 0.1 10*3/uL (ref 0.0–0.4)
Eos: 2 %
Hematocrit: 40.7 % (ref 37.5–51.0)
Hemoglobin: 13.8 g/dL (ref 13.0–17.7)
Immature Grans (Abs): 0 10*3/uL (ref 0.0–0.1)
Immature Granulocytes: 0 %
Lymphocytes Absolute: 3.4 10*3/uL — ABNORMAL HIGH (ref 0.7–3.1)
Lymphs: 48 %
MCH: 32.8 pg (ref 26.6–33.0)
MCHC: 33.9 g/dL (ref 31.5–35.7)
MCV: 97 fL (ref 79–97)
Monocytes Absolute: 0.6 10*3/uL (ref 0.1–0.9)
Monocytes: 9 %
Neutrophils Absolute: 2.8 10*3/uL (ref 1.4–7.0)
Neutrophils: 40 %
Platelets: 340 10*3/uL (ref 150–450)
RBC: 4.21 x10E6/uL (ref 4.14–5.80)
RDW: 13.2 % (ref 11.6–15.4)
WBC: 7 10*3/uL (ref 3.4–10.8)

## 2022-01-04 LAB — BAYER DCA HB A1C WAIVED: HB A1C (BAYER DCA - WAIVED): 5.9 % — ABNORMAL HIGH (ref 4.8–5.6)

## 2022-01-04 NOTE — Progress Notes (Signed)
BP 128/67   Pulse 69   Temp 98.3 F (36.8 C)   Ht '5\' 7"'$  (1.702 m)   Wt 193 lb (87.5 kg)   SpO2 94%   BMI 30.23 kg/m    Subjective:   Patient ID: Paul [name], male    DOB: May 29, 1941, 81 y.o.   MRN: [id number]  HPI: Paul [name] is a 81 y.o. male presenting on 01/04/2022 for Medical Management of Chronic Issues and Diabetes   HPI Type 2 diabetes mellitus Patient comes in today for recheck of his diabetes. Patient has been currently taking no medicine, diet controlled, A1c looks good at 5.9.. Patient is currently on an ACE inhibitor/ARB. Patient has not seen an ophthalmologist this year. Patient denies any issues with their feet. The symptom started onset as an adult hypertension and hyperlipidemia ARE RELATED TO DM   Hypertension Patient is currently on amlodipine and a irbesartan, and their blood pressure today is 128/67. Patient denies any lightheadedness or dizziness. Patient denies headaches, blurred vision, chest pains, shortness of breath, or weakness. Denies any side effects from medication and is content with current medication.   Hyperlipidemia Patient is coming in for recheck of his hyperlipidemia. The patient is currently taking atorvastatin. They deny any issues with myalgias or history of liver damage from it. They deny any focal numbness or weakness or chest pain.   Elevated white blood cell count, last time he was checked he did have an elevated white blood cell count.  He just had recovered from pneumonia so that is likely what was elevated but we will check it again today.  Relevant past medical, surgical, family and social history reviewed and updated as indicated. Interim medical history since our last visit reviewed. Allergies and medications reviewed and updated.  Review of Systems  Constitutional:  Negative for chills and fever.  Eyes:  Negative for visual disturbance.  Respiratory:  Negative for shortness of breath and wheezing.   Cardiovascular:   Negative for chest pain and leg swelling.  Musculoskeletal:  Negative for back pain and gait problem.  Skin:  Negative for rash.  Neurological:  Negative for dizziness, weakness and light-headedness.  All other systems reviewed and are negative.  Per HPI unless specifically indicated above   Allergies as of 01/04/2022       Reactions   Sulfonamide Derivatives    REACTION: rash        Medication List        Accurate as of January 04, 2022  9:35 AM. If you have any questions, ask your nurse or doctor.          acetaminophen 500 MG tablet Commonly known as: TYLENOL Take 1,000 mg by mouth every 6 (six) hours as needed.   albuterol 108 (90 Base) MCG/ACT inhaler Commonly known as: VENTOLIN HFA Inhale 2 puffs into the lungs every 6 (six) hours as needed for wheezing or shortness of breath.   amLODipine 5 MG tablet Commonly known as: NORVASC Take 1 tablet (5 mg total) by mouth daily.   aspirin 81 MG tablet Take 1 tablet (81 mg total) by mouth daily with breakfast.   atorvastatin 40 MG tablet Commonly known as: LIPITOR Take 1 tablet (40 mg total) by mouth daily.   carbidopa-levodopa 25-100 MG tablet Commonly known as: SINEMET IR Take 1.5 tablets by mouth 3 (three) times daily.   guaiFENesin 600 MG 12 hr tablet Commonly known as: MUCINEX Take by mouth 2 (two) times daily.   irbesartan  75 MG tablet Commonly known as: AVAPRO Take 1 tablet (75 mg total) by mouth daily.   OXYGEN Inhale 2 L into the lungs continuous as needed.   pantoprazole 40 MG tablet Commonly known as: PROTONIX Take 1 tablet (40 mg total) by mouth every other day.   Vitamin D3 50 MCG (2000 UT) Tabs Take 2,000 Units by mouth daily.         Objective:   BP 128/67   Pulse 69   Temp 98.3 F (36.8 C)   Ht '5\' 7"'$  (1.702 m)   Wt 193 lb (87.5 kg)   SpO2 94%   BMI 30.23 kg/m   Wt Readings from Last 3 Encounters:  01/04/22 193 lb (87.5 kg)  12/06/21 193 lb (87.5 kg)  10/31/21 188 lb (85.3  kg)    Physical Exam Vitals and nursing note reviewed.  Constitutional:      General: He is not in acute distress.    Appearance: He is well-developed. He is not diaphoretic.  Eyes:     General: No scleral icterus.    Conjunctiva/sclera: Conjunctivae normal.  Neck:     Thyroid: No thyromegaly.  Cardiovascular:     Rate and Rhythm: Normal rate and regular rhythm.     Heart sounds: Normal heart sounds. No murmur heard. Pulmonary:     Effort: Pulmonary effort is normal. No respiratory distress.     Breath sounds: Normal breath sounds. No wheezing.  Musculoskeletal:        General: No swelling. Normal range of motion.     Cervical back: Neck supple.  Lymphadenopathy:     Cervical: No cervical adenopathy.  Skin:    General: Skin is warm and dry.     Findings: No rash.  Neurological:     Mental Status: He is alert and oriented to person, place, and time.     Motor: Tremor present.     Coordination: Coordination normal.  Psychiatric:        Behavior: Behavior normal.      Assessment & Plan:   Problem List Items Addressed This Visit       Cardiovascular and Mediastinum   HTN (hypertension)     Endocrine   Type 2 diabetes mellitus with other specified complication ([place]) - Primary   Relevant Orders   CBC with Differential/Platelet   Bayer DCA Hb A1c Waived     Other   HLD (hyperlipidemia)    Seems to be doing well, will recheck blood work and recheck his white count which is likely due to inflammation from his pneumonia that he had.  A1c is 5.9.  Continue with diet, looks good. Follow up plan: Return in about 3 months (around 04/06/2022), or if symptoms worsen or fail to improve, for Diabetes recheck.  Counseling provided for all of the vaccine components Orders Placed This Encounter  Procedures   CBC with Differential/Platelet   Bayer DCA Hb A1c [place], MD [facility] 01/04/2022, 9:35 AM

## 2022-04-11 ENCOUNTER — Encounter: Payer: Self-pay | Admitting: Family Medicine

## 2022-04-11 ENCOUNTER — Ambulatory Visit (INDEPENDENT_AMBULATORY_CARE_PROVIDER_SITE_OTHER): Payer: Medicare Other | Admitting: Family Medicine

## 2022-04-11 VITALS — BP 125/75 | HR 67 | Temp 97.4°F | Ht 67.0 in | Wt 192.0 lb

## 2022-04-11 DIAGNOSIS — E1169 Type 2 diabetes mellitus with other specified complication: Secondary | ICD-10-CM

## 2022-04-11 DIAGNOSIS — E1159 Type 2 diabetes mellitus with other circulatory complications: Secondary | ICD-10-CM

## 2022-04-11 DIAGNOSIS — I152 Hypertension secondary to endocrine disorders: Secondary | ICD-10-CM

## 2022-04-11 DIAGNOSIS — E782 Mixed hyperlipidemia: Secondary | ICD-10-CM | POA: Diagnosis not present

## 2022-04-11 DIAGNOSIS — I10 Essential (primary) hypertension: Secondary | ICD-10-CM | POA: Diagnosis not present

## 2022-04-11 LAB — BAYER DCA HB A1C WAIVED: HB A1C (BAYER DCA - WAIVED): 6.2 % — ABNORMAL HIGH (ref 4.8–5.6)

## 2022-04-11 MED ORDER — AMLODIPINE BESYLATE 5 MG PO TABS: 5.0000 mg | ORAL_TABLET | Freq: Every day | ORAL | 3 refills | Status: DC

## 2022-04-11 MED ORDER — IRBESARTAN 75 MG PO TABS: 75.0000 mg | ORAL_TABLET | Freq: Every day | ORAL | 3 refills | Status: DC

## 2022-04-11 NOTE — Progress Notes (Signed)
BP 125/75   Pulse 67   Temp (!) 97.4 F (36.3 C)   Ht 5' 7"  (1.702 m)   Wt 192 lb (87.1 kg)   SpO2 94%   BMI 30.07 kg/m    Subjective:   Patient ID: Paul [name], male    DOB: 1940/08/22, 81 y.o.   MRN: [id number]  HPI: Paul [name] is a 81 y.o. male presenting on 04/11/2022 for Medical Management of Chronic Issues, Diabetes, Hypertension, and Arm Pain (Right- fell about one year ago. Muscle tear seen on xray. Pain not going away.)   HPI Type 2 diabetes mellitus Patient comes in today for recheck of his diabetes. Patient has been currently taking no medicine, diet control. Patient is currently on an ACE inhibitor/ARB. Patient has not seen an ophthalmologist this year. Patient denies any issues with their feet. The symptom started onset as an adult hypertension and hyperlipidemia ARE RELATED TO DM   Hypertension Patient is currently on irbesartan and amlodipine, and their blood pressure today is 125/75. Patient denies any lightheadedness or dizziness. Patient denies headaches, blurred vision, chest pains, shortness of breath, or weakness. Denies any side effects from medication and is content with current medication.   Hyperlipidemia Patient is coming in for recheck of his hyperlipidemia. The patient is currently taking atorvastatin. They deny any issues with myalgias or history of liver damage from it. They deny any focal numbness or weakness or chest pain.   Relevant past medical, surgical, family and social history reviewed and updated as indicated. Interim medical history since our last visit reviewed. Allergies and medications reviewed and updated.  Review of Systems  Constitutional:  Negative for chills and fever.  Eyes:  Negative for visual disturbance.  Respiratory:  Negative for shortness of breath and wheezing.   Cardiovascular:  Negative for chest pain and leg swelling.  Musculoskeletal:  Negative for back pain and gait problem.  Skin:  Negative for rash.   Neurological:  Negative for dizziness, weakness and light-headedness.  All other systems reviewed and are negative.   Per HPI unless specifically indicated above   Allergies as of 04/11/2022       Reactions   Sulfonamide Derivatives    REACTION: rash        Medication List        Accurate as of April 11, 2022 11:24 AM. If you have any questions, ask your nurse or doctor.          STOP taking these medications    OXYGEN Stopped by: Fransisca Kaufmann Sher Hellinger, MD       TAKE these medications    acetaminophen 500 MG tablet Commonly known as: TYLENOL Take 1,000 mg by mouth every 6 (six) hours as needed.   albuterol 108 (90 Base) MCG/ACT inhaler Commonly known as: VENTOLIN HFA Inhale 2 puffs into the lungs every 6 (six) hours as needed for wheezing or shortness of breath.   amLODipine 5 MG tablet Commonly known as: NORVASC Take 1 tablet (5 mg total) by mouth daily.   aspirin 81 MG tablet Take 1 tablet (81 mg total) by mouth daily with breakfast.   atorvastatin 40 MG tablet Commonly known as: LIPITOR Take 1 tablet (40 mg total) by mouth daily.   carbidopa-levodopa 25-100 MG tablet Commonly known as: SINEMET IR Take 1.5 tablets by mouth 3 (three) times daily.   guaiFENesin 600 MG 12 hr tablet Commonly known as: MUCINEX Take by mouth 2 (two) times daily.   irbesartan 75 MG  tablet Commonly known as: AVAPRO Take 1 tablet (75 mg total) by mouth daily.   pantoprazole 40 MG tablet Commonly known as: PROTONIX Take 1 tablet (40 mg total) by mouth every other day.   Vitamin D3 50 MCG (2000 UT) Tabs Take 2,000 Units by mouth daily.         Objective:   BP 125/75   Pulse 67   Temp (!) 97.4 F (36.3 C)   Ht 5' 7"  (1.702 m)   Wt 192 lb (87.1 kg)   SpO2 94%   BMI 30.07 kg/m   Wt Readings from Last 3 Encounters:  04/11/22 192 lb (87.1 kg)  01/04/22 193 lb (87.5 kg)  12/06/21 193 lb (87.5 kg)    Physical Exam Vitals and nursing note reviewed.   Constitutional:      General: He is not in acute distress.    Appearance: He is well-developed. He is not diaphoretic.  Eyes:     General: No scleral icterus.    Conjunctiva/sclera: Conjunctivae normal.  Neck:     Thyroid: No thyromegaly.  Cardiovascular:     Rate and Rhythm: Normal rate and regular rhythm.     Heart sounds: Normal heart sounds. No murmur heard. Pulmonary:     Effort: Pulmonary effort is normal. No respiratory distress.     Breath sounds: Normal breath sounds. No wheezing.  Musculoskeletal:        General: No swelling. Normal range of motion.     Cervical back: Neck supple.  Lymphadenopathy:     Cervical: No cervical adenopathy.  Skin:    General: Skin is warm and dry.     Findings: No rash.  Neurological:     Mental Status: He is alert and oriented to person, place, and time.     Coordination: Coordination normal.  Psychiatric:        Behavior: Behavior normal.       Assessment & Plan:   Problem List Items Addressed This Visit       Cardiovascular and Mediastinum   HTN (hypertension)   Relevant Medications   amLODipine (NORVASC) 5 MG tablet   irbesartan (AVAPRO) 75 MG tablet   Other Relevant Orders   CBC with Differential/Platelet   CMP14+EGFR   Lipid panel   Bayer DCA Hb A1c Waived     Endocrine   Type 2 diabetes mellitus with other specified complication ([facility]) - Primary   Relevant Medications   irbesartan (AVAPRO) 75 MG tablet   Other Relevant Orders   CBC with Differential/Platelet   CMP14+EGFR   Lipid panel   Bayer DCA Hb A1c Waived     Other   HLD (hyperlipidemia)   Relevant Medications   amLODipine (NORVASC) 5 MG tablet   irbesartan (AVAPRO) 75 MG tablet   Other Relevant Orders   CBC with Differential/Platelet   CMP14+EGFR   Lipid panel   Bayer DCA Hb A1c Waived   Other Visit Diagnoses     Hypertension associated with diabetes ([facility])  (Chronic)      Relevant Medications   amLODipine (NORVASC) 5 MG tablet   irbesartan  (AVAPRO) 75 MG tablet       A1c looks good at 6.2, blood pressure looks good.  No changes.  Recommended for his right arm that he do some stretches and exercises to strengthen, do not think he would be a great candidate for surgery at this point. Follow up plan: Return in about 3 months (around 07/11/2022), or if symptoms worsen or  fail to improve, for Diabetes recheck.  Counseling provided for all of the vaccine components Orders Placed This Encounter  Procedures   CBC with Differential/Platelet   CMP14+EGFR   Lipid panel   Bayer DCA Hb A1c [place], MD [facility] 04/11/2022, 11:24 AM

## 2022-04-12 LAB — CMP14+EGFR
ALT: 10 IU/L (ref 0–44)
AST: 16 IU/L (ref 0–40)
Albumin/Globulin Ratio: 1.7 (ref 1.2–2.2)
Albumin: 4.4 g/dL (ref 3.8–4.8)
Alkaline Phosphatase: 113 IU/L (ref 44–121)
BUN/Creatinine Ratio: 12 (ref 10–24)
BUN: 11 mg/dL (ref 8–27)
Bilirubin Total: 0.9 mg/dL (ref 0.0–1.2)
CO2: 22 mmol/L (ref 20–29)
Calcium: 9.8 mg/dL (ref 8.6–10.2)
Chloride: 100 mmol/L (ref 96–106)
Creatinine, Ser: 0.9 mg/dL (ref 0.76–1.27)
Globulin, Total: 2.6 g/dL (ref 1.5–4.5)
Glucose: 174 mg/dL — ABNORMAL HIGH (ref 70–99)
Potassium: 4.5 mmol/L (ref 3.5–5.2)
Sodium: 140 mmol/L (ref 134–144)
Total Protein: 7 g/dL (ref 6.0–8.5)
eGFR: 86 mL/min/{1.73_m2} (ref >59)

## 2022-04-12 LAB — CBC WITH DIFFERENTIAL/PLATELET
Basophils Absolute: 0 10*3/uL (ref 0.0–0.2)
Basos: 1 %
EOS (ABSOLUTE): 0.1 10*3/uL (ref 0.0–0.4)
Eos: 1 %
Hematocrit: 41.6 % (ref 37.5–51.0)
Hemoglobin: 13.9 g/dL (ref 13.0–17.7)
Immature Grans (Abs): 0 10*3/uL (ref 0.0–0.1)
Immature Granulocytes: 0 %
Lymphocytes Absolute: 2.9 10*3/uL (ref 0.7–3.1)
Lymphs: 36 %
MCH: 32.3 pg (ref 26.6–33.0)
MCHC: 33.4 g/dL (ref 31.5–35.7)
MCV: 97 fL (ref 79–97)
Monocytes Absolute: 0.7 10*3/uL (ref 0.1–0.9)
Monocytes: 8 %
Neutrophils Absolute: 4.3 10*3/uL (ref 1.4–7.0)
Neutrophils: 54 %
Platelets: 338 10*3/uL (ref 150–450)
RBC: 4.3 x10E6/uL (ref 4.14–5.80)
RDW: 13.1 % (ref 11.6–15.4)
WBC: 8 10*3/uL (ref 3.4–10.8)

## 2022-04-12 LAB — LIPID PANEL
Chol/HDL Ratio: 3 ratio (ref 0.0–5.0)
Cholesterol, Total: 155 mg/dL (ref 100–199)
HDL: 51 mg/dL (ref >39)
LDL Chol Calc (NIH): 86 mg/dL (ref 0–99)
Triglycerides: 99 mg/dL (ref 0–149)
VLDL Cholesterol Cal: 18 mg/dL (ref 5–40)

## 2022-05-01 NOTE — Progress Notes (Unsigned)
Assessment/Plan:   1.  Parkinsons Disease  -continue carbidopa/levodopa 25/100, 1.5 tablet at 8 AM/noon/4 PM.    -needs to increase exercise  -We discussed that it used to be thought that levodopa would increase risk of melanoma but now it is believed that Parkinsons itself likely increases risk of melanoma. he is to get regular skin checks.  Names given today     Subjective:   Paul [name] was seen today in follow up for Parkinsons disease.  My previous records were reviewed prior to todays visit as well as outside records available to me. Pt with wife who supplements hx. last visit.  Last visit, I saw him only on video, because he had just gotten out of the hospital from sepsis and was feeling weak.  He is doing better.  No falls.  Last saw his primary care September 7.  He is not on diabetes medicine.  A1c is at 6.2.  no falls  Current prescribed movement disorder medications: Carbidopa/levodopa 25/100, 1.5 tablet 3 times per day.   ALLERGIES:   Allergies  Allergen Reactions   Sulfonamide Derivatives     REACTION: rash    CURRENT MEDICATIONS:  Outpatient Encounter Medications as of 05/02/2022  Medication Sig   acetaminophen (TYLENOL) 500 MG tablet Take 1,000 mg by mouth every 6 (six) hours as needed.   amLODipine (NORVASC) 5 MG tablet Take 1 tablet (5 mg total) by mouth daily.   aspirin 81 MG tablet Take 1 tablet (81 mg total) by mouth daily with breakfast.   atorvastatin (LIPITOR) 40 MG tablet Take 1 tablet (40 mg total) by mouth daily.   carbidopa-levodopa (SINEMET IR) 25-100 MG tablet Take 1.5 tablets by mouth 3 (three) times daily.   Cholecalciferol (VITAMIN D3) 2000 UNITS TABS Take 2,000 Units by mouth daily.   irbesartan (AVAPRO) 75 MG tablet Take 1 tablet (75 mg total) by mouth daily.   pantoprazole (PROTONIX) 40 MG tablet Take 1 tablet (40 mg total) by mouth every other day.   albuterol (VENTOLIN HFA) 108 (90 Base) MCG/ACT inhaler Inhale 2 puffs into the lungs  every 6 (six) hours as needed for wheezing or shortness of breath. (Patient not taking: Reported on 05/02/2022)   guaiFENesin (MUCINEX) 600 MG 12 hr tablet Take by mouth 2 (two) times daily. (Patient not taking: Reported on 05/02/2022)   No facility-administered encounter medications on file as of 05/02/2022.    Objective:   PHYSICAL EXAMINATION:    VITALS:   Vitals:   05/02/22 0906  BP: 118/72  Pulse: 64  SpO2: 95%  Weight: 194 lb 3.2 oz (88.1 kg)  Height: '5\' 8"'$  (1.727 m)      GEN:  The patient appears stated age and is in NAD. HEENT:  Normocephalic, atraumatic.  The mucous membranes are moist. The superficial temporal arteries are without ropiness or tenderness.   Neurological examination:  Orientation: The patient is alert and oriented x3. Cranial nerves: There is good facial symmetry with facial hypomimia. The speech is fluent and clear.  Hearing is intact to conversational tone. Sensation: Sensation is intact to light touch throughout Motor: Strength is at least antigravity x4.   Movement examination: Tone: There is mild increased tone in the RUE Abnormal movements: there is RUE rest tremor, overall mild. Coordination:  There is no significant decremation with RAM's, with any form of RAMS, including alternating supination and pronation of the forearm, hand opening and closing, finger taps, heel taps and toe taps. Gait and Station:  The patient has min difficulty arising out of a deep-seated chair without the use of the hands. The patient's stride length is good.  He has almost no arm swing on the right.  He slightly drags the R leg  I have reviewed and interpreted the following labs independently    Chemistry      Component Value Date/Time   NA 140 04/11/2022 1047   NA 141 04/06/2013 0956   K 4.5 04/11/2022 1047   K 4.1 04/06/2013 0956   CL 100 04/11/2022 1047   CL 106 10/16/2012 1022   CO2 22 04/11/2022 1047   CO2 24 04/06/2013 0956   BUN 11 04/11/2022 1047    BUN 16.7 04/06/2013 0956   CREATININE 0.90 04/11/2022 1047   CREATININE 1.0 04/06/2013 0956      Component Value Date/Time   CALCIUM 9.8 04/11/2022 1047   CALCIUM 9.7 04/06/2013 0956   ALKPHOS 113 04/11/2022 1047   ALKPHOS 101 04/06/2013 0956   AST 16 04/11/2022 1047   AST 24 04/06/2013 0956   ALT 10 04/11/2022 1047   ALT 23 04/06/2013 0956   BILITOT 0.9 04/11/2022 1047   BILITOT 0.34 04/06/2013 0956       Lab Results  Component Value Date   WBC 8.0 04/11/2022   HGB 13.9 04/11/2022   HCT 41.6 04/11/2022   MCV 97 04/11/2022   PLT 338 04/11/2022    Lab Results  Component Value Date   TSH 1.89 12/03/2017     Total time spent on today's visit was 20 minutes, including both face-to-face time and nonface-to-face time.  Time included that spent on review of records (prior notes available to me/labs/imaging if pertinent), discussing treatment and goals, answering patient's questions and coordinating care.  Cc:  Dettinger, Fransisca Kaufmann, MD

## 2022-05-02 ENCOUNTER — Ambulatory Visit: Payer: Medicare Other | Admitting: Neurology

## 2022-05-02 ENCOUNTER — Encounter: Payer: Self-pay | Admitting: Neurology

## 2022-05-02 VITALS — BP 118/72 | HR 64 | Ht 68.0 in | Wt 194.2 lb

## 2022-05-02 DIAGNOSIS — G20 Parkinson's disease: Secondary | ICD-10-CM | POA: Diagnosis not present

## 2022-05-02 MED ORDER — CARBIDOPA-LEVODOPA 25-100 MG PO TABS: 1.5000 | ORAL_TABLET | Freq: Three times a day (TID) | ORAL | 1 refills | Status: DC

## 2022-05-02 NOTE — Patient Instructions (Signed)
As we discussed, it used to be thought that levodopa would increase risk of melanoma but now it is believed that Parkinsons itself likely increases risk of melanoma. I recommend yearly skin checks with a board certified dermatologist.  You can call [facility] or [facility] for an appointment. ° °[facility] °Address: [address], [place], [place] [address] °Phone: [phone] ° °[facility] °[address], [place], [place] °Phone: [phone] ° °

## 2022-05-17 ENCOUNTER — Ambulatory Visit: Payer: Medicare Other

## 2022-05-23 DIAGNOSIS — H5203 Hypermetropia, bilateral: Secondary | ICD-10-CM | POA: Diagnosis not present

## 2022-05-30 DIAGNOSIS — N393 Stress incontinence (female) (male): Secondary | ICD-10-CM | POA: Diagnosis not present

## 2022-05-30 DIAGNOSIS — C61 Malignant neoplasm of prostate: Secondary | ICD-10-CM | POA: Diagnosis not present

## 2022-05-30 DIAGNOSIS — N200 Calculus of kidney: Secondary | ICD-10-CM | POA: Diagnosis not present

## 2022-06-25 ENCOUNTER — Ambulatory Visit (INDEPENDENT_AMBULATORY_CARE_PROVIDER_SITE_OTHER): Payer: Medicare Other

## 2022-06-25 DIAGNOSIS — Z23 Encounter for immunization: Secondary | ICD-10-CM

## 2022-07-10 ENCOUNTER — Encounter: Payer: Self-pay | Admitting: Family Medicine

## 2022-07-10 ENCOUNTER — Ambulatory Visit (INDEPENDENT_AMBULATORY_CARE_PROVIDER_SITE_OTHER): Payer: Medicare Other | Admitting: Family Medicine

## 2022-07-10 VITALS — BP 136/69 | HR 72 | Temp 98.2°F | Ht 68.0 in | Wt 195.0 lb

## 2022-07-10 DIAGNOSIS — E782 Mixed hyperlipidemia: Secondary | ICD-10-CM

## 2022-07-10 DIAGNOSIS — E1169 Type 2 diabetes mellitus with other specified complication: Secondary | ICD-10-CM

## 2022-07-10 DIAGNOSIS — I10 Essential (primary) hypertension: Secondary | ICD-10-CM | POA: Diagnosis not present

## 2022-07-10 LAB — BAYER DCA HB A1C WAIVED: HB A1C (BAYER DCA - WAIVED): 6.5 % — ABNORMAL HIGH (ref 4.8–5.6)

## 2022-07-10 NOTE — Progress Notes (Signed)
BP 136/69   Pulse 72   Temp 98.2 F (36.8 C)   Ht '5\' 8"'$  (1.727 m)   Wt 195 lb (88.5 kg)   SpO2 94%   BMI 29.65 kg/m    Subjective:   Patient ID: Paul [name], male    DOB: 1941-05-03, 81 y.o.   MRN: [id number]  HPI: Paul [name] is a 81 y.o. male presenting on 07/10/2022 for Medical Management of Chronic Issues, Diabetes, and Hypertension   HPI Type 2 diabetes mellitus Patient comes in today for recheck of his diabetes. Patient has been currently taking diet control, A1c 6.5. Patient is currently on an ACE inhibitor/ARB. Patient has seen an ophthalmologist this year. Patient denies any issues with their feet. The symptom started onset as an adult hypertension and hyperlipidemia ARE RELATED TO DM   Hyperlipidemia Patient is coming in for recheck of his hyperlipidemia. The patient is currently taking atorvastatin. They deny any issues with myalgias or history of liver damage from it. They deny any focal numbness or weakness or chest pain.   Hypertension Patient is currently on amlodipine and Avapro, and their blood pressure today is 136/69. Patient denies any lightheadedness or dizziness. Patient denies headaches, blurred vision, chest pains, shortness of breath, or weakness. Denies any side effects from medication and is content with current medication.   Patient says his tremors are getting worse for his Parkinson's, has neurology appointment in a couple months.  Instructed to call them if he continues to get worse for adjustments.  Relevant past medical, surgical, family and social history reviewed and updated as indicated. Interim medical history since our last visit reviewed. Allergies and medications reviewed and updated.  Review of Systems  Constitutional:  Negative for chills and fever.  Respiratory:  Negative for shortness of breath and wheezing.   Cardiovascular:  Negative for chest pain and leg swelling.  Musculoskeletal:  Negative for back pain and gait problem.   Skin:  Negative for rash.  Neurological:  Positive for tremors. Negative for dizziness, weakness and light-headedness.  All other systems reviewed and are negative.   Per HPI unless specifically indicated above   Allergies as of 07/10/2022       Reactions   Sulfonamide Derivatives    REACTION: rash        Medication List        Accurate as of July 10, 2022  9:01 AM. If you have any questions, ask your nurse or doctor.          STOP taking these medications    albuterol 108 (90 Base) MCG/ACT inhaler Commonly known as: VENTOLIN HFA Stopped by: Fransisca Kaufmann Lenardo Westwood, MD   guaiFENesin 600 MG 12 hr tablet Commonly known as: [place] by: Fransisca Kaufmann Elena Cothern, MD       TAKE these medications    acetaminophen 500 MG tablet Commonly known as: TYLENOL Take 1,000 mg by mouth every 6 (six) hours as needed.   amLODipine 5 MG tablet Commonly known as: NORVASC Take 1 tablet (5 mg total) by mouth daily.   aspirin 81 MG tablet Take 1 tablet (81 mg total) by mouth daily with breakfast.   atorvastatin 40 MG tablet Commonly known as: LIPITOR Take 1 tablet (40 mg total) by mouth daily.   carbidopa-levodopa 25-100 MG tablet Commonly known as: SINEMET IR Take 1.5 tablets by mouth 3 (three) times daily.   irbesartan 75 MG tablet Commonly known as: AVAPRO Take 1 tablet (75 mg total) by mouth  daily.   pantoprazole 40 MG tablet Commonly known as: PROTONIX Take 1 tablet (40 mg total) by mouth every other day.   Vitamin D3 50 MCG (2000 UT) Tabs Take 2,000 Units by mouth daily.         Objective:   BP 136/69   Pulse 72   Temp 98.2 F (36.8 C)   Ht '5\' 8"'$  (1.727 m)   Wt 195 lb (88.5 kg)   SpO2 94%   BMI 29.65 kg/m   Wt Readings from Last 3 Encounters:  07/10/22 195 lb (88.5 kg)  05/02/22 194 lb 3.2 oz (88.1 kg)  04/11/22 192 lb (87.1 kg)    Physical Exam Vitals and nursing note reviewed.  Constitutional:      General: He is not in acute  distress.    Appearance: He is well-developed. He is not diaphoretic.  Eyes:     General: No scleral icterus.    Conjunctiva/sclera: Conjunctivae normal.  Neck:     Thyroid: No thyromegaly.  Cardiovascular:     Rate and Rhythm: Normal rate and regular rhythm.     Heart sounds: Normal heart sounds. No murmur heard. Pulmonary:     Effort: Pulmonary effort is normal. No respiratory distress.     Breath sounds: Normal breath sounds. No wheezing.  Musculoskeletal:        General: Normal range of motion.     Cervical back: Neck supple.  Lymphadenopathy:     Cervical: No cervical adenopathy.  Skin:    General: Skin is warm and dry.     Findings: No rash.  Neurological:     Mental Status: He is alert and oriented to person, place, and time.     Motor: Tremor (Both hands) present.     Coordination: Coordination normal.  Psychiatric:        Behavior: Behavior normal.       Assessment & Plan:   Problem List Items Addressed This Visit       Cardiovascular and Mediastinum   HTN (hypertension)     Endocrine   Type 2 diabetes mellitus with other specified complication ([place]) - Primary   Relevant Orders   Bayer DCA Hb A1c Waived   Microalbumin / creatinine urine ratio     Other   HLD (hyperlipidemia)    A1c looks good at 6.5.  Continue current treatment.  Blood pressure looks good.  No changes.  Follow-up and talk to neurology if tremors and Parkinson's keeps worsening. Follow up plan: Return in about 3 months (around 10/09/2022), or if symptoms worsen or fail to improve, for Diabetes hypertension and cholesterol.  Counseling provided for all of the vaccine components Orders Placed This Encounter  Procedures   Bayer [place] Hb A1c Waived   Microalbumin / creatinine urine ratio    Caryl Pina, MD [facility] 07/10/2022, 9:01 AM

## 2022-07-31 ENCOUNTER — Ambulatory Visit (INDEPENDENT_AMBULATORY_CARE_PROVIDER_SITE_OTHER): Payer: Medicare Other

## 2022-07-31 VITALS — Ht 68.0 in | Wt 195.0 lb

## 2022-07-31 DIAGNOSIS — Z0000 Encounter for general adult medical examination without abnormal findings: Secondary | ICD-10-CM | POA: Diagnosis not present

## 2022-07-31 NOTE — Progress Notes (Signed)
Subjective:   Paul [name] is a 81 y.o. male who presents for Medicare Annual/Subsequent preventive examination.  I connected with  Paul [name] on 07/31/22 by a audio enabled telemedicine application and verified that I am speaking with the correct person using two identifiers.  Patient Location: Home  Provider Location: Home Office  I discussed the limitations of evaluation and management by telemedicine. The patient expressed understanding and agreed to proceed.  Review of Systems     Cardiac Risk Factors include: advanced age (>67mn, >>55women);diabetes mellitus;dyslipidemia;male gender;hypertension     Objective:    Today's Vitals   07/31/22 1629  Weight: 195 lb (88.5 kg)  Height: _0  (1.727 m)   Body mass index is 29.65 kg/m.     07/31/2022    4:34 PM 05/02/2022    9:06 AM 10/30/2021   10:04 AM 10/06/2021    6:20 PM 10/05/2021    1:03 PM 07/16/2021   10:39 AM 04/18/2021   10:06 AM  Advanced Directives  Does Patient Have a Medical Advance Directive? Yes Yes Yes Yes No Yes Yes  Type of Advance Directive Living will;Healthcare Power of Attorney   Living will  [place] Living [place]  Does patient want to make changes to medical advance directive? No - Patient declined   No - Patient declined     Copy of [place]? No - copy requested     No - copy requested   Would patient like information on creating a medical advance directive?     No - Patient declined      Current Medications (verified) Outpatient Encounter Medications as of 07/31/2022  Medication Sig   acetaminophen (TYLENOL) 500 MG tablet Take 1,000 mg by mouth every 6 (six) hours as needed.   amLODipine (NORVASC) 5 MG tablet Take 1 tablet (5 mg total) by mouth daily.   aspirin 81 MG tablet Take 1 tablet (81 mg total) by mouth daily with breakfast.   atorvastatin (LIPITOR) 40 MG tablet Take 1 tablet (40 mg total) by mouth daily.    carbidopa-levodopa (SINEMET IR) 25-100 MG tablet Take 1.5 tablets by mouth 3 (three) times daily.   Cholecalciferol (VITAMIN D3) 2000 UNITS TABS Take 2,000 Units by mouth daily.   irbesartan (AVAPRO) 75 MG tablet Take 1 tablet (75 mg total) by mouth daily.   pantoprazole (PROTONIX) 40 MG tablet Take 1 tablet (40 mg total) by mouth every other day.   No facility-administered encounter medications on file as of 07/31/2022.    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; s/p prostatectomy; involving both lobes; No extraprostatic involvement; neg margin;  pT2c, pN0, M0    Diabetes mellitus without complication (HCC)    Full dentures    does not wear bottoms   GERD (gastroesophageal reflux disease)    Hyperlipidemia    Hypertension    Iron deficiency anemia    presumped malabsorption; neg EGD/ capsule endos/colosopcy   Iron deficiency anemia, unspecified 10/16/2012   Kidney stones    Parkinson's disease    Past Surgical History:  Procedure Laterality Date   COLONOSCOPY     HEMORRHOID SURGERY N/A 07/05/2014   Procedure: HEMORRHOIDECTOMY;  Surgeon: TErroll Luna MD;  Location: [facility]  Service: General;  Laterality: N/A;   ORIF RADIAL FRACTURE     right-as child   robtic  assisted laparoscopic     radical prostatectomy and bilateral pelvic lymphadenectomy   UPPER GI ENDOSCOPY     Family History  Problem Relation Age of Onset   Heart failure Mother    Diabetes type II Mother    Diabetes Mother    Heart attack Father    Diabetes Brother    Diabetes Sister    Cancer Sister        colon   Diabetes Sister    Diabetes Brother    Cancer Brother        bone and leukemia   Diabetes Brother    Diabetes Brother    Cancer Brother    Healthy Son        over [employer]    Social History   Socioeconomic History   Marital status: Married    Spouse name: Not on  file   Number of children: 1   Years of education: 10   Highest education level: 10th grade  Occupational History   Occupation: retired    Comment: Architect work  Tobacco Use   Smoking status: Former    Packs/day: 1.00    Years: 25.00    Total pack years: 25.00    Types: Cigarettes    Quit date: 09/18/1981    Years since quitting: 40.8   Smokeless tobacco: Never  Vaping Use   Vaping Use: Never used  Substance and Sexual Activity   Alcohol use: No   Drug use: No   Sexual activity: Yes  Other Topics Concern   Not on file  Social History Narrative   06/28/2019 Mr Cafaro lives at home with his wife of 30 years. 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 His son and 3 grandchildren live down the road. They still see them often.    Social Determinants of Health   Financial Resource Strain: Low Risk  (07/31/2022)   Overall Financial Resource Strain (CARDIA)    Difficulty of Paying Living Expenses: Not hard at all  Food Insecurity: No Food Insecurity (07/31/2022)   Hunger Vital Sign    Worried About Running Out of Food in the Last Year: Never true    Ran Out of Food in the Last Year: Never true  Transportation Needs: No Transportation Needs (07/31/2022)   PRAPARE - Hydrologist (Medical): No    Lack of Transportation (Non-Medical): No  Physical Activity: Inactive (07/31/2022)   Exercise Vital Sign    Days of Exercise per Week: 0 days    Minutes of Exercise per Session: 0 min  Stress: No Stress Concern Present (07/31/2022)   [place]    Feeling of Stress : Not at all  Social Connections: Moderately Integrated (07/31/2022)   Social Connection and Isolation Panel [NHANES]    Frequency of Communication with Friends and Family: More than three times a week    Frequency of Social Gatherings with Friends and Family: Three times a week     Attends Religious Services: More than 4 times per year    Active Member of Clubs or Organizations: No    Attends Archivist Meetings: Never    Marital Status: Married    Tobacco Counseling Counseling given: Not Answered   Clinical Intake:  Pre-visit preparation completed: Yes  Pain : No/denies pain  Diabetes: Yes CBG done?: No Did pt. bring in CBG monitor from home?: No  How often do you need to have someone help you when you read instructions, pamphlets, or other written materials from your doctor or pharmacy?: 1 - Never  Diabetic?Yes Nutrition Risk Assessment:  Has the patient had any N/V/D within the last 2 months?  No  Does the patient have any non-healing wounds?  No  Has the patient had any unintentional weight loss or weight gain?  No   Diabetes:  Is the patient diabetic?  Yes  If diabetic, was a CBG obtained today?  No  Did the patient bring in their glucometer from home?  No  How often do you monitor your CBG's? As directed .   Financial Strains and Diabetes Management:  Are you having any financial strains with the device, your supplies or your medication? No .  Does the patient want to be seen by Chronic Care Management for management of their diabetes?  No  Would the patient like to be referred to a Nutritionist or for Diabetic Management?  No   Diabetic Exams:  Diabetic Eye Exam: Overdue for diabetic eye exam. Pt has been advised about the importance in completing this exam. Patient advised to call and schedule an eye exam. Diabetic Foot Exam: Completed 01/04/22   Interpreter Needed?: No  Information entered by :: Denman George LPN   Activities of Daily Living    07/31/2022    4:35 PM 10/06/2021    6:20 PM  In your present state of health, do you have any difficulty performing the following activities:  Hearing? 0 1  Vision? 0 0  Difficulty concentrating or making decisions? 0 0  Walking or climbing stairs? 0 0  Dressing or  bathing? 0 0  Doing errands, shopping? 0 0  Preparing Food and eating ? N   Using the Toilet? N   In the past six months, have you accidently leaked urine? N   Do you have problems with loss of bowel control? N   Managing your Medications? N   Managing your Finances? N   Housekeeping or managing your Housekeeping? N     Patient Care Team: Dettinger, Fransisca Kaufmann, MD as PCP - General (Family Medicine) Elease Etienne as Physician Assistant (Physician Assistant) Chipper Herb, MD (Inactive) as Attending Physician (Family Medicine) Myrlene Broker, MD as Attending Physician (Urology) Tat, Eustace Quail, DO as Consulting Physician (Neurology)  Indicate any recent Medical Services you may have received from other than [facility] providers in the past year (date may be approximate).     Assessment:   This is a routine wellness examination for Jiles.  Hearing/Vision screen Hearing Screening - Comments:: Denies hearing difficulties   Vision Screening - Comments::  up to date with routine eye exams   Dietary issues and exercise activities discussed: Current Exercise Habits: The patient does not participate in regular exercise at present   Goals Addressed   None   Depression Screen    07/31/2022    4:31 PM 07/10/2022    8:40 AM 04/11/2022   10:58 AM 01/04/2022    9:17 AM 12/06/2021    4:03 PM 10/31/2021    9:10 AM 10/17/2021   11:07 AM  PHQ 2/9 Scores  PHQ - 2 Score 0 0 0 0 0 0 0  PHQ- 9 Score  0  0       Fall Risk    07/31/2022    4:31 PM 07/10/2022    8:40 AM 05/02/2022    9:06 AM 04/11/2022  10:58 AM 01/04/2022    9:17 AM  Fall Risk   Falls in the past year? 0 0 0 0 0  Number falls in past yr: 0      Injury with Fall? 0      Risk for fall due to : No Fall Risks      Follow up Falls evaluation completed;Education provided;Falls prevention discussed        FALL RISK PREVENTION PERTAINING TO THE HOME:  Any stairs in or around the home? Yes  If so, are there any without  handrails? No  Home free of loose throw rugs in walkways, pet beds, electrical cords, etc? Yes  Adequate lighting in your home to reduce risk of falls? Yes   ASSISTIVE DEVICES UTILIZED TO PREVENT FALLS:  Life alert? No  Use of a cane, walker or w/c? No  Grab bars in the bathroom? Yes  Shower chair or bench in shower? No  Elevated toilet seat or a handicapped toilet? Yes   TIMED UP AND GO:  Was the test performed? No . Telephonic visit   Cognitive Function:    05/26/2017    3:31 PM  MMSE - Mini Mental State Exam  Orientation to time 4  Orientation to Place 5  Registration 3  Attention/ Calculation 5  Recall 2  Language- name 2 objects 2  Language- repeat 1  Language- follow 3 step command 3  Language- read & follow direction 1  Write a sentence 1  Copy design 1  Total score 28        07/31/2022    4:35 PM 07/16/2021   10:43 AM 07/14/2020    3:01 PM 06/28/2019    1:28 PM 06/26/2018   11:47 AM  6CIT Screen  What Year? 0 points 0 points 0 points 0 points 0 points  What month? 0 points 0 points 0 points 0 points 0 points  What time? 0 points 0 points 0 points 0 points 0 points  Count back from 20 0 points 0 points 0 points 0 points 0 points  Months in reverse 0 points 0 points 0 points 0 points 0 points  Repeat phrase 0 points 0 points 0 points 0 points 6 points  Total Score 0 points 0 points 0 points 0 points 6 points    Immunizations Immunization History  Administered Date(s) Administered   Fluad Quad(high Dose 65+) 06/01/2019, 05/17/2020, 05/30/2021, 06/25/2022   Influenza, High Dose Seasonal PF 05/22/2016, 05/21/2017, 05/21/2018   Influenza,inj,Quad PF,6+ Mos 05/10/2013, 05/05/2014, 05/11/2015   Moderna SARS-COV2 Booster Vaccination 05/23/2021   Moderna Sars-Covid-2 Vaccination 09/02/2018, 09/25/2019, 05/31/2020   Pneumococcal Conjugate-13 07/23/2013   Pneumococcal Polysaccharide-23 11/20/2016   Tdap 11/23/2013   Zoster Recombinat (Shingrix) 06/26/2018,  09/04/2018    TDAP status: Up to date  Flu Vaccine status: Up to date  Pneumococcal vaccine status: Up to date  Covid-19 vaccine status: Information provided on how to obtain vaccines.   Qualifies for Shingles Vaccine? Yes   Zostavax completed No   Shingrix Completed?: Yes  Screening Tests Health Maintenance  Topic Date Due   Diabetic kidney evaluation - Urine ACR  09/20/2021   COVID-19 Vaccine (4 - 2023-24 season) 04/05/2022   OPHTHALMOLOGY EXAM  10/14/2022 (Originally 12/21/2020)   FOOT EXAM  01/05/2023   HEMOGLOBIN A1C  01/09/2023   Diabetic kidney evaluation - eGFR measurement  04/12/2023   Medicare Annual Wellness (AWV)  08/01/2023   DTaP/Tdap/Td (2 - Td or Tdap) 11/24/2023   Pneumonia Vaccine 65+  Years old  Completed   INFLUENZA VACCINE  Completed   Zoster Vaccines- Shingrix  Completed   HPV VACCINES  Aged Out    Health Maintenance  Health Maintenance Due  Topic Date Due   Diabetic kidney evaluation - Urine ACR  09/20/2021   COVID-19 Vaccine (4 - 2023-24 season) 04/05/2022    Colorectal cancer screening: No longer required.   Lung Cancer Screening: (Low Dose CT Chest recommended if Age 64-80 years, 30 pack-year currently smoking OR have quit w/in 15years.) does not qualify.   Lung Cancer Screening Referral: n/a  Additional Screening:  Hepatitis C Screening: does not qualify  Vision Screening: Recommended annual ophthalmology exams for early detection of glaucoma and other disorders of the eye. Is the patient up to date with their annual eye exam?  No  Who is the provider or what is the name of the office in which the patient attends annual eye exams? Plans to establish in the upcoming year  If pt is not established with a provider, would they like to be referred to a provider to establish care? No .   Dental Screening: Recommended annual dental exams for proper oral hygiene  Community Resource Referral / Chronic Care Management: CRR required this visit?   No   CCM required this visit?  No      Plan:     I have personally reviewed and noted the following in the patient's chart:   Medical and social history Use of alcohol, tobacco or illicit drugs  Current medications and supplements including opioid prescriptions. Patient is not currently taking opioid prescriptions. Functional ability and status Nutritional status Physical activity Advanced directives List of other physicians Hospitalizations, surgeries, and ER visits in previous 12 months Vitals Screenings to include cognitive, depression, and falls Referrals and appointments  In addition, I have reviewed and discussed with patient certain preventive protocols, quality metrics, and best practice recommendations. A written personalized care plan for preventive services as well as general preventive health recommendations were provided to patient.     Vanetta Mulders, [place]   11/57/2620   Due to this being a virtual visit, the after visit summary with patients personalized plan was offered to patient via mail or my-chart. Patient would like to access on my-chart  Nurse Notes: No concerns

## 2022-07-31 NOTE — Patient Instructions (Signed)
Paul [name] , Thank you for taking time to come for your Medicare Wellness Visit. I appreciate your ongoing commitment to your health goals. Please review the following plan we discussed and let me know if I can assist you in the future.   These are the goals we discussed:  Goals      AWV     06/28/2019 AWV Goal: Fall Prevention  Over the next year, patient will decrease their risk for falls by: Using assistive devices, such as a cane or walker, as needed Identifying fall risks within their home and correcting them by: Removing throw rugs Adding handrails to stairs or ramps Removing clutter and keeping a clear pathway throughout the home Increasing light, especially at night Adding shower handles/bars Raising toilet seat Identifying potential personal risk factors for falls: Medication side effects Incontinence/urgency Vestibular dysfunction Hearing loss Musculoskeletal disorders Neurological disorders Orthostatic hypotension       Exercise 150 minutes per week (moderate activity)     07/14/2020 AWV Goal: Exercise for General Health  Patient will verbalize understanding of the benefits of increased physical activity: Exercising regularly is important. It will improve your overall fitness, flexibility, and endurance. Regular exercise also will improve your overall health. It can help you control your weight, reduce stress, and improve your bone density. Over the next year, patient will increase physical activity as tolerated with a goal of at least 150 minutes of moderate physical activity per week.  You can tell that you are exercising at a moderate intensity if your heart starts beating faster and you start breathing faster but can still hold a conversation. Moderate-intensity exercise ideas include: Walking 1 mile (1.6 km) in about 15 minutes Biking Hiking Golfing Dancing Water aerobics Patient will verbalize understanding of everyday activities that increase physical  activity by providing examples like the following: Yard work, such as: Sales promotion account executive Gardening Washing windows or floors Patient will be able to explain general safety guidelines for exercising:  Before you start a new exercise program, talk with your health care provider. Do not exercise so much that you hurt yourself, feel dizzy, or get very short of breath. Wear comfortable clothes and wear shoes with good support. Drink plenty of water while you exercise to prevent dehydration or heat stroke. Work out until your breathing and your heartbeat get faster.      Weight (lb) < 190 lb (86.2 kg)        This is a list of the screening recommended for you and due dates:  Health Maintenance  Topic Date Due   Yearly kidney health urinalysis for diabetes  09/20/2021   COVID-19 Vaccine (4 - 2023-24 season) 04/05/2022   Eye exam for diabetics  10/14/2022*   Complete foot exam   01/05/2023   Hemoglobin A1C  01/09/2023   Yearly kidney function blood test for diabetes  04/12/2023   Medicare Annual Wellness Visit  08/01/2023   DTaP/Tdap/Td vaccine (2 - Td or Tdap) 11/24/2023   Pneumonia Vaccine  Completed   Flu Shot  Completed   Zoster (Shingles) Vaccine  Completed   HPV Vaccine  Aged Out  *Topic was postponed. The date shown is not the original due date.    Advanced directives: Please bring a copy of your health care power of attorney and living will to the office to be added to your chart at your convenience.   Conditions/risks identified: Aim for 30  minutes of exercise or brisk walking, 6-8 glasses of water, and 5 servings of fruits and vegetables each day.   Next appointment: Follow up in one year for your annual wellness visit.   Preventive Care 86 Years and Older, Male  Preventive care refers to lifestyle choices and visits with your health care provider that can promote health and wellness. What  does preventive care include? A yearly physical exam. This is also called an annual well check. Dental exams once or twice a year. Routine eye exams. Ask your health care provider how often you should have your eyes checked. Personal lifestyle choices, including: Daily care of your teeth and gums. Regular physical activity. Eating a healthy diet. Avoiding tobacco and drug use. Limiting alcohol use. Practicing safe sex. Taking low doses of aspirin every day. Taking vitamin and mineral supplements as recommended by your health care provider. What happens during an annual well check? The services and screenings done by your health care provider during your annual well check will depend on your age, overall health, lifestyle risk factors, and family history of disease. Counseling  Your health care provider may ask you questions about your: Alcohol use. Tobacco use. Drug use. Emotional well-being. Home and relationship well-being. Sexual activity. Eating habits. History of falls. Memory and ability to understand (cognition). Work and work Statistician. Screening  You may have the following tests or measurements: Height, weight, and BMI. Blood pressure. Lipid and cholesterol levels. These may be checked every 5 years, or more frequently if you are over 95 years old. Skin check. Lung cancer screening. You may have this screening every year starting at age 65 if you have a 30-pack-year history of smoking and currently smoke or have quit within the past 15 years. Fecal occult blood test (FOBT) of the stool. You may have this test every year starting at age 58. Flexible sigmoidoscopy or colonoscopy. You may have a sigmoidoscopy every 5 years or a colonoscopy every 10 years starting at age 50. Prostate cancer screening. Recommendations will vary depending on your family history and other risks. Hepatitis C blood test. Hepatitis B blood test. Sexually transmitted disease (STD)  testing. Diabetes screening. This is done by checking your blood sugar (glucose) after you have not eaten for a while (fasting). You may have this done every 1-3 years. Abdominal aortic aneurysm (AAA) screening. You may need this if you are a current or former smoker. Osteoporosis. You may be screened starting at age 34 if you are at high risk. Talk with your health care provider about your test results, treatment options, and if necessary, the need for more tests. Vaccines  Your health care provider may recommend certain vaccines, such as: Influenza vaccine. This is recommended every year. Tetanus, diphtheria, and acellular pertussis (Tdap, Td) vaccine. You may need a Td booster every 10 years. Zoster vaccine. You may need this after age 70. Pneumococcal 13-valent conjugate (PCV13) vaccine. One dose is recommended after age 48. Pneumococcal polysaccharide (PPSV23) vaccine. One dose is recommended after age 58. Talk to your health care provider about which screenings and vaccines you need and how often you need them. This information is not intended to replace advice given to you by your health care provider. Make sure you discuss any questions you have with your health care provider. Document Released: 08/18/2015 Document Revised: 04/10/2016 Document Reviewed: 05/23/2015 Elsevier Interactive Patient Education  2017 [employer] Prevention in the Home Falls can cause injuries. They can happen to people of all  ages. There are many things you can do to make your home safe and to help prevent falls. What can I do on the outside of my home? Regularly fix the edges of walkways and driveways and fix any cracks. Remove anything that might make you trip as you walk through a door, such as a raised step or threshold. Trim any bushes or trees on the path to your home. Use bright outdoor lighting. Clear any walking paths of anything that might make someone trip, such as rocks or  tools. Regularly check to see if handrails are loose or broken. Make sure that both sides of any steps have handrails. Any raised decks and porches should have guardrails on the edges. Have any leaves, snow, or ice cleared regularly. Use sand or salt on walking paths during winter. Clean up any spills in your garage right away. This includes oil or grease spills. What can I do in the bathroom? Use night lights. Install grab bars by the toilet and in the tub and shower. Do not use towel bars as grab bars. Use non-skid mats or decals in the tub or shower. If you need to sit down in the shower, use a plastic, non-slip stool. Keep the floor dry. Clean up any water that spills on the floor as soon as it happens. Remove soap buildup in the tub or shower regularly. Attach bath mats securely with double-sided non-slip rug tape. Do not have throw rugs and other things on the floor that can make you trip. What can I do in the bedroom? Use night lights. Make sure that you have a light by your bed that is easy to reach. Do not use any sheets or blankets that are too big for your bed. They should not hang down onto the floor. Have a firm chair that has side arms. You can use this for support while you get dressed. Do not have throw rugs and other things on the floor that can make you trip. What can I do in the kitchen? Clean up any spills right away. Avoid walking on wet floors. Keep items that you use a lot in easy-to-reach places. If you need to reach something above you, use a strong step stool that has a grab bar. Keep electrical cords out of the way. Do not use floor polish or wax that makes floors slippery. If you must use wax, use non-skid floor wax. Do not have throw rugs and other things on the floor that can make you trip. What can I do with my stairs? Do not leave any items on the stairs. Make sure that there are handrails on both sides of the stairs and use them. Fix handrails that are  broken or loose. Make sure that handrails are as long as the stairways. Check any carpeting to make sure that it is firmly attached to the stairs. Fix any carpet that is loose or worn. Avoid having throw rugs at the top or bottom of the stairs. If you do have throw rugs, attach them to the floor with carpet tape. Make sure that you have a light switch at the top of the stairs and the bottom of the stairs. If you do not have them, ask someone to add them for you. What else can I do to help prevent falls? Wear shoes that: Do not have high heels. Have rubber bottoms. Are comfortable and fit you well. Are closed at the toe. Do not wear sandals. If you use a stepladder:  Make sure that it is fully opened. Do not climb a closed stepladder. Make sure that both sides of the stepladder are locked into place. Ask someone to hold it for you, if possible. Clearly mark and make sure that you can see: Any grab bars or handrails. First and last steps. Where the edge of each step is. Use tools that help you move around (mobility aids) if they are needed. These include: Canes. Walkers. Scooters. Crutches. Turn on the lights when you go into a dark area. Replace any light bulbs as soon as they burn out. Set up your furniture so you have a clear path. Avoid moving your furniture around. If any of your floors are uneven, fix them. If there are any pets around you, be aware of where they are. Review your medicines with your doctor. Some medicines can make you feel dizzy. This can increase your chance of falling. Ask your doctor what other things that you can do to help prevent falls. This information is not intended to replace advice given to you by your health care provider. Make sure you discuss any questions you have with your health care provider. Document Released: 05/18/2009 Document Revised: 12/28/2015 Document Reviewed: 08/26/2014 Elsevier Interactive Patient Education  2017 [employer].

## 2022-10-10 ENCOUNTER — Encounter: Payer: Self-pay | Admitting: Family Medicine

## 2022-10-10 ENCOUNTER — Ambulatory Visit (INDEPENDENT_AMBULATORY_CARE_PROVIDER_SITE_OTHER): Payer: Medicare Other | Admitting: Family Medicine

## 2022-10-10 VITALS — Ht 68.0 in

## 2022-10-10 DIAGNOSIS — I152 Hypertension secondary to endocrine disorders: Secondary | ICD-10-CM | POA: Diagnosis not present

## 2022-10-10 DIAGNOSIS — I10 Essential (primary) hypertension: Secondary | ICD-10-CM | POA: Diagnosis not present

## 2022-10-10 DIAGNOSIS — E1169 Type 2 diabetes mellitus with other specified complication: Secondary | ICD-10-CM

## 2022-10-10 DIAGNOSIS — E782 Mixed hyperlipidemia: Secondary | ICD-10-CM

## 2022-10-10 DIAGNOSIS — E1159 Type 2 diabetes mellitus with other circulatory complications: Secondary | ICD-10-CM

## 2022-10-10 DIAGNOSIS — R0602 Shortness of breath: Secondary | ICD-10-CM

## 2022-10-10 DIAGNOSIS — K219 Gastro-esophageal reflux disease without esophagitis: Secondary | ICD-10-CM

## 2022-10-10 LAB — BAYER DCA HB A1C WAIVED: HB A1C (BAYER DCA - WAIVED): 6.3 % — ABNORMAL HIGH (ref 4.8–5.6)

## 2022-10-10 LAB — LIPID PANEL

## 2022-10-10 MED ORDER — ALBUTEROL SULFATE HFA 108 (90 BASE) MCG/ACT IN AERS: 2.0000 | INHALATION_SPRAY | Freq: Four times a day (QID) | RESPIRATORY_TRACT | 1 refills | Status: AC | PRN

## 2022-10-10 MED ORDER — PANTOPRAZOLE SODIUM 40 MG PO TBEC: 40.0000 mg | DELAYED_RELEASE_TABLET | ORAL | 3 refills | Status: DC

## 2022-10-10 NOTE — Progress Notes (Signed)
Ht '5\' 8"'$  (1.727 m)   BMI 29.65 kg/m    Subjective:   Patient ID: Paul [name], male    DOB: May 16, 1941, 82 y.o.   MRN: [id number]  HPI: Paul [name] is a 82 y.o. male presenting on 10/10/2022 for Medical Management of Chronic Issues, Diabetes, and Hypertension  Type 2 diabetes mellitus Patient comes in today for recheck of their Type 2 Diabetes Mellitus. Patient is currently only using diet control. Patient is currently taking an ACE inhibitor/ARB. Patient has seen an ophthalmologist in the last year. Patient's last ophthalmology exam was October 2023. Patient does not see a podiatrist. Patient denies any issues with their feet and does check them at home when he showers. The symptom started onset as an adult. Hypertension and hyperlipidemia are related to Type 2 Diabetes Mellitus.    Hypertension Patient is coming in for a recheck of their hypertension. Patient is currently taking Amlodipine and Irbesartan. Patient's blood pressure today is 135/70. Patient has been taking their blood pressure at home. Average blood pressure readings at home are 120s/70s. Patient denies lightheadedness, dizziness, weakness, headaches, blurred vision, chest pains, and shortness of breath. Patient denies any side effects from medication(s) and is content with current medication(s).   Hyperlipidemia Patient is coming in for recheck of his hyperlipidemia. The patient is currently taking Atorvastatin. They deny any issues with myalgias or history of liver damage from it. They deny any focal numbness or weakness or chest pain.   Patient has diagnosed Parkinson's disease. Patient states his tremors have been manageable, worsening slightly since last visit, but does not need an increase in medications. Patient has been using bike pedals to stay active and maintain his strength.  Patient is requesting an as-needed albuterol inhaler for when he gets short of breath at home. This has been helpful in the past for  his symptoms.  Relevant past medical, surgical, family and social history were reviewed and updated as indicated. Interim medical history were reviewed. Allergies and medications were reviewed and updated.  Review of Systems  Constitutional:  Negative for chills and fever.  HENT:  Negative for congestion, rhinorrhea and sore throat.   Eyes:  Negative for visual disturbance.  Cardiovascular:  Negative for chest pain and leg swelling.  Gastrointestinal:  Negative for abdominal pain.  Endocrine: Positive for polydipsia. Negative for polyphagia and polyuria.  Genitourinary:  Negative for difficulty urinating and dysuria.  Musculoskeletal:  Negative for back pain, gait problem and myalgias.  Skin:  Negative for rash.  Neurological:  Positive for tremors. Negative for dizziness, weakness, light-headedness, numbness and headaches.  Psychiatric/Behavioral:  Negative for behavioral problems, confusion and sleep disturbance.   All other systems reviewed and are negative.   Per HPI unless specifically indicated above.   Allergies as of 10/10/2022       Reactions   Sulfonamide Derivatives    REACTION: rash        Medication List        Accurate as of October 10, 2022  9:23 AM. If you have any questions, ask your nurse or doctor.          acetaminophen 500 MG tablet Commonly known as: TYLENOL Take 1,000 mg by mouth every 6 (six) hours as needed.   albuterol 108 (90 Base) MCG/ACT inhaler Commonly known as: VENTOLIN HFA Inhale 2 puffs into the lungs every 6 (six) hours as needed for wheezing or shortness of breath. Started by: Worthy Rancher, MD   amLODipine 5  MG tablet Commonly known as: NORVASC Take 1 tablet (5 mg total) by mouth daily.   aspirin 81 MG tablet Take 1 tablet (81 mg total) by mouth daily with breakfast.   atorvastatin 40 MG tablet Commonly known as: LIPITOR Take 1 tablet (40 mg total) by mouth daily.   carbidopa-levodopa 25-100 MG tablet Commonly known  as: SINEMET IR Take 1.5 tablets by mouth 3 (three) times daily.   irbesartan 75 MG tablet Commonly known as: AVAPRO Take 1 tablet (75 mg total) by mouth daily.   pantoprazole 40 MG tablet Commonly known as: PROTONIX Take 1 tablet (40 mg total) by mouth every other day.   Vitamin D3 50 MCG (2000 UT) Tabs Generic drug: Cholecalciferol Take 2,000 Units by mouth daily.         Objective:   Ht 5\' 8"  (1.727 m)   BMI 29.65 kg/m   Wt Readings from Last 3 Encounters:  07/31/22 88.5 kg  07/10/22 88.5 kg  05/02/22 88.1 kg    Physical Exam Vitals reviewed.  Constitutional:      General: He is not in acute distress.    Appearance: Normal appearance. He is not diaphoretic.  Eyes:     General: No scleral icterus.    Conjunctiva/sclera: Conjunctivae normal.  Neck:     Thyroid: No thyromegaly.  Cardiovascular:     Rate and Rhythm: Normal rate and regular rhythm.     Pulses: Normal pulses.     Heart sounds: Normal heart sounds.  Pulmonary:     Effort: Pulmonary effort is normal.     Breath sounds: Normal breath sounds. No wheezing, rhonchi or rales.  Abdominal:     Tenderness: There is no right CVA tenderness or left CVA tenderness.  Musculoskeletal:     Cervical back: Neck supple. No tenderness.     Right lower leg: No edema.     Left lower leg: No edema.  Lymphadenopathy:     Cervical: No cervical adenopathy.  Skin:    General: Skin is warm and dry.  Neurological:     Mental Status: He is alert and oriented to person, place, and time.     Gait: Gait normal.  Psychiatric:        Mood and Affect: Mood normal.        Behavior: Behavior normal.       Assessment & Plan:   Problem List Items Addressed This Visit       Cardiovascular and Mediastinum   HTN (hypertension)   Relevant Orders   CBC with Differential/Platelet (Completed)   CMP14+EGFR (Completed)   Lipid panel (Completed)   Bayer DCA Hb A1c Waived (Completed)     Digestive   GERD (gastroesophageal  reflux disease)   Relevant Medications   pantoprazole (PROTONIX) 40 MG tablet     Endocrine   Type 2 diabetes mellitus with other specified complication ([place]) - Primary   Relevant Orders   CBC with Differential/Platelet (Completed)   CMP14+EGFR (Completed)   Lipid panel (Completed)   Bayer DCA Hb A1c Waived (Completed)     Other   HLD (hyperlipidemia)   Relevant Orders   CBC with Differential/Platelet (Completed)   CMP14+EGFR (Completed)   Lipid panel (Completed)   Bayer DCA Hb A1c Waived (Completed)   Other Visit Diagnoses     Shortness of breath       Relevant Medications   albuterol (VENTOLIN HFA) 108 (90 Base) MCG/ACT inhaler       HbA1c is 6.5 today.  Continue dietary control only.  Patient was given albuterol inhaler to use as needed for shortness of breath for reactive airways.  Follow up plan: Return in about 3 months (around 01/10/2023), or if symptoms worsen or fail to improve, for Diabetes recheck.  Counseling provided for all of the vaccine components Orders Placed This Encounter  Procedures   CBC with Differential/Platelet   CMP14+EGFR   Lipid panel   Bayer [facility] Hb A1c [address] PA-S2 [facility] 10/10/2022, 9:23 AM  I was personally present for all components of the history, physical exam and/or medical decision making.  I agree with the documentation performed by the PA student and agree with assessment and plan above.  PA student Vonna Kotyk Shaquasia Caponigro [facility] 10/10/2022, 9:23 AM

## 2022-10-11 LAB — CMP14+EGFR
ALT: 18 IU/L (ref 0–44)
AST: 25 IU/L (ref 0–40)
Albumin/Globulin Ratio: 1.5 (ref 1.2–2.2)
Albumin: 4.3 g/dL (ref 3.7–4.7)
Alkaline Phosphatase: 113 IU/L (ref 44–121)
BUN/Creatinine Ratio: 21 (ref 10–24)
BUN: 19 mg/dL (ref 8–27)
Bilirubin Total: 0.8 mg/dL (ref 0.0–1.2)
CO2: 22 mmol/L (ref 20–29)
Calcium: 9.6 mg/dL (ref 8.6–10.2)
Chloride: 102 mmol/L (ref 96–106)
Creatinine, Ser: 0.9 mg/dL (ref 0.76–1.27)
Globulin, Total: 2.8 g/dL (ref 1.5–4.5)
Glucose: 117 mg/dL — ABNORMAL HIGH (ref 70–99)
Potassium: 4.6 mmol/L (ref 3.5–5.2)
Sodium: 140 mmol/L (ref 134–144)
Total Protein: 7.1 g/dL (ref 6.0–8.5)
eGFR: 86 mL/min/{1.73_m2} (ref >59)

## 2022-10-11 LAB — LIPID PANEL
Chol/HDL Ratio: 2.5 ratio (ref 0.0–5.0)
Cholesterol, Total: 144 mg/dL (ref 100–199)
HDL: 57 mg/dL (ref >39)
LDL Chol Calc (NIH): 73 mg/dL (ref 0–99)
Triglycerides: 73 mg/dL (ref 0–149)
VLDL Cholesterol Cal: 14 mg/dL (ref 5–40)

## 2022-10-11 LAB — CBC WITH DIFFERENTIAL/PLATELET
Basophils Absolute: 0 10*3/uL (ref 0.0–0.2)
Basos: 1 %
EOS (ABSOLUTE): 0.2 10*3/uL (ref 0.0–0.4)
Eos: 3 %
Hematocrit: 41.4 % (ref 37.5–51.0)
Hemoglobin: 13.7 g/dL (ref 13.0–17.7)
Immature Grans (Abs): 0 10*3/uL (ref 0.0–0.1)
Immature Granulocytes: 0 %
Lymphocytes Absolute: 2.9 10*3/uL (ref 0.7–3.1)
Lymphs: 45 %
MCH: 32.7 pg (ref 26.6–33.0)
MCHC: 33.1 g/dL (ref 31.5–35.7)
MCV: 99 fL — ABNORMAL HIGH (ref 79–97)
Monocytes Absolute: 0.8 10*3/uL (ref 0.1–0.9)
Monocytes: 12 %
Neutrophils Absolute: 2.5 10*3/uL (ref 1.4–7.0)
Neutrophils: 39 %
Platelets: 307 10*3/uL (ref 150–450)
RBC: 4.19 x10E6/uL (ref 4.14–5.80)
RDW: 13.2 % (ref 11.6–15.4)
WBC: 6.4 10*3/uL (ref 3.4–10.8)

## 2022-11-05 NOTE — Progress Notes (Unsigned)
Assessment/Plan:   1.  Parkinsons Disease  -continue carbidopa/levodopa 25/100, 1.5 tablet at 8 AM/noon/4 PM.    -needs to increase exercise  -We discussed that it used to be thought that levodopa would increase risk of melanoma but now it is believed that Parkinsons itself likely increases risk of melanoma. he is to get regular skin checks.  Names given today     Subjective:   Paul [name] was seen today in follow up for Parkinsons disease.  My previous records were reviewed prior to todays visit as well as outside records available to me. Pt with wife who supplements hx. he saw primary care March 7 and those notes are reviewed.  The patient has not had any falls.  No near syncope.  No hallucinations.  Current prescribed movement disorder medications: Carbidopa/levodopa 25/100, 1.5 tablet 3 times per day.   ALLERGIES:   Allergies  Allergen Reactions   Sulfonamide Derivatives     REACTION: rash    CURRENT MEDICATIONS:  Outpatient Encounter Medications as of 05/02/2022  Medication Sig   acetaminophen (TYLENOL) 500 MG tablet Take 1,000 mg by mouth every 6 (six) hours as needed.   amLODipine (NORVASC) 5 MG tablet Take 1 tablet (5 mg total) by mouth daily.   aspirin 81 MG tablet Take 1 tablet (81 mg total) by mouth daily with breakfast.   atorvastatin (LIPITOR) 40 MG tablet Take 1 tablet (40 mg total) by mouth daily.   carbidopa-levodopa (SINEMET IR) 25-100 MG tablet Take 1.5 tablets by mouth 3 (three) times daily.   Cholecalciferol (VITAMIN D3) 2000 UNITS TABS Take 2,000 Units by mouth daily.   irbesartan (AVAPRO) 75 MG tablet Take 1 tablet (75 mg total) by mouth daily.   pantoprazole (PROTONIX) 40 MG tablet Take 1 tablet (40 mg total) by mouth every other day.   albuterol (VENTOLIN HFA) 108 (90 Base) MCG/ACT inhaler Inhale 2 puffs into the lungs every 6 (six) hours as needed for wheezing or shortness of breath. (Patient not taking: Reported on 05/02/2022)   guaiFENesin  (MUCINEX) 600 MG 12 hr tablet Take by mouth 2 (two) times daily. (Patient not taking: Reported on 05/02/2022)   No facility-administered encounter medications on file as of 05/02/2022.    Objective:   PHYSICAL EXAMINATION:    VITALS:   Vitals:   05/02/22 0906  BP: 118/72  Pulse: 64  SpO2: 95%  Weight: 194 lb 3.2 oz (88.1 kg)  Height: 5\' 8"  (1.727 m)      GEN:  The patient appears stated age and is in NAD. HEENT:  Normocephalic, atraumatic.  The mucous membranes are moist. The superficial temporal arteries are without ropiness or tenderness.   Neurological examination:  Orientation: The patient is alert and oriented x3. Cranial nerves: There is good facial symmetry with facial hypomimia. The speech is fluent and clear.  Hearing is intact to conversational tone. Sensation: Sensation is intact to light touch throughout Motor: Strength is at least antigravity x4.   Movement examination: Tone: There is mild increased tone in the RUE Abnormal movements: there is RUE rest tremor, overall mild. Coordination:  There is no significant decremation with RAM's, with any form of RAMS, including alternating supination and pronation of the forearm, hand opening and closing, finger taps, heel taps and toe taps. Gait and Station: The patient has min difficulty arising out of a deep-seated chair without the use of the hands. The patient's stride length is good.  He has almost no arm swing on  the right.  He slightly drags the R leg  I have reviewed and interpreted the following labs independently    Chemistry      Component Value Date/Time   NA 140 04/11/2022 1047   NA 141 04/06/2013 0956   K 4.5 04/11/2022 1047   K 4.1 04/06/2013 0956   CL 100 04/11/2022 1047   CL 106 10/16/2012 1022   CO2 22 04/11/2022 1047   CO2 24 04/06/2013 0956   BUN 11 04/11/2022 1047   BUN 16.7 04/06/2013 0956   CREATININE 0.90 04/11/2022 1047   CREATININE 1.0 04/06/2013 0956      Component Value Date/Time    CALCIUM 9.8 04/11/2022 1047   CALCIUM 9.7 04/06/2013 0956   ALKPHOS 113 04/11/2022 1047   ALKPHOS 101 04/06/2013 0956   AST 16 04/11/2022 1047   AST 24 04/06/2013 0956   ALT 10 04/11/2022 1047   ALT 23 04/06/2013 0956   BILITOT 0.9 04/11/2022 1047   BILITOT 0.34 04/06/2013 0956       Lab Results  Component Value Date   WBC 8.0 04/11/2022   HGB 13.9 04/11/2022   HCT 41.6 04/11/2022   MCV 97 04/11/2022   PLT 338 04/11/2022    Lab Results  Component Value Date   TSH 1.89 12/03/2017     Total time spent on today's visit was *** minutes, including both face-to-face time and nonface-to-face time.  Time included that spent on review of records (prior notes available to me/labs/imaging if pertinent), discussing treatment and goals, answering patient's questions and coordinating care.  Cc:  Dettinger, Fransisca Kaufmann, MD

## 2022-11-07 ENCOUNTER — Ambulatory Visit: Payer: Medicare Other | Admitting: Neurology

## 2022-11-07 ENCOUNTER — Encounter: Payer: Self-pay | Admitting: Neurology

## 2022-11-07 VITALS — BP 122/70 | HR 63 | Ht 67.0 in | Wt 196.4 lb

## 2022-11-07 DIAGNOSIS — G20A1 Parkinson's disease without dyskinesia, without mention of fluctuations: Secondary | ICD-10-CM

## 2022-11-07 MED ORDER — CARBIDOPA-LEVODOPA 25-100 MG PO TABS: 2.0000 | ORAL_TABLET | Freq: Three times a day (TID) | ORAL | 1 refills | Status: DC

## 2022-11-07 NOTE — Patient Instructions (Signed)
Increase carbidopa/levodopa 25/100, 2 tablets at 8am/noon/4pm  Local and Online Resources for Power over Parkinson's Group  April 2024   LOCAL [place] PARKINSON'S GROUPS   Power over Parkinson's Group:    Power Over Parkinson's Patient Education Group will be Wednesday, April 10th-*Hybrid meting*- in person at [facility] location and via [facility], 2:00-3:00 pm.   Power over [employer] and Care Partner Groups will meet together, with plans for separate break out session for caregivers, depending on topic/speaker Upcoming Power over Parkinson's Meetings/Care Partner Support:  2nd Wednesdays of the month at 2 pm:   April 10th, May 8th [place] at [email or web address] if interested in participating in this group    [place] OFFERINGS  NEW:  Parkinson's Social Game Night.  First Thursday of each month, 2:00-4:00 pm.  *Next date is April 4th*.  [place], [employer].  Contact [email or web address] if interested. Parkinson's CarePartner Group for Men is in the works, if interested email [email or web address] ACT FITNESS Chair Yoga classes "Train and Gain", Fridays 10 am, [employer].  Contact Gina at [phone].  Mining engineer Classes offering at [employer]!  TUESDAYS (Chair Yoga)  and Wednesdays (PWR! Moves)  1:00 pm.   Contact Vonna Kotyk at  [email or web address]  or [phone]  Drumming for Parkinson's will be held on 2nd and 4th Mondays at 11:00 am.   Located at the [place] ([place].)  Contact Doylene Canning at [email or web address] or [phone]  Dance for Parkinson 's classes will be on Tuesdays 10-11 am. Located in the [employer] , in the first floor of the [employer] ([place].) To register:  [email or web address] or [place] Class, Mondays at 11 am.  Call [phone] for details Hamil-Kerr Challenge.  Bike, Run, [employer] for [employer].  Saturday, April 6th at [facility].  To register, visit [email or web address] Moving Day [facility].  Saturday, May 4th, 10 am start.  Register at [employer].[place]:  [email or web address]  PD Health at Home continues:  Mindfulness Mondays, Wellness Wednesdays, Fitness Fridays  (PWR! Moves as part of Fitness Fridays March 22nd, 1-1:45 pm) Upcoming Education:   Parkinson's 101.  Wednesday, April 3rd, 1-2 pm Movement for Parkinson's.  Wednesday, May 1st, 1-2 pm Expert Briefing:  Research Update:  Working to [employer] PD.  Wednesday, April 10th, 1-2 pm Trouble with Zzz's:  Sleep Challenges with Parkinson's.  Wed, May 8th 1-2 pm Register for virtual education and expert briefings (webinars) at [email or web address] Please check out their website to sign up for emails and see their full online offerings     [place]:  [email or web address]   Third Thursday Webinars:  On the third Thursday of every month at 12 p.m. ET, join our free live webinars to learn about various aspects of living with Parkinson's disease and our work to speed medical breakthroughs.  Upcoming Webinar:  Let's Talk Taboos:  Hard-to-Discuss Parkinson's Symptoms.  Thursday, April 18th at 12 noon. Check out additional information on their website to see their full online offerings    [employer]:  [email or web address]  Upcoming Webinar:   Emergent Therapies.  Wednesday, April 2nd, 4 pm Series:  Living with Parkinson's Meetup.   Third Thursdays each month, 3 pm  Care Partner Monthly Meetup.  With Robin Searing Phinney.  First Tuesday of  each month, 2 pm  Check out additional information to Live Well Today on their website    Parkinson and Movement  Disorders (PMD) Alliance:  www.[email or web address]  [employer] Online:  Online Education Events  Sign up for emails, which are sent weekly to give you updates on programming and online offerings    Parkinson's Association of the [place]:  www.[email or web address]  Information on online support groups, education events, and online exercises including Yoga, Parkinson's exercises and more-LOTS of information on links to PD resources and online events  Virtual Support Group through Parkinson's Association of the [place]; next one is scheduled for Wednesday, April 3rd  MOVEMENT AND EXERCISE OPPORTUNITIES  PWR! Moves Classes at [place].  Wednesdays 10 and 11 am.   Contact Amy Marriott, PT [email or web address] if interested.  Parkinson's Exercise Class offerings at [employer]. *TUESDAYS* (Chair yoga) and Wednesdays (PWR! Moves)  1:00 pm.    Contact Vonna Kotyk at [email or web address]    Parkinson's Wellness Recovery (PWR! Moves)  www.[email or web address]  Info on the PWR! Virtual Experience:  You will have access to our expertise?through self-assessment, guided plans that start with the PD-specific fundamentals, educational content, tips, Q&A with an expert, and a growing Art therapist of PD-specific pre-recorded and live exercise classes of varying types and intensity - both physical and cognitive! If that is not enough, we offer 1:1 wellness consultations (in-person or virtual) to personalize your PWR! Research scientist (medical).   [place] Fridays:   As part of the PD Health @ Home program, this free video series focuses each week on one aspect of fitness designed to support people living with Parkinson's.? These weekly videos highlight the Person fitness guidelines for people with Parkinson's disease.  ModemG[employer].si  Dance for PD website is offering free, live-stream classes throughout the week,  as well as links to [employer] of classes:  [email or web address]  Virtual dance and Pilates for Parkinson's classes: Click on the Community Tab> Parkinson's Movement Initiative Tab.  To register for classes and for more information, visit [email or web address] and click the "community" tab.   [employer] Parkinson's Cycling Classes   [employer]:  Thursdays @ Noon-Live classes at [employer] ([employer] at [email or web address]?or [phone])  [facility]: Classes Tuesday, Wednesday and Thursday (contact [place] at [email or web address] ?or [phone])  [place]  Varied levels of classes are offered Tuesdays and Thursdays at [employer].   Stretching with Verdis Frederickson weekly class is also offered for people with Parkinson's  To observe a class or for more information, call [phone] or email Hezzie Bump at [email or web address]   ADDITIONAL SUPPORT AND RESOURCES  [employer]:  Online Caregiver Education Opportunities:  www.well-[email or web address]/caregiver-education/caregiver-support-group.  You may also contact Vickki Muff at [email or web address] or [phone].     [employer] April May Decisions Day:  The Most Critical Legal and Medical Decisions to Consider Now!  Tuesday, April 16th, 1-3 pm at [facility] at [facility].  Contact [facility] at [email or web address] or [phone] Powerful Tools for Caregivers.  6 week educational series for caregivers.  April 18-May 23, 10:30 am-12:15 pm at [facility] 3rd Floor Conference Room.   Contact 12-07-1992 at [email or web address] or [phone] to register Well-Spring Navigator:  Just1Navigator program, a?free service to help individuals and families through the journey of determining care for older adults.  The "Navigator" is a [facility], 07-02-2002, who will  speak with a prospective client and/or loved ones to provide an assessment of the situation and  a set of recommendations for a personalized care plan -- all free of charge, and whether?[employer] offers the needed service or not. If the need is not a service we provide, we are well-connected with reputable programs in town that we can refer you to.  [email or web address] or to speak with the Navigator, call [phone].

## 2022-12-21 ENCOUNTER — Other Ambulatory Visit: Payer: Self-pay | Admitting: Family Medicine

## 2022-12-21 DIAGNOSIS — E1169 Type 2 diabetes mellitus with other specified complication: Secondary | ICD-10-CM

## 2023-01-13 ENCOUNTER — Ambulatory Visit: Payer: Medicare Other

## 2023-02-19 ENCOUNTER — Encounter: Payer: Self-pay | Admitting: Family Medicine

## 2023-02-19 ENCOUNTER — Ambulatory Visit: Payer: Medicare Other | Admitting: Family Medicine

## 2023-02-19 VITALS — BP 136/77 | HR 73 | Ht 67.0 in | Wt 189.0 lb

## 2023-02-19 DIAGNOSIS — G20C Parkinsonism, unspecified: Secondary | ICD-10-CM

## 2023-02-19 DIAGNOSIS — E1159 Type 2 diabetes mellitus with other circulatory complications: Secondary | ICD-10-CM | POA: Diagnosis not present

## 2023-02-19 DIAGNOSIS — I152 Hypertension secondary to endocrine disorders: Secondary | ICD-10-CM | POA: Diagnosis not present

## 2023-02-19 DIAGNOSIS — E1169 Type 2 diabetes mellitus with other specified complication: Secondary | ICD-10-CM

## 2023-02-19 DIAGNOSIS — E782 Mixed hyperlipidemia: Secondary | ICD-10-CM

## 2023-02-19 DIAGNOSIS — I10 Essential (primary) hypertension: Secondary | ICD-10-CM

## 2023-02-19 LAB — BAYER DCA HB A1C WAIVED: HB A1C (BAYER DCA - WAIVED): 6.7 % — ABNORMAL HIGH (ref 4.8–5.6)

## 2023-02-19 MED ORDER — AMLODIPINE BESYLATE 5 MG PO TABS: 5.0000 mg | ORAL_TABLET | Freq: Every day | ORAL | 3 refills | Status: DC

## 2023-02-19 MED ORDER — ATORVASTATIN CALCIUM 40 MG PO TABS: 40.0000 mg | ORAL_TABLET | Freq: Every day | ORAL | 3 refills | Status: DC

## 2023-02-19 MED ORDER — IRBESARTAN 75 MG PO TABS: 75.0000 mg | ORAL_TABLET | Freq: Every day | ORAL | 3 refills | Status: DC

## 2023-02-19 NOTE — Progress Notes (Signed)
BP 136/77   Pulse 73   Ht 5\' 7"  (1.702 m)   Wt 189 lb (85.7 kg)   SpO2 90%   BMI 29.60 kg/m    Subjective:   Patient ID: Paul [name], male    DOB: Nov 29, 1940, 82 y.o.   MRN: [id number]  HPI: Paul [name] is a 82 y.o. male presenting on 02/19/2023 for Medical Management of Chronic Issues and Diabetes   HPI Type 2 diabetes mellitus Patient comes in today for recheck of his diabetes. Patient has been currently taking no medication currently, diet control for diabetes. Patient is currently on an ACE inhibitor/ARB. Patient has not seen an ophthalmologist this year. Patient denies any new issues with their feet. The symptom started onset as an adult hypertension and hyperlipidemia and Parkinson's ARE RELATED TO DM   Hypertension Patient is currently on amlodipine and irbesartan, and their blood pressure today is 136/77. Patient denies any lightheadedness or dizziness. Patient denies headaches, blurred vision, chest pains, shortness of breath, or weakness. Denies any side effects from medication and is content with current medication.   Hyperlipidemia Patient is coming in for recheck of his hyperlipidemia. The patient is currently taking atorvastatin. They deny any issues with myalgias or history of liver damage from it. They deny any focal numbness or weakness or chest pain.   Relevant past medical, surgical, family and social history reviewed and updated as indicated. Interim medical history since our last visit reviewed. Allergies and medications reviewed and updated.  Review of Systems  Constitutional:  Negative for chills and fever.  Eyes:  Negative for visual disturbance.  Respiratory:  Negative for shortness of breath and wheezing.   Cardiovascular:  Negative for chest pain and leg swelling.  Musculoskeletal:  Negative for back pain and gait problem.  Skin:  Negative for rash.  Neurological:  Positive for tremors. Negative for dizziness, weakness and light-headedness.   All other systems reviewed and are negative.   Per HPI unless specifically indicated above   Allergies as of 02/19/2023       Reactions   Sulfonamide Derivatives    REACTION: rash        Medication List        Accurate as of February 19, 2023  8:22 AM. If you have any questions, ask your nurse or doctor.          acetaminophen 500 MG tablet Commonly known as: TYLENOL Take 1,000 mg by mouth every 6 (six) hours as needed.   albuterol 108 (90 Base) MCG/ACT inhaler Commonly known as: VENTOLIN HFA Inhale 2 puffs into the lungs every 6 (six) hours as needed for wheezing or shortness of breath.   amLODipine 5 MG tablet Commonly known as: NORVASC Take 1 tablet (5 mg total) by mouth daily.   aspirin 81 MG tablet Take 1 tablet (81 mg total) by mouth daily with breakfast.   atorvastatin 40 MG tablet Commonly known as: LIPITOR Take 1 tablet (40 mg total) by mouth daily.   carbidopa-levodopa 25-100 MG tablet Commonly known as: SINEMET IR Take 2 tablets by mouth 3 (three) times daily. 8am/noon/4pm   irbesartan 75 MG tablet Commonly known as: AVAPRO Take 1 tablet (75 mg total) by mouth daily.   pantoprazole 40 MG tablet Commonly known as: PROTONIX Take 1 tablet (40 mg total) by mouth every other day.   Vitamin D3 50 MCG (2000 UT) Tabs Take 2,000 Units by mouth daily.         Objective:  BP 136/77   Pulse 73   Ht 5\' 7"  (1.702 m)   Wt 189 lb (85.7 kg)   SpO2 90%   BMI 29.60 kg/m   Wt Readings from Last 3 Encounters:  02/19/23 189 lb (85.7 kg)  11/07/22 196 lb 6.4 oz (89.1 kg)  07/31/22 195 lb (88.5 kg)    Physical Exam Vitals and nursing note reviewed.  Constitutional:      General: He is not in acute distress.    Appearance: He is well-developed. He is not diaphoretic.  Eyes:     General: No scleral icterus.    Conjunctiva/sclera: Conjunctivae normal.  Neck:     Thyroid: No thyromegaly.  Cardiovascular:     Rate and Rhythm: Normal rate and  regular rhythm.     Heart sounds: Normal heart sounds. No murmur heard. Pulmonary:     Effort: Pulmonary effort is normal. No respiratory distress.     Breath sounds: Normal breath sounds. No wheezing.  Musculoskeletal:        General: Normal range of motion.     Cervical back: Neck supple.  Lymphadenopathy:     Cervical: No cervical adenopathy.  Skin:    General: Skin is warm and dry.     Findings: No rash.  Neurological:     Mental Status: He is alert and oriented to person, place, and time.     Coordination: Coordination normal.  Psychiatric:        Behavior: Behavior normal.       Assessment & Plan:   Problem List Items Addressed This Visit       Cardiovascular and Mediastinum   HTN (hypertension)   Relevant Medications   amLODipine (NORVASC) 5 MG tablet   atorvastatin (LIPITOR) 40 MG tablet   irbesartan (AVAPRO) 75 MG tablet     Endocrine   Type 2 diabetes mellitus with other specified complication (HCC) - Primary   Relevant Medications   atorvastatin (LIPITOR) 40 MG tablet   irbesartan (AVAPRO) 75 MG tablet   Other Relevant Orders   Bayer DCA Hb A1c Waived     Other   HLD (hyperlipidemia)   Relevant Medications   amLODipine (NORVASC) 5 MG tablet   atorvastatin (LIPITOR) 40 MG tablet   irbesartan (AVAPRO) 75 MG tablet   Other Visit Diagnoses     Hypertension associated with diabetes (HCC)  (Chronic)      Relevant Medications   amLODipine (NORVASC) 5 MG tablet   atorvastatin (LIPITOR) 40 MG tablet   irbesartan (AVAPRO) 75 MG tablet   Hyperlipidemia associated with type 2 diabetes mellitus (HCC)       Relevant Medications   amLODipine (NORVASC) 5 MG tablet   atorvastatin (LIPITOR) 40 MG tablet   irbesartan (AVAPRO) 75 MG tablet     A1c 6.7 which is a little higher than it has been, continue to focus on diet  He has dropped about 7 pounds and is trying to move more, encouraged more activity  Follow up plan: Return in about 3 months (around  05/22/2023), or if symptoms worsen or fail to improve, for Diabetes recheck.  Counseling provided for all of the vaccine components Orders Placed This Encounter  Procedures   Bayer DCA Hb A1c Waived    Arville Care, MD [facility] 02/19/2023, 8:22 AM

## 2023-05-02 ENCOUNTER — Other Ambulatory Visit: Payer: Self-pay | Admitting: Neurology

## 2023-05-02 DIAGNOSIS — G20A1 Parkinson's disease without dyskinesia, without mention of fluctuations: Secondary | ICD-10-CM

## 2023-05-16 ENCOUNTER — Ambulatory Visit: Payer: Medicare Other | Admitting: Neurology

## 2023-05-22 ENCOUNTER — Encounter: Payer: Self-pay | Admitting: Family Medicine

## 2023-05-22 ENCOUNTER — Ambulatory Visit: Payer: Medicare Other | Admitting: Family Medicine

## 2023-05-22 VITALS — BP 130/70 | HR 70 | Ht 67.0 in | Wt 188.0 lb

## 2023-05-22 DIAGNOSIS — I10 Essential (primary) hypertension: Secondary | ICD-10-CM

## 2023-05-22 DIAGNOSIS — E782 Mixed hyperlipidemia: Secondary | ICD-10-CM

## 2023-05-22 DIAGNOSIS — Z23 Encounter for immunization: Secondary | ICD-10-CM | POA: Diagnosis not present

## 2023-05-22 DIAGNOSIS — E1159 Type 2 diabetes mellitus with other circulatory complications: Secondary | ICD-10-CM

## 2023-05-22 DIAGNOSIS — I152 Hypertension secondary to endocrine disorders: Secondary | ICD-10-CM

## 2023-05-22 DIAGNOSIS — E785 Hyperlipidemia, unspecified: Secondary | ICD-10-CM

## 2023-05-22 DIAGNOSIS — E1169 Type 2 diabetes mellitus with other specified complication: Secondary | ICD-10-CM

## 2023-05-22 LAB — CBC WITH DIFFERENTIAL/PLATELET
Basophils Absolute: 0 10*3/uL (ref 0.0–0.2)
Basos: 1 %
EOS (ABSOLUTE): 0.1 10*3/uL (ref 0.0–0.4)
Eos: 1 %
Hematocrit: 42.9 % (ref 37.5–51.0)
Hemoglobin: 13.8 g/dL (ref 13.0–17.7)
Immature Grans (Abs): 0 10*3/uL (ref 0.0–0.1)
Immature Granulocytes: 0 %
Lymphocytes Absolute: 3.2 10*3/uL — ABNORMAL HIGH (ref 0.7–3.1)
Lymphs: 46 %
MCH: 32.9 pg (ref 26.6–33.0)
MCHC: 32.2 g/dL (ref 31.5–35.7)
MCV: 102 fL — ABNORMAL HIGH (ref 79–97)
Monocytes Absolute: 0.7 10*3/uL (ref 0.1–0.9)
Monocytes: 10 %
Neutrophils Absolute: 2.9 10*3/uL (ref 1.4–7.0)
Neutrophils: 42 %
Platelets: 324 10*3/uL (ref 150–450)
RBC: 4.19 x10E6/uL (ref 4.14–5.80)
RDW: 12.8 % (ref 11.6–15.4)
WBC: 6.9 10*3/uL (ref 3.4–10.8)

## 2023-05-22 LAB — LIPID PANEL
Chol/HDL Ratio: 2.3 {ratio} (ref 0.0–5.0)
Cholesterol, Total: 154 mg/dL (ref 100–199)
HDL: 66 mg/dL (ref >39)
LDL Chol Calc (NIH): 71 mg/dL (ref 0–99)
Triglycerides: 95 mg/dL (ref 0–149)
VLDL Cholesterol Cal: 17 mg/dL (ref 5–40)

## 2023-05-22 LAB — CMP14+EGFR
ALT: 11 [IU]/L (ref 0–44)
AST: 14 [IU]/L (ref 0–40)
Albumin: 4.5 g/dL (ref 3.7–4.7)
Alkaline Phosphatase: 103 [IU]/L (ref 44–121)
BUN/Creatinine Ratio: 16 (ref 10–24)
BUN: 16 mg/dL (ref 8–27)
Bilirubin Total: 0.8 mg/dL (ref 0.0–1.2)
CO2: 25 mmol/L (ref 20–29)
Calcium: 9.8 mg/dL (ref 8.6–10.2)
Chloride: 102 mmol/L (ref 96–106)
Creatinine, Ser: 0.99 mg/dL (ref 0.76–1.27)
Globulin, Total: 2.7 g/dL (ref 1.5–4.5)
Glucose: 115 mg/dL — ABNORMAL HIGH (ref 70–99)
Potassium: 4.3 mmol/L (ref 3.5–5.2)
Sodium: 141 mmol/L (ref 134–144)
Total Protein: 7.2 g/dL (ref 6.0–8.5)
eGFR: 77 mL/min/{1.73_m2} (ref >59)

## 2023-05-22 LAB — BAYER DCA HB A1C WAIVED: HB A1C (BAYER DCA - WAIVED): 6.4 % — ABNORMAL HIGH (ref 4.8–5.6)

## 2023-05-22 NOTE — Progress Notes (Signed)
BP 130/70   Pulse 70   Ht 5\' 7"  (1.702 m)   Wt 188 lb (85.3 kg)   SpO2 92%   BMI 29.44 kg/m    Subjective:   Patient ID: Paul [name], male    DOB: 24-Sep-1940, 82 y.o.   MRN: [id number]  HPI: Paul [name] is a 82 y.o. male presenting on 05/22/2023 for Medical Management of Chronic Issues and Diabetes   HPI Type 2 diabetes mellitus Patient comes in today for recheck of his diabetes. Patient has been currently taking no medicine, diet control. Patient is currently on an ACE inhibitor/ARB. Patient has seen an ophthalmologist this year. Patient denies any new issues with their feet. The symptom started onset as an adult hypertension and hyperlipidemia ARE RELATED TO DM   Hypertension Patient is currently on amlodipine and irbesartan, and their blood pressure today is 130/70. Patient denies any lightheadedness or dizziness. Patient denies headaches, blurred vision, chest pains, shortness of breath, or weakness. Denies any side effects from medication and is content with current medication.   Hyperlipidemia Patient is coming in for recheck of his hyperlipidemia. The patient is currently taking atorvastatin and aspirin. They deny any issues with myalgias or history of liver damage from it. They deny any focal numbness or weakness or chest pain.   Relevant past medical, surgical, family and social history reviewed and updated as indicated. Interim medical history since our last visit reviewed. Allergies and medications reviewed and updated.  Review of Systems  Constitutional:  Negative for chills and fever.  Eyes:  Negative for discharge.  Respiratory:  Negative for shortness of breath and wheezing.   Cardiovascular:  Negative for chest pain and leg swelling.  Musculoskeletal:  Positive for gait problem.  Skin:  Negative for rash.  Neurological:  Positive for tremors.  All other systems reviewed and are negative.   Per HPI unless specifically indicated above   Allergies as  of 05/22/2023       Reactions   Sulfonamide Derivatives    REACTION: rash        Medication List        Accurate as of May 22, 2023  8:29 AM. If you have any questions, ask your nurse or doctor.          acetaminophen 500 MG tablet Commonly known as: TYLENOL Take 1,000 mg by mouth every 6 (six) hours as needed.   albuterol 108 (90 Base) MCG/ACT inhaler Commonly known as: VENTOLIN HFA Inhale 2 puffs into the lungs every 6 (six) hours as needed for wheezing or shortness of breath.   amLODipine 5 MG tablet Commonly known as: NORVASC Take 1 tablet (5 mg total) by mouth daily.   aspirin 81 MG tablet Take 1 tablet (81 mg total) by mouth daily with breakfast.   atorvastatin 40 MG tablet Commonly known as: LIPITOR Take 1 tablet (40 mg total) by mouth daily.   carbidopa-levodopa 25-100 MG tablet Commonly known as: SINEMET IR TAKE 2 TABLETS BY MOUTH THREE TIMES DAILY (8 AM/NOON/4 PM)   irbesartan 75 MG tablet Commonly known as: AVAPRO Take 1 tablet (75 mg total) by mouth daily.   pantoprazole 40 MG tablet Commonly known as: PROTONIX Take 1 tablet (40 mg total) by mouth every other day.   Vitamin D3 50 MCG (2000 UT) Tabs Take 2,000 Units by mouth daily.         Objective:   BP 130/70   Pulse 70   Ht 5\' 7"  (1.702  m)   Wt 188 lb (85.3 kg)   SpO2 92%   BMI 29.44 kg/m   Wt Readings from Last 3 Encounters:  05/22/23 188 lb (85.3 kg)  02/19/23 189 lb (85.7 kg)  11/07/22 196 lb 6.4 oz (89.1 kg)    Physical Exam Vitals and nursing note reviewed.  Constitutional:      General: He is not in acute distress.    Appearance: He is well-developed. He is not diaphoretic.  Eyes:     General: No scleral icterus.    Conjunctiva/sclera: Conjunctivae normal.  Neck:     Thyroid: No thyromegaly.  Cardiovascular:     Rate and Rhythm: Normal rate and regular rhythm.     Heart sounds: Normal heart sounds. No murmur heard. Pulmonary:     Effort: Pulmonary effort is  normal. No respiratory distress.     Breath sounds: Normal breath sounds. No wheezing.  Musculoskeletal:        General: Normal range of motion.     Cervical back: Neck supple.  Lymphadenopathy:     Cervical: No cervical adenopathy.  Skin:    General: Skin is warm and dry.     Findings: No rash.  Neurological:     Mental Status: He is alert and oriented to person, place, and time. Mental status is at baseline.     Motor: Tremor present.     Coordination: Coordination normal.  Psychiatric:        Behavior: Behavior normal.       Assessment & Plan:   Problem List Items Addressed This Visit       Cardiovascular and Mediastinum   HTN (hypertension)     Endocrine   Type 2 diabetes mellitus with other specified complication (HCC) - Primary   Relevant Orders   CBC with Differential/Platelet   CMP14+EGFR   Lipid panel   Bayer DCA Hb A1c Waived     Other   HLD (hyperlipidemia)   Other Visit Diagnoses     Hypertension associated with diabetes (HCC)       Relevant Orders   CBC with Differential/Platelet   CMP14+EGFR   Lipid panel   Bayer DCA Hb A1c Waived   Hyperlipidemia associated with type 2 diabetes mellitus (HCC)       Relevant Orders   CBC with Differential/Platelet   CMP14+EGFR   Lipid panel   Bayer DCA Hb A1c Waived       A1c is 6.7, looks good, blood pressure looks good as well.  No changes Follow up plan: Return in about 3 months (around 08/22/2023), or if symptoms worsen or fail to improve, for Diabetes recheck.  Counseling provided for all of the vaccine components Orders Placed This Encounter  Procedures   CBC with Differential/Platelet   CMP14+EGFR   Lipid panel   Bayer DCA Hb A1c Waived    Arville Care, MD [facility] 05/22/2023, 8:29 AM

## 2023-05-26 NOTE — Progress Notes (Unsigned)
Assessment/Plan:   1.  Parkinsons Disease  -continue carbidopa/levodopa 25/100, 2 tablet at 8 AM/noon/4 PM.    -We discussed that it used to be thought that levodopa would increase risk of melanoma but now it is believed that Parkinsons itself likely increases risk of melanoma. he is to get regular skin checks. We discussed last visit as well  2.  Constipation  -discussed nature and pathophysiology and association with PD  -discussed importance of hydration.  Pt is to increase water intake  -pt is given a copy of the rancho recipe  -recommended daily colace  -recommended miralax prn  Subjective:   Paul [name] was seen today in follow up for Parkinsons disease.  My previous records were reviewed prior to todays visit as well as outside records available to me. Pt with wife who supplements hx.  we slightly increased his levodopa last visit.  He did well with that.  He has had 1 fall - he was putting gas in the lawnmower and fell.  Didn't get help.  He last saw primary care October 17.  Notes reviewed.  Does c/o constipation  Current prescribed movement disorder medications: Carbidopa/levodopa 25/100, 2 tablet 3 times per day (an increase)   ALLERGIES:   Allergies  Allergen Reactions   Sulfonamide Derivatives     REACTION: rash    CURRENT MEDICATIONS:  Outpatient Encounter Medications as of 05/29/2023  Medication Sig   acetaminophen (TYLENOL) 500 MG tablet Take 1,000 mg by mouth every 6 (six) hours as needed.   amLODipine (NORVASC) 5 MG tablet Take 1 tablet (5 mg total) by mouth daily.   aspirin 81 MG tablet Take 1 tablet (81 mg total) by mouth daily with breakfast.   atorvastatin (LIPITOR) 40 MG tablet Take 1 tablet (40 mg total) by mouth daily.   carbidopa-levodopa (SINEMET IR) 25-100 MG tablet TAKE 2 TABLETS BY MOUTH THREE TIMES DAILY (8 AM/NOON/4 PM)   Cholecalciferol (VITAMIN D3) 2000 UNITS TABS Take 2,000 Units by mouth daily.   irbesartan (AVAPRO) 75 MG tablet Take  1 tablet (75 mg total) by mouth daily.   pantoprazole (PROTONIX) 40 MG tablet Take 1 tablet (40 mg total) by mouth every other day.   albuterol (VENTOLIN HFA) 108 (90 Base) MCG/ACT inhaler Inhale 2 puffs into the lungs every 6 (six) hours as needed for wheezing or shortness of breath. (Patient not taking: Reported on 05/29/2023)   No facility-administered encounter medications on file as of 05/29/2023.    Objective:   PHYSICAL EXAMINATION:    VITALS:   Vitals:   05/29/23 0852  BP: 138/70  Pulse: 71  SpO2: 90%  Weight: 192 lb 3.2 oz (87.2 kg)  Height: 5\' 7"  (1.702 m)   GEN:  The patient appears stated age and is in NAD. HEENT:  Normocephalic, atraumatic.  The mucous membranes are moist. The superficial temporal arteries are without ropiness or tenderness. Cv:  rrr Lungs:  ctab Neck:  no bruits  He has not taken his medication yet today.   Neurological examination:  Orientation: The patient is alert and oriented x3. Cranial nerves: There is good facial symmetry with facial hypomimia. The speech is fluent and clear.  Hearing is intact to conversational tone. Sensation: Sensation is intact to light touch throughout Motor: Strength is at least antigravity x4.   Movement examination: Tone: There is mild increased tone in the RUE Abnormal movements: there is RUE rest tremor, overall mild (same as prior) Coordination:  There is mild decremation  with any form of RAMS, including alternating supination and pronation of the forearm, hand opening and closing, finger taps, heel taps and toe taps, R>L Gait and Station: The patient has min difficulty arising out of a deep-seated chair without the use of the hands. The patient's stride length is good.  He has almost no arm swing on the right.  He slightly drags the R leg.  This is similar to last visits  I have reviewed and interpreted the following labs independently    Chemistry      Component Value Date/Time   NA 141 05/22/2023 0802    NA 141 04/06/2013 0956   K 4.3 05/22/2023 0802   K 4.1 04/06/2013 0956   CL 102 05/22/2023 0802   CL 106 10/16/2012 1022   CO2 25 05/22/2023 0802   CO2 24 04/06/2013 0956   BUN 16 05/22/2023 0802   BUN 16.7 04/06/2013 0956   CREATININE 0.99 05/22/2023 0802   CREATININE 1.0 04/06/2013 0956      Component Value Date/Time   CALCIUM 9.8 05/22/2023 0802   CALCIUM 9.7 04/06/2013 0956   ALKPHOS 103 05/22/2023 0802   ALKPHOS 101 04/06/2013 0956   AST 14 05/22/2023 0802   AST 24 04/06/2013 0956   ALT 11 05/22/2023 0802   ALT 23 04/06/2013 0956   BILITOT 0.8 05/22/2023 0802   BILITOT 0.34 04/06/2013 0956       Lab Results  Component Value Date   WBC 6.9 05/22/2023   HGB 13.8 05/22/2023   HCT 42.9 05/22/2023   MCV 102 (H) 05/22/2023   PLT 324 05/22/2023    Lab Results  Component Value Date   TSH 1.89 12/03/2017   Total time spent on today's visit was 30 minutes, including both face-to-face time and nonface-to-face time.  Time included that spent on review of records (prior notes available to me/labs/imaging if pertinent), discussing treatment and goals, answering patient's questions and coordinating care.   Cc:  Dettinger, Elige Radon, MD

## 2023-05-29 ENCOUNTER — Encounter: Payer: Self-pay | Admitting: Neurology

## 2023-05-29 ENCOUNTER — Ambulatory Visit: Payer: Medicare Other | Admitting: Neurology

## 2023-05-29 VITALS — BP 138/70 | HR 71 | Ht 67.0 in | Wt 192.2 lb

## 2023-05-29 DIAGNOSIS — K5901 Slow transit constipation: Secondary | ICD-10-CM

## 2023-05-29 DIAGNOSIS — G20A1 Parkinson's disease without dyskinesia, without mention of fluctuations: Secondary | ICD-10-CM

## 2023-05-29 MED ORDER — CARBIDOPA-LEVODOPA 25-100 MG PO TABS
ORAL_TABLET | ORAL | 1 refills | Status: DC
Start: 2023-05-29 — End: 2024-02-20

## 2023-05-29 NOTE — Patient Instructions (Addendum)
Constipation and Parkinson's disease: ° °1.Rancho recipe for constipation in Parkinsons Disease: ° -1 cup of unprocessed bran (need to get this at [employer], [employer] or similar type of store), 2 cups of applesauce in 1 cup of prune juice °2.  Increase fiber intake (Metamucil,vegetables) °3.  Regular, moderate exercise can be beneficial. °4.  Avoid medications causing constipation, such as medications like antacids with calcium or magnesium °5.  It's okay to take daily Miralax, and taper if stools become too loose or you experience diarrhea °6.  Stool softeners (Colace) can help with chronic constipation and I recommend you take this daily. °7.  Increase water intake.  You should be drinking 1/2 gallon of water a day as long as you have not been diagnosed with congestive heart failure or renal/kidney failure.  This is probably the single greatest thing that you can do to help your constipation. ° °The physicians and staff at [facility] are committed to providing excellent care. You may receive a survey requesting feedback about your experience at our office. We strive to receive "very good" responses to the survey questions. If you feel that your experience would prevent you from giving the office a "very good " response, please contact our office to try to remedy the situation. We may be reached at [phone]. Thank you for taking the time out of your busy day to complete the survey. ° ° °

## 2023-06-05 DIAGNOSIS — N200 Calculus of kidney: Secondary | ICD-10-CM | POA: Diagnosis not present

## 2023-06-05 DIAGNOSIS — C61 Malignant neoplasm of prostate: Secondary | ICD-10-CM | POA: Diagnosis not present

## 2023-06-05 DIAGNOSIS — G20A2 Parkinson's disease without dyskinesia, with fluctuations: Secondary | ICD-10-CM | POA: Diagnosis not present

## 2023-06-05 DIAGNOSIS — N393 Stress incontinence (female) (male): Secondary | ICD-10-CM | POA: Diagnosis not present

## 2023-06-25 DIAGNOSIS — Z833 Family history of diabetes mellitus: Secondary | ICD-10-CM | POA: Diagnosis not present

## 2023-06-25 DIAGNOSIS — Z8616 Personal history of COVID-19: Secondary | ICD-10-CM | POA: Diagnosis not present

## 2023-06-25 DIAGNOSIS — G20A1 Parkinson's disease without dyskinesia, without mention of fluctuations: Secondary | ICD-10-CM | POA: Diagnosis not present

## 2023-06-25 DIAGNOSIS — E785 Hyperlipidemia, unspecified: Secondary | ICD-10-CM | POA: Diagnosis not present

## 2023-06-25 DIAGNOSIS — Z7982 Long term (current) use of aspirin: Secondary | ICD-10-CM | POA: Diagnosis not present

## 2023-06-25 DIAGNOSIS — K219 Gastro-esophageal reflux disease without esophagitis: Secondary | ICD-10-CM | POA: Diagnosis not present

## 2023-06-25 DIAGNOSIS — M1990 Unspecified osteoarthritis, unspecified site: Secondary | ICD-10-CM | POA: Diagnosis not present

## 2023-06-25 DIAGNOSIS — I10 Essential (primary) hypertension: Secondary | ICD-10-CM | POA: Diagnosis not present

## 2023-07-17 DIAGNOSIS — H5203 Hypermetropia, bilateral: Secondary | ICD-10-CM | POA: Diagnosis not present

## 2023-07-17 LAB — HM DIABETES EYE EXAM

## 2023-08-22 ENCOUNTER — Ambulatory Visit: Payer: Medicare Other | Admitting: Family Medicine

## 2023-08-22 ENCOUNTER — Encounter: Payer: Self-pay | Admitting: Family Medicine

## 2023-08-22 VITALS — BP 135/72 | HR 71 | Ht 67.0 in | Wt 189.0 lb

## 2023-08-22 DIAGNOSIS — E1169 Type 2 diabetes mellitus with other specified complication: Secondary | ICD-10-CM

## 2023-08-22 DIAGNOSIS — I152 Hypertension secondary to endocrine disorders: Secondary | ICD-10-CM

## 2023-08-22 DIAGNOSIS — E1159 Type 2 diabetes mellitus with other circulatory complications: Secondary | ICD-10-CM

## 2023-08-22 DIAGNOSIS — E782 Mixed hyperlipidemia: Secondary | ICD-10-CM

## 2023-08-22 DIAGNOSIS — K219 Gastro-esophageal reflux disease without esophagitis: Secondary | ICD-10-CM

## 2023-08-22 DIAGNOSIS — I10 Essential (primary) hypertension: Secondary | ICD-10-CM

## 2023-08-22 DIAGNOSIS — E785 Hyperlipidemia, unspecified: Secondary | ICD-10-CM

## 2023-08-22 LAB — BAYER DCA HB A1C WAIVED: HB A1C (BAYER DCA - WAIVED): 6.1 % — ABNORMAL HIGH (ref 4.8–5.6)

## 2023-08-22 MED ORDER — PANTOPRAZOLE SODIUM 40 MG PO TBEC
40.0000 mg | DELAYED_RELEASE_TABLET | ORAL | 3 refills | Status: DC
Start: 2023-08-22 — End: 2023-12-04

## 2023-08-22 NOTE — Progress Notes (Signed)
BP 135/72   Pulse 71   Ht 5\' 7"  (1.702 m)   Wt 189 lb (85.7 kg)   SpO2 94%   BMI 29.60 kg/m    Subjective:   Patient ID: Paul [name], male    DOB: 1941/06/09, 83 y.o.   MRN: [id number]  HPI: Paul [name] is a 83 y.o. male presenting on 08/22/2023 for Medical Management of Chronic Issues and Diabetes   HPI Type 2 diabetes mellitus Patient comes in today for recheck of his diabetes. Patient has been currently taking no medicine currently, diet control. Patient is currently on an ACE inhibitor/ARB. Patient has not seen an ophthalmologist this year. Patient denies any new issues with their feet. The symptom started onset as an adult hypertension and hyperlipidemia ARE RELATED TO DM   Hypertension Patient is currently on amlodipine and irbesartan, and their blood pressure today is 135/72. Patient denies any lightheadedness or dizziness. Patient denies headaches, blurred vision, chest pains, shortness of breath, or weakness. Denies any side effects from medication and is content with current medication.   Hyperlipidemia Patient is coming in for recheck of his hyperlipidemia. The patient is currently taking atorvastatin. They deny any issues with myalgias or history of liver damage from it. They deny any focal numbness or weakness or chest pain.   GERD Patient is currently on pantoprazole.  She denies any major symptoms or abdominal pain or belching or burping. She denies any blood in her stool or lightheadedness or dizziness.   Parkinson's disease Patient has Parkinson disease and takes carbidopa levodopa and sees neurology.  Relevant past medical, surgical, family and social history reviewed and updated as indicated. Interim medical history since our last visit reviewed. Allergies and medications reviewed and updated.  Review of Systems  Constitutional:  Negative for chills and fever.  Eyes:  Negative for visual disturbance.  Respiratory:  Negative for shortness of breath  and wheezing.   Cardiovascular:  Negative for chest pain and leg swelling.  Musculoskeletal:  Negative for back pain and gait problem.  Skin:  Negative for rash.  Neurological:  Positive for tremors. Negative for dizziness and light-headedness.  All other systems reviewed and are negative.   Per HPI unless specifically indicated above   Allergies as of 08/22/2023       Reactions   Sulfonamide Derivatives    REACTION: rash        Medication List        Accurate as of August 22, 2023 11:59 PM. If [name] have any questions, ask your nurse or doctor.          acetaminophen 500 MG tablet Commonly known as: TYLENOL Take 1,000 mg by mouth every 6 (six) hours as needed.   albuterol 108 (90 Base) MCG/ACT inhaler Commonly known as: VENTOLIN HFA Inhale 2 puffs into the lungs every 6 (six) hours as needed for wheezing or shortness of breath.   amLODipine 5 MG tablet Commonly known as: NORVASC Take 1 tablet (5 mg total) by mouth daily.   aspirin 81 MG tablet Take 1 tablet (81 mg total) by mouth daily with breakfast.   atorvastatin 40 MG tablet Commonly known as: LIPITOR Take 1 tablet (40 mg total) by mouth daily.   carbidopa-levodopa 25-100 MG tablet Commonly known as: SINEMET IR 2 at 8am/noon/4pm   irbesartan 75 MG tablet Commonly known as: AVAPRO Take 1 tablet (75 mg total) by mouth daily.   pantoprazole 40 MG tablet Commonly known as: PROTONIX Take 1 tablet (  40 mg total) by mouth every other day.   Vitamin D3 50 MCG (2000 UT) Tabs Take 2,000 Units by mouth daily.         Objective:   BP 135/72   Pulse 71   Ht 5\' 7"  (1.702 m)   Wt 189 lb (85.7 kg)   SpO2 94%   BMI 29.60 kg/m   Wt Readings from Last 3 Encounters:  08/22/23 189 lb (85.7 kg)  05/29/23 192 lb 3.2 oz (87.2 kg)  05/22/23 188 lb (85.3 kg)    Physical Exam Vitals and nursing note reviewed.  Constitutional:      General: He is not in acute distress.    Appearance: He is well-developed.  He is not diaphoretic.  Eyes:     General: No scleral icterus.    Conjunctiva/sclera: Conjunctivae normal.  Neck:     Thyroid: No thyromegaly.  Cardiovascular:     Rate and Rhythm: Normal rate and regular rhythm.     Heart sounds: Normal heart sounds. No murmur heard. Pulmonary:     Effort: Pulmonary effort is normal. No respiratory distress.     Breath sounds: Normal breath sounds. No wheezing.  Musculoskeletal:        General: No swelling. Normal range of motion.     Cervical back: Neck supple.  Lymphadenopathy:     Cervical: No cervical adenopathy.  Skin:    General: Skin is warm and dry.     Findings: No rash.  Neurological:     Mental Status: He is alert and oriented to person, place, and time.     Coordination: Coordination normal.  Psychiatric:        Behavior: Behavior normal.       Assessment & Plan:   Problem List Items Addressed This Visit       Cardiovascular and Mediastinum   Hypertension associated with type 2 diabetes mellitus (HCC)     Digestive   GERD (gastroesophageal reflux disease)   Relevant Medications   pantoprazole (PROTONIX) 40 MG tablet     Endocrine   Hyperlipidemia associated with type 2 diabetes mellitus (HCC)   Type 2 diabetes mellitus with other specified complication (HCC) - Primary   Relevant Orders   Bayer DCA Hb A1c Waived (Completed)   Microalbumin / creatinine urine ratio (Completed)    A1c is 6.1, looks good.  No change in medicine. Follow up plan: Return in about 3 months (around 11/20/2023), or if symptoms worsen or fail to improve, for Diabetes and hypertension.  Counseling provided for all of the vaccine components Orders Placed This Encounter  Procedures   Bayer DCA Hb A1c Waived   Microalbumin / creatinine urine ratio    Arville Care, MD [place] 08/28/2023, 7:39 AM

## 2023-08-23 LAB — MICROALBUMIN / CREATININE URINE RATIO
Creatinine, Urine: 85.1 mg/dL
Microalb/Creat Ratio: 22 mg/g{creat} (ref 0–29)
Microalbumin, Urine: 18.7 ug/mL

## 2023-11-02 NOTE — Progress Notes (Unsigned)
 Assessment/Plan:   1.  Parkinsons Disease  -continue carbidopa/levodopa 25/100, 2 tablet at 8 AM/noon/4 PM.  He looked underdosed but had just taken first morning dose 15 min prior to being seen  -We discussed that it used to be thought that levodopa would increase risk of melanoma but now it is believed that Parkinsons itself likely increases risk of melanoma. he is to get regular skin checks. We discussed last visit as well  2.  Constipation  -discussed nature and pathophysiology and association with PD  -discussed importance of hydration.  Pt is to increase water intake  -pt is given a copy of the rancho recipe  -recommended daily colace  -recommended miralax prn   -discussed prunes!    -increase exercise  Subjective:   Paul [name] was seen today in follow up for Parkinsons disease.  My previous records were reviewed prior to todays visit as well as outside records available to me. Pt with wife who supplements hx. patient is taking his levodopa faithfully, without side effects.  No hallucinations.  He saw urology October 31.  He saw primary care in January.  On 2 different antihypertensives, but no lightheadedness or near syncope.  Notes are reviewed.  Still c/o constipation.  Not hydrating well.    Current prescribed movement disorder medications: Carbidopa/levodopa 25/100, 2 tablet 3 times per day (an increase)   ALLERGIES:   Allergies  Allergen Reactions   Sulfonamide Derivatives     REACTION: rash    CURRENT MEDICATIONS:  Outpatient Encounter Medications as of 11/04/2023  Medication Sig   acetaminophen (TYLENOL) 500 MG tablet Take 1,000 mg by mouth every 6 (six) hours as needed.   albuterol (VENTOLIN HFA) 108 (90 Base) MCG/ACT inhaler Inhale 2 puffs into the lungs every 6 (six) hours as needed for wheezing or shortness of breath.   amLODipine (NORVASC) 5 MG tablet Take 1 tablet (5 mg total) by mouth daily.   aspirin 81 MG tablet Take 1 tablet (81 mg total) by mouth  daily with breakfast.   atorvastatin (LIPITOR) 40 MG tablet Take 1 tablet (40 mg total) by mouth daily.   carbidopa-levodopa (SINEMET IR) 25-100 MG tablet 2 at 8am/noon/4pm   Cholecalciferol (VITAMIN D3) 2000 UNITS TABS Take 2,000 Units by mouth daily.   irbesartan (AVAPRO) 75 MG tablet Take 1 tablet (75 mg total) by mouth daily.   pantoprazole (PROTONIX) 40 MG tablet Take 1 tablet (40 mg total) by mouth every other day.   No facility-administered encounter medications on file as of 11/04/2023.    Objective:   PHYSICAL EXAMINATION:    VITALS:   Vitals:   11/04/23 0830  BP: (!) 140/70  Pulse: 68  SpO2: 96%  Weight: 184 lb (83.5 kg)  Height: 5\' 7"  (1.702 m)    GEN:  The patient appears stated age and is in NAD. HEENT:  Normocephalic, atraumatic.  The mucous membranes are moist. The superficial temporal arteries are without ropiness or tenderness. Cv:  rrr Lungs:  ctab Neck:  no bruits  He has not taken his medication yet today.   Neurological examination:  Orientation: The patient is alert and oriented x3. Cranial nerves: There is good facial symmetry with facial hypomimia. The speech is fluent and clear.  Hearing is intact to conversational tone. Sensation: Sensation is intact to light touch throughout Motor: Strength is at least antigravity x4.   Movement examination: Tone: There is mild increased tone in the RUE/RLE Abnormal movements: there is RUE rest tremor,  overall mild (same as prior) Coordination:  There is mild decremation with any form of RAMS, including alternating supination and pronation of the forearm, hand opening and closing, finger taps, heel taps and toe taps, R>L Gait and Station: The patient has no difficulty arising out of a deep-seated chair without the use of the hands. The patient's stride length is good.  He has almost no arm swing on the right.  He is very minimally dragging the right leg  I have reviewed and interpreted the following labs  independently    Chemistry      Component Value Date/Time   NA 141 05/22/2023 0802   NA 141 04/06/2013 0956   K 4.3 05/22/2023 0802   K 4.1 04/06/2013 0956   CL 102 05/22/2023 0802   CL 106 10/16/2012 1022   CO2 25 05/22/2023 0802   CO2 24 04/06/2013 0956   BUN 16 05/22/2023 0802   BUN 16.7 04/06/2013 0956   CREATININE 0.99 05/22/2023 0802   CREATININE 1.0 04/06/2013 0956      Component Value Date/Time   CALCIUM 9.8 05/22/2023 0802   CALCIUM 9.7 04/06/2013 0956   ALKPHOS 103 05/22/2023 0802   ALKPHOS 101 04/06/2013 0956   AST 14 05/22/2023 0802   AST 24 04/06/2013 0956   ALT 11 05/22/2023 0802   ALT 23 04/06/2013 0956   BILITOT 0.8 05/22/2023 0802   BILITOT 0.34 04/06/2013 0956       Lab Results  Component Value Date   WBC 6.9 05/22/2023   HGB 13.8 05/22/2023   HCT 42.9 05/22/2023   MCV 102 (H) 05/22/2023   PLT 324 05/22/2023    Lab Results  Component Value Date   TSH 1.89 12/03/2017   Total time spent on today's visit was 30 minutes, including both face-to-face time and nonface-to-face time.  Time included that spent on review of records (prior notes available to me/labs/imaging if pertinent), discussing treatment and goals, answering patient's questions and coordinating care.   Cc:  Dettinger, Elige Radon, MD

## 2023-11-04 ENCOUNTER — Encounter: Payer: Self-pay | Admitting: Neurology

## 2023-11-04 ENCOUNTER — Ambulatory Visit: Payer: Medicare Other | Admitting: Neurology

## 2023-11-04 VITALS — BP 140/70 | HR 68 | Ht 67.0 in | Wt 184.0 lb

## 2023-11-04 DIAGNOSIS — K5901 Slow transit constipation: Secondary | ICD-10-CM

## 2023-11-04 DIAGNOSIS — G20A1 Parkinson's disease without dyskinesia, without mention of fluctuations: Secondary | ICD-10-CM

## 2023-11-04 NOTE — Patient Instructions (Signed)
 ADD prunes Add miralax to coffee!  Constipation and Parkinson's disease:  1.Rancho recipe for constipation in Parkinsons Disease:  -1 cup of unprocessed bran (need to get this at [employer], [employer] or similar type of store), 2 cups of applesauce in 1 cup of prune juice 2.  Increase fiber intake (Metamucil,vegetables) 3.  Regular, moderate exercise can be beneficial. 4.  Avoid medications causing constipation, such as medications like antacids with calcium or magnesium 5.  It's okay to take daily Miralax, and taper if stools become too loose or you experience diarrhea 6.  Stool softeners (Colace) can help with chronic constipation and I recommend you take this daily. 7.  Increase water intake.  You should be drinking 1/2 gallon of water a day as long as you have not been diagnosed with congestive heart failure or renal/kidney failure.  This is probably the single greatest thing that you can do to help your constipation.

## 2023-12-04 ENCOUNTER — Encounter: Payer: Self-pay | Admitting: Family Medicine

## 2023-12-04 ENCOUNTER — Ambulatory Visit: Payer: Medicare Other | Admitting: Family Medicine

## 2023-12-04 VITALS — BP 125/70 | HR 74 | Ht 67.0 in | Wt 190.0 lb

## 2023-12-04 DIAGNOSIS — I152 Hypertension secondary to endocrine disorders: Secondary | ICD-10-CM

## 2023-12-04 DIAGNOSIS — E1169 Type 2 diabetes mellitus with other specified complication: Secondary | ICD-10-CM | POA: Diagnosis not present

## 2023-12-04 DIAGNOSIS — K219 Gastro-esophageal reflux disease without esophagitis: Secondary | ICD-10-CM

## 2023-12-04 DIAGNOSIS — Z23 Encounter for immunization: Secondary | ICD-10-CM

## 2023-12-04 DIAGNOSIS — E1159 Type 2 diabetes mellitus with other circulatory complications: Secondary | ICD-10-CM | POA: Diagnosis not present

## 2023-12-04 DIAGNOSIS — I10 Essential (primary) hypertension: Secondary | ICD-10-CM | POA: Diagnosis not present

## 2023-12-04 DIAGNOSIS — E782 Mixed hyperlipidemia: Secondary | ICD-10-CM | POA: Diagnosis not present

## 2023-12-04 DIAGNOSIS — E785 Hyperlipidemia, unspecified: Secondary | ICD-10-CM

## 2023-12-04 LAB — CBC WITH DIFFERENTIAL/PLATELET
Basophils Absolute: 0.1 10*3/uL (ref 0.0–0.2)
Basos: 1 %
EOS (ABSOLUTE): 0.1 10*3/uL (ref 0.0–0.4)
Eos: 2 %
Hematocrit: 42 % (ref 37.5–51.0)
Hemoglobin: 13.6 g/dL (ref 13.0–17.7)
Immature Grans (Abs): 0 10*3/uL (ref 0.0–0.1)
Immature Granulocytes: 0 %
Lymphocytes Absolute: 2.9 10*3/uL (ref 0.7–3.1)
Lymphs: 40 %
MCH: 32.9 pg (ref 26.6–33.0)
MCHC: 32.4 g/dL (ref 31.5–35.7)
MCV: 101 fL — ABNORMAL HIGH (ref 79–97)
Monocytes Absolute: 0.6 10*3/uL (ref 0.1–0.9)
Monocytes: 9 %
Neutrophils Absolute: 3.6 10*3/uL (ref 1.4–7.0)
Neutrophils: 48 %
Platelets: 329 10*3/uL (ref 150–450)
RBC: 4.14 x10E6/uL (ref 4.14–5.80)
RDW: 13.4 % (ref 11.6–15.4)
WBC: 7.3 10*3/uL (ref 3.4–10.8)

## 2023-12-04 LAB — BAYER DCA HB A1C WAIVED: HB A1C (BAYER DCA - WAIVED): 6.4 % — ABNORMAL HIGH (ref 4.8–5.6)

## 2023-12-04 MED ORDER — AMLODIPINE BESYLATE 5 MG PO TABS: 5.0000 mg | ORAL_TABLET | Freq: Every day | ORAL | 3 refills | Status: AC

## 2023-12-04 MED ORDER — IRBESARTAN 75 MG PO TABS
75.0000 mg | ORAL_TABLET | Freq: Every day | ORAL | 3 refills | Status: AC
Start: 2023-12-04 — End: ?

## 2023-12-04 MED ORDER — PANTOPRAZOLE SODIUM 40 MG PO TBEC: 40.0000 mg | DELAYED_RELEASE_TABLET | ORAL | 3 refills | Status: DC

## 2023-12-04 MED ORDER — ATORVASTATIN CALCIUM 40 MG PO TABS: 40.0000 mg | ORAL_TABLET | Freq: Every day | ORAL | 3 refills | Status: AC

## 2023-12-04 NOTE — Progress Notes (Signed)
 BP 125/70   Pulse 74   Ht 5\' 7"  (1.702 m)   Wt 190 lb (86.2 kg)   SpO2 96%   BMI 29.76 kg/m    Subjective:   Patient ID: Paul [name], male    DOB: September 08, 1940, 83 y.o.   MRN: [id number]  HPI: Paul [name] is a 83 y.o. male presenting on 12/04/2023 for Medical Management of Chronic Issues and Diabetes   HPI Type 2 diabetes mellitus Patient comes in today for recheck of his diabetes. Patient has been currently taking no medicine currently. Patient is currently on an ACE inhibitor/ARB. Patient has not seen an ophthalmologist this year. Patient denies any new issues with their feet. The symptom started onset as an adult hypertension and hyperlipidemia ARE RELATED TO DM   Hypertension Patient is currently on amlodipine  and irbesartan , and their blood pressure today is 125/70. Patient denies any lightheadedness or dizziness. Patient denies headaches, blurred vision, chest pains, shortness of breath, or weakness. Denies any side effects from medication and is content with current medication.   Hyperlipidemia Patient is coming in for recheck of his hyperlipidemia. The patient is currently taking atorvastatin . They deny any issues with myalgias or history of liver damage from it. They deny any focal numbness or weakness or chest pain.   Relevant past medical, surgical, family and social history reviewed and updated as indicated. Interim medical history since our last visit reviewed. Allergies and medications reviewed and updated.  Review of Systems  Constitutional:  Negative for chills and fever.  Eyes:  Negative for discharge.  Respiratory:  Negative for shortness of breath and wheezing.   Cardiovascular:  Negative for chest pain and leg swelling.  Musculoskeletal:  Negative for back pain and gait problem.  Skin:  Negative for rash.  All other systems reviewed and are negative.   Per HPI unless specifically indicated above   Allergies as of 12/04/2023       Reactions    Sulfonamide Derivatives    REACTION: rash        Medication List        Accurate as of Dec 04, 2023 10:15 AM. If you have any questions, ask your nurse or doctor.          acetaminophen  500 MG tablet Commonly known as: TYLENOL  Take 1,000 mg by mouth every 6 (six) hours as needed.   albuterol  108 (90 Base) MCG/ACT inhaler Commonly known as: VENTOLIN  HFA Inhale 2 puffs into the lungs every 6 (six) hours as needed for wheezing or shortness of breath.   amLODipine  5 MG tablet Commonly known as: NORVASC  Take 1 tablet (5 mg total) by mouth daily.   aspirin  81 MG tablet Take 1 tablet (81 mg total) by mouth daily with breakfast.   atorvastatin  40 MG tablet Commonly known as: LIPITOR Take 1 tablet (40 mg total) by mouth daily.   carbidopa -levodopa  25-100 MG tablet Commonly known as: SINEMET  IR 2 at 8am/noon/4pm   irbesartan  75 MG tablet Commonly known as: AVAPRO  Take 1 tablet (75 mg total) by mouth daily.   pantoprazole  40 MG tablet Commonly known as: PROTONIX  Take 1 tablet (40 mg total) by mouth every other day.   Vitamin D3 50 MCG (2000 UT) Tabs Take 2,000 Units by mouth daily.         Objective:   BP 125/70   Pulse 74   Ht 5\' 7"  (1.702 m)   Wt 190 lb (86.2 kg)   SpO2 96%  BMI 29.76 kg/m   Wt Readings from Last 3 Encounters:  12/04/23 190 lb (86.2 kg)  11/04/23 184 lb (83.5 kg)  08/22/23 189 lb (85.7 kg)    Physical Exam Vitals and nursing note reviewed.  Constitutional:      General: He is not in acute distress.    Appearance: He is well-developed. He is not diaphoretic.  Eyes:     General: No scleral icterus.    Conjunctiva/sclera: Conjunctivae normal.  Neck:     Thyroid : No thyromegaly.  Cardiovascular:     Rate and Rhythm: Normal rate and regular rhythm.     Heart sounds: Normal heart sounds. No murmur heard. Pulmonary:     Effort: Pulmonary effort is normal. No respiratory distress.     Breath sounds: Normal breath sounds. No wheezing.   Musculoskeletal:        General: No swelling. Normal range of motion.     Cervical back: Neck supple.  Lymphadenopathy:     Cervical: No cervical adenopathy.  Skin:    General: Skin is warm and dry.     Findings: No rash.  Neurological:     Mental Status: He is alert and oriented to person, place, and time.     Coordination: Coordination normal.  Psychiatric:        Behavior: Behavior normal.       Assessment & Plan:   Problem List Items Addressed This Visit       Cardiovascular and Mediastinum   Hypertension associated with type 2 diabetes mellitus (HCC)   Relevant Medications   amLODipine  (NORVASC ) 5 MG tablet   atorvastatin  (LIPITOR) 40 MG tablet   irbesartan  (AVAPRO ) 75 MG tablet     Digestive   GERD (gastroesophageal reflux disease)   Relevant Medications   pantoprazole  (PROTONIX ) 40 MG tablet     Endocrine   Hyperlipidemia associated with type 2 diabetes mellitus (HCC)   Relevant Medications   amLODipine  (NORVASC ) 5 MG tablet   atorvastatin  (LIPITOR) 40 MG tablet   irbesartan  (AVAPRO ) 75 MG tablet   Type 2 diabetes mellitus with other specified complication (HCC) - Primary   Relevant Medications   atorvastatin  (LIPITOR) 40 MG tablet   irbesartan  (AVAPRO ) 75 MG tablet   Other Relevant Orders   Bayer DCA Hb A1c Waived   CBC with Differential/Platelet   CMP14+EGFR   Lipid panel   Other Visit Diagnoses       Mixed hyperlipidemia       Relevant Medications   amLODipine  (NORVASC ) 5 MG tablet   atorvastatin  (LIPITOR) 40 MG tablet   irbesartan  (AVAPRO ) 75 MG tablet   Other Relevant Orders   Bayer DCA Hb A1c Waived   CBC with Differential/Platelet   CMP14+EGFR   Lipid panel     Hypertension associated with diabetes (HCC)       Relevant Medications   amLODipine  (NORVASC ) 5 MG tablet   atorvastatin  (LIPITOR) 40 MG tablet   irbesartan  (AVAPRO ) 75 MG tablet   Other Relevant Orders   Bayer DCA Hb A1c Waived   CBC with Differential/Platelet   CMP14+EGFR    Lipid panel     Primary hypertension       Relevant Medications   amLODipine  (NORVASC ) 5 MG tablet   atorvastatin  (LIPITOR) 40 MG tablet   irbesartan  (AVAPRO ) 75 MG tablet       A1c 6.4 blood pressure everything else looks good. Follow up plan: Return in about 3 months (around 03/05/2024), or if symptoms worsen or  fail to improve, for Diabetes and cholesterol hypertension recheck.  Counseling provided for all of the vaccine components Orders Placed This Encounter  Procedures   Bayer DCA Hb A1c Waived   CBC with Differential/Platelet   CMP14+EGFR   Lipid panel    Jolyne Needs, MD [facility] 12/04/2023, 10:15 AM

## 2023-12-10 ENCOUNTER — Encounter: Payer: Self-pay | Admitting: Family Medicine

## 2024-02-20 ENCOUNTER — Other Ambulatory Visit: Payer: Self-pay | Admitting: Neurology

## 2024-02-20 DIAGNOSIS — G20A1 Parkinson's disease without dyskinesia, without mention of fluctuations: Secondary | ICD-10-CM

## 2024-02-26 ENCOUNTER — Telehealth: Payer: Self-pay | Admitting: Family Medicine

## 2024-02-26 MED ORDER — PANTOPRAZOLE SODIUM 40 MG PO TBEC: 40.0000 mg | DELAYED_RELEASE_TABLET | Freq: Every day | ORAL | 3 refills | Status: AC

## 2024-02-26 NOTE — Addendum Note (Signed)
 Addended by: MICHELINE ROSINA FALCON on: 02/26/2024 01:46 PM   Modules accepted: Orders

## 2024-02-26 NOTE — Telephone Encounter (Signed)
 Called and left message to call back.

## 2024-02-26 NOTE — Telephone Encounter (Signed)
 Copied from CRM [id number]. Topic: Clinical - Medication Question >> Feb 26, 2024  9:28 AM Cherylann RAMAN wrote: Reason for CRM: Patient's wife, [facility], called and stated that the patient has had to take extra of the pantoprazole  (PROTONIX ) 40 MG tablet and he will not have enough to last him until his appt 08/01 and he is not able to get a refill until the 10th of Aug due to insurance purposes. Mrs. Wallman is requesting an emergency refill to make it to his next appt or until the next refill. Please contact Mrs. Andres at [phone] for additional information.

## 2024-02-26 NOTE — Telephone Encounter (Signed)
 Rx sent to the pharmacy.

## 2024-02-26 NOTE — Telephone Encounter (Signed)
 Needs it sent over to 1 a day so insurance will pay for it. It was sent for every other day , but he needs it more now so has been taken every day okay to send in new rx for 1 a day?

## 2024-02-26 NOTE — Telephone Encounter (Signed)
 Yes go ahead and send a new prescription of the Protonix  to take every day, give him a 90-day.

## 2024-03-01 ENCOUNTER — Telehealth: Payer: Self-pay

## 2024-03-01 NOTE — Telephone Encounter (Signed)
Noted  -LS

## 2024-03-01 NOTE — Telephone Encounter (Signed)
 Copied from CRM [id number]. Topic: Clinical - Medication Question >> Feb 26, 2024  9:28 AM Cherylann RAMAN wrote: Reason for CRM: Patient's wife, [facility], called and stated that the patient has had to take extra of the pantoprazole  (PROTONIX ) 40 MG tablet and he will not have enough to last him until his appt 08/01 and he is not able to get a refill until the 10th of Aug due to insurance purposes. Mrs. Trefz is requesting an emergency refill to make it to his next appt or until the next refill. Please contact Mrs. Mitchell at [phone] for additional information. >> Mar 01, 2024 10:48 AM Miquel SAILOR wrote: Patient wife Ronal called on update in for medication.Called office they stated refill sent.No further questions

## 2024-03-05 ENCOUNTER — Ambulatory Visit: Admitting: Family Medicine

## 2024-03-05 ENCOUNTER — Encounter: Payer: Self-pay | Admitting: Family Medicine

## 2024-03-05 VITALS — BP 132/71 | HR 68 | Ht 67.0 in | Wt 190.0 lb

## 2024-03-05 DIAGNOSIS — E1169 Type 2 diabetes mellitus with other specified complication: Secondary | ICD-10-CM | POA: Diagnosis not present

## 2024-03-05 DIAGNOSIS — K219 Gastro-esophageal reflux disease without esophagitis: Secondary | ICD-10-CM

## 2024-03-05 DIAGNOSIS — E785 Hyperlipidemia, unspecified: Secondary | ICD-10-CM

## 2024-03-05 DIAGNOSIS — E1159 Type 2 diabetes mellitus with other circulatory complications: Secondary | ICD-10-CM

## 2024-03-05 DIAGNOSIS — R600 Localized edema: Secondary | ICD-10-CM | POA: Diagnosis not present

## 2024-03-05 DIAGNOSIS — I152 Hypertension secondary to endocrine disorders: Secondary | ICD-10-CM

## 2024-03-05 LAB — BAYER DCA HB A1C WAIVED: HB A1C (BAYER DCA - WAIVED): 6.6 % — ABNORMAL HIGH (ref 4.8–5.6)

## 2024-03-05 NOTE — Progress Notes (Signed)
 BP 132/71   Pulse 68   Ht 5' 7 (1.702 m)   Wt 190 lb (86.2 kg)   SpO2 93%   BMI 29.76 kg/m    Subjective:   Patient ID: Paul [name], male    DOB: 07-21-1941, 83 y.o.   MRN: [id number]  HPI: Paul [name] is a 83 y.o. male presenting on 03/05/2024 for Medical Management of Chronic Issues, Diabetes, Hyperlipidemia, Hypertension, and Leg Swelling (BLE)   Discussed the use of AI scribe software for clinical note transcription with the patient, who gave verbal consent to proceed.  History of Present Illness   Paul [name] is an 83 year old male with diabetes and hypertension who presents for a recheck.  Diabetes type 2 recheck He no longer checks his blood sugar at home and only monitors it during visits. His hemoglobin A1c has increased slightly from 6.4 to 6.6, which he attributes to increased ice cream consumption. He is scheduled for an eye exam in October for diabetes management.  Hypertension recheck Regarding his hypertension, he mentions an episode where his blood pressure was very low, in the low hundreds over around 96, but he did not experience any lightheadedness or dizziness. He continues to take amlodipine  without any side effects.  Hyperlipidemia recheck He is on atorvastatin  for cholesterol management and reports no issues with this medication. His cholesterol levels are being checked through blood work today.  GERD He recently increased his pantoprazole  dose to 40 mg once daily for reflux, which he started this week. He is monitoring for improvement in symptoms.  He experiences some swelling in his legs. He does not currently use compression stockings.  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  No chest pain, trouble breathing, lightheadedness, or dizziness.        Relevant past medical, surgical, family and social history reviewed and updated as  indicated. Interim medical history since our last visit reviewed. Allergies and medications reviewed and updated.  Review of Systems  Constitutional:  Negative for chills and fever.  Eyes:  Negative for discharge.  Respiratory:  Negative for shortness of breath and wheezing.   Cardiovascular:  Positive for leg swelling. Negative for chest pain.  Musculoskeletal:  Negative for arthralgias, back pain and gait problem.  Skin:  Negative for rash.  All other systems reviewed and are negative.   Per HPI unless specifically indicated above   Allergies as of 03/05/2024       Reactions   Sulfonamide Derivatives    REACTION: rash        Medication List        Accurate as of March 05, 2024 10:05 AM. If you have any questions, ask your nurse or doctor.          acetaminophen  500 MG tablet Commonly known as: TYLENOL  Take 1,000 mg by mouth every 6 (six) hours as needed.   albuterol  108 (90 Base) MCG/ACT inhaler Commonly known as: VENTOLIN  HFA Inhale 2 puffs into the lungs every 6 (six) hours as needed for wheezing or shortness of breath.   amLODipine  5 MG tablet Commonly known as: NORVASC  Take 1 tablet (5 mg total) by mouth daily.   aspirin  81 MG tablet Take 1 tablet (81 mg total) by mouth daily with breakfast.   atorvastatin  40 MG tablet Commonly known as: LIPITOR Take 1 tablet (40 mg total) by mouth daily.  carbidopa -levodopa  25-100 MG tablet Commonly known as: SINEMET  IR TAKE 2 TABLETS BY MOUTH AT 8 AM, 2 TABLETS AT NOON, AND 2 TABLETS AT 4 PM   irbesartan  75 MG tablet Commonly known as: AVAPRO  Take 1 tablet (75 mg total) by mouth daily.   pantoprazole  40 MG tablet Commonly known as: PROTONIX  Take 1 tablet (40 mg total) by mouth daily.   Vitamin D3 50 MCG (2000 UT) Tabs Take 2,000 Units by mouth daily.         Objective:   BP 132/71   Pulse 68   Ht 5' 7 (1.702 m)   Wt 190 lb (86.2 kg)   SpO2 93%   BMI 29.76 kg/m   Wt Readings from Last 3  Encounters:  03/05/24 190 lb (86.2 kg)  12/04/23 190 lb (86.2 kg)  11/04/23 184 lb (83.5 kg)    Physical Exam Physical Exam   VITALS: BP- 132/71 CHEST: Lungs clear to auscultation bilaterally. CARDIOVASCULAR: Heart regular rate and rhythm. EXTREMITIES: Foot sensation intact bilaterally, no cuts or sores. SKIN: Sun damage and dry skin on back of ears.      Trace edema bilateral lower extremities  Diabetic Foot Exam - Simple   Simple Foot Form Diabetic Foot exam was performed with the following findings: Yes 03/05/2024 10:04 AM  Visual Inspection No deformities, no ulcerations, no other skin breakdown bilaterally: Yes Sensation Testing Intact to touch and monofilament testing bilaterally: Yes Pulse Check Posterior Tibialis and Dorsalis pulse intact bilaterally: Yes Comments      Assessment & Plan:   Problem List Items Addressed This Visit       Cardiovascular and Mediastinum   Hypertension associated with type 2 diabetes mellitus (HCC)     Digestive   GERD (gastroesophageal reflux disease)     Endocrine   Hyperlipidemia associated with type 2 diabetes mellitus (HCC)   Type 2 diabetes mellitus with other specified complication (HCC) - Primary   Relevant Orders   Bayer DCA Hb A1c Waived   Other Visit Diagnoses       Peripheral edema           Assessment and Plan    Type 2 diabetes mellitus Type 2 diabetes mellitus with well-controlled blood glucose. Hemoglobin A1c increased slightly from 6.4 to 6.6 but remains within target range. - Monitor hemoglobin A1c and blood glucose levels. - Advise moderation in ice cream consumption to maintain blood glucose levels. - Schedule eye exam in October for diabetes-related eye health.  Hypertension Hypertension with occasional low blood pressure readings. Current reading is 132/71 mmHg, satisfactory with no symptoms of dizziness or lightheadedness. - Continue current antihypertensive regimen with amlodipine . - Monitor blood  pressure regularly, especially if symptoms of dizziness or lightheadedness occur. - Advise to report any symptoms of lightheadedness or dizziness, particularly when standing.  Hyperlipidemia Hyperlipidemia under management with atorvastatin . Awaiting current cholesterol levels from blood work. - Continue atorvastatin  therapy. - Review cholesterol levels once blood work results are available.  Gastroesophageal reflux disease Gastroesophageal reflux disease with recent increase in pantoprazole  dosage to 40 mg daily. - Continue pantoprazole  40 mg daily. - Monitor symptoms and adjust treatment if necessary.  Lower extremity edema Mild lower extremity edema likely due to prolonged sitting and decreased activity. No signs of acute distress or complications. - Advise use of compression stockings to manage swelling. - Encourage increased physical activity, such as walking during commercial breaks or to the mailbox. - Advise elevating legs when sitting to reduce swelling.  Dry  skin and actinic keratosis of external ear Dry skin and actinic keratosis on the external ear, likely due to sun exposure and dryness. - Apply moisturizer daily to the affected area, such as Vaseline, Eucerin, or Cerave.       Follow up plan: Return in about 3 months (around 06/05/2024), or if symptoms worsen or fail to improve, for Diabetes recheck.  Counseling provided for all of the vaccine components Orders Placed This Encounter  Procedures   Bayer DCA Hb A1c Waived    Fonda Levins, MD [facility] 03/05/2024, 10:05 AM

## 2024-05-21 ENCOUNTER — Other Ambulatory Visit: Payer: Self-pay | Admitting: Neurology

## 2024-05-21 DIAGNOSIS — G20A1 Parkinson's disease without dyskinesia, without mention of fluctuations: Secondary | ICD-10-CM

## 2024-05-28 NOTE — Progress Notes (Signed)
 Assessment/Plan:   1.  Parkinsons Disease  -continue carbidopa /levodopa  25/100, 2 tablet at 8 AM/noon/4 PM.   -we discussed adding CR at bedtime for foot/toe cramping but he decided to hold on that for now as sx's not that bad.    -We discussed that it used to be thought that levodopa  would increase risk of melanoma but now it is believed that Parkinsons itself likely increases risk of melanoma. he is to get regular skin checks. We discussed last visit as well  2.  Constipation  -discussed nature and pathophysiology and association with PD  -needs to increase exercise, hydration and daily miralax   -refer to GI  Subjective:   Paul [name] was seen today in follow up for Parkinsons disease.  My previous records were reviewed prior to todays visit as well as outside records available to me. Pt with wife who supplements hx. patient remains on his levodopa .  Some cramping of the feet/toes about 2 times per week.   He has had no near syncope.  No falls.  No hallucinations.  Saw his primary care physician August 1.  Those notes are reviewed.  He c/o constipation.  Given rancho recipe last visit and told to do daily miralax  - he quit doing it because it isn't doing no good.  Current prescribed movement disorder medications: Carbidopa /levodopa  25/100, 2 tablet 3 times per day   ALLERGIES:   Allergies  Allergen Reactions   Sulfonamide Derivatives     REACTION: rash    CURRENT MEDICATIONS:  Outpatient Encounter Medications as of 06/01/2024  Medication Sig   acetaminophen  (TYLENOL ) 500 MG tablet Take 1,000 mg by mouth every 6 (six) hours as needed.   albuterol  (VENTOLIN  HFA) 108 (90 Base) MCG/ACT inhaler Inhale 2 puffs into the lungs every 6 (six) hours as needed for wheezing or shortness of breath.   amLODipine  (NORVASC ) 5 MG tablet Take 1 tablet (5 mg total) by mouth daily.   aspirin  81 MG tablet Take 1 tablet (81 mg total) by mouth daily with breakfast.   atorvastatin  (LIPITOR) 40  MG tablet Take 1 tablet (40 mg total) by mouth daily.   carbidopa -levodopa  (SINEMET  IR) 25-100 MG tablet TAKE 2 TABLETS BY MOUTH THREE TIMES DAILY (8 AM, NOON, AND 4 PM)   Cholecalciferol (VITAMIN D3) 2000 UNITS TABS Take 2,000 Units by mouth daily.   irbesartan  (AVAPRO ) 75 MG tablet Take 1 tablet (75 mg total) by mouth daily.   pantoprazole  (PROTONIX ) 40 MG tablet Take 1 tablet (40 mg total) by mouth daily.   No facility-administered encounter medications on file as of 06/01/2024.    Objective:   PHYSICAL EXAMINATION:    VITALS:   Vitals:   06/01/24 0900  BP: 126/84  Pulse: 71  SpO2: 96%  Weight: 191 lb 9.6 oz (86.9 kg)  Height: 5' 7 (1.702 m)   GEN:  The patient appears stated age and is in NAD. HEENT:  Normocephalic, atraumatic.  The mucous membranes are moist. The superficial temporal arteries are without ropiness or tenderness. Cv:  rrr Lungs:  ctab Neck:  no bruits  Neurological examination:  Orientation: The patient is alert and oriented x3. Cranial nerves: There is good facial symmetry with facial hypomimia. The speech is fluent and clear.  Hearing is intact to conversational tone. Sensation: Sensation is intact to light touch throughout Motor: Strength is at least antigravity x4.   Movement examination: Tone: There is mild increased tone in the RUE/RLE (stable) Abnormal movements: there is RUE  rest tremor, overall mild (same as prior) Coordination:  There is minimal decremation with any form of RAMS, including alternating supination and pronation of the forearm, hand opening and closing, finger taps, heel taps and toe taps, R>L Gait and Station: The patient has no difficulty arising out of a deep-seated chair without the use of the hands. The patient's stride length is good.  He has almost no arm swing on the right.  He is very minimally dragging the right leg - stable  I have reviewed and interpreted the following labs independently    Chemistry      Component  Value Date/Time   NA 141 05/22/2023 0802   NA 141 04/06/2013 0956   K 4.3 05/22/2023 0802   K 4.1 04/06/2013 0956   CL 102 05/22/2023 0802   CL 106 10/16/2012 1022   CO2 25 05/22/2023 0802   CO2 24 04/06/2013 0956   BUN 16 05/22/2023 0802   BUN 16.7 04/06/2013 0956   CREATININE 0.99 05/22/2023 0802   CREATININE 1.0 04/06/2013 0956      Component Value Date/Time   CALCIUM  9.8 05/22/2023 0802   CALCIUM  9.7 04/06/2013 0956   ALKPHOS 103 05/22/2023 0802   ALKPHOS 101 04/06/2013 0956   AST 14 05/22/2023 0802   AST 24 04/06/2013 0956   ALT 11 05/22/2023 0802   ALT 23 04/06/2013 0956   BILITOT 0.8 05/22/2023 0802   BILITOT 0.34 04/06/2013 0956       Lab Results  Component Value Date   WBC 7.3 12/04/2023   HGB 13.6 12/04/2023   HCT 42.0 12/04/2023   MCV 101 (H) 12/04/2023   PLT 329 12/04/2023    Lab Results  Component Value Date   TSH 1.89 12/03/2017   Total time spent on today's visit was 30 minutes, including both face-to-face time and nonface-to-face time.  Time included that spent on review of records (prior notes available to me/labs/imaging if pertinent), discussing treatment and goals, answering patient's questions and coordinating care.   Cc:  Dettinger, Fonda LABOR, MD

## 2024-06-01 ENCOUNTER — Ambulatory Visit: Admitting: Neurology

## 2024-06-01 ENCOUNTER — Encounter: Payer: Self-pay | Admitting: Neurology

## 2024-06-01 VITALS — BP 126/84 | HR 71 | Ht 67.0 in | Wt 191.6 lb

## 2024-06-01 DIAGNOSIS — K5901 Slow transit constipation: Secondary | ICD-10-CM | POA: Diagnosis not present

## 2024-06-01 DIAGNOSIS — G20A1 Parkinson's disease without dyskinesia, without mention of fluctuations: Secondary | ICD-10-CM

## 2024-06-01 NOTE — Patient Instructions (Signed)
 [facility] Located in: [facility] Address: [address] [place], [place], [place] [address] Phone: [phone]

## 2024-06-03 ENCOUNTER — Ambulatory Visit (INDEPENDENT_AMBULATORY_CARE_PROVIDER_SITE_OTHER)

## 2024-06-03 DIAGNOSIS — Z23 Encounter for immunization: Secondary | ICD-10-CM | POA: Diagnosis not present

## 2024-06-10 ENCOUNTER — Ambulatory Visit: Admitting: Family Medicine

## 2024-06-10 DIAGNOSIS — N200 Calculus of kidney: Secondary | ICD-10-CM | POA: Diagnosis not present

## 2024-06-10 DIAGNOSIS — C61 Malignant neoplasm of prostate: Secondary | ICD-10-CM | POA: Diagnosis not present

## 2024-06-10 DIAGNOSIS — G20A2 Parkinson's disease without dyskinesia, with fluctuations: Secondary | ICD-10-CM | POA: Diagnosis not present

## 2024-06-11 NOTE — Progress Notes (Signed)
 Paul [name]                                          MRN: [id number]   06/11/2024   The [facility] Quality Team Specialist reviewed this patient medical record for the purposes of chart review for care gap closure. The following were reviewed: chart review for care gap closure-kidney health evaluation for diabetes:eGFR  and uACR.    [facility] Quality Team

## 2024-06-14 DIAGNOSIS — K5900 Constipation, unspecified: Secondary | ICD-10-CM | POA: Diagnosis not present

## 2024-06-16 ENCOUNTER — Encounter: Payer: Self-pay | Admitting: Family Medicine

## 2024-06-16 ENCOUNTER — Ambulatory Visit (INDEPENDENT_AMBULATORY_CARE_PROVIDER_SITE_OTHER): Payer: Self-pay | Admitting: Family Medicine

## 2024-06-16 VITALS — BP 120/72 | HR 71 | Ht 67.0 in | Wt 187.0 lb

## 2024-06-16 DIAGNOSIS — E1169 Type 2 diabetes mellitus with other specified complication: Secondary | ICD-10-CM

## 2024-06-16 DIAGNOSIS — E1159 Type 2 diabetes mellitus with other circulatory complications: Secondary | ICD-10-CM | POA: Diagnosis not present

## 2024-06-16 DIAGNOSIS — E785 Hyperlipidemia, unspecified: Secondary | ICD-10-CM | POA: Diagnosis not present

## 2024-06-16 DIAGNOSIS — I152 Hypertension secondary to endocrine disorders: Secondary | ICD-10-CM | POA: Diagnosis not present

## 2024-06-16 LAB — BAYER DCA HB A1C WAIVED: HB A1C (BAYER DCA - WAIVED): 5.5 % (ref 4.8–5.6)

## 2024-06-16 NOTE — Progress Notes (Signed)
 BP 120/72   Pulse 71   Ht 5' 7 (1.702 m)   Wt 187 lb (84.8 kg)   SpO2 95%   BMI 29.29 kg/m    Subjective:   Patient ID: Paul [name], male    DOB: June 18, 1941, 83 y.o.   MRN: [id number]  HPI: Paul [name] is a 83 y.o. male presenting on 06/16/2024 for Medical Management of Chronic Issues and Diabetes   Discussed the use of AI scribe software for clinical note transcription with the patient, who gave verbal consent to proceed.  History of Present Illness   The patient presents for a recheck of diabetes and hypertension management.  Glycemic control - No current use of diabetes medication - Does not check blood glucose regularly - Hemoglobin A1c remains stable at 5.5% over the past three months  Hypertension management - Currently taking amlodipine  and irbesartan  - Blood pressure measured at 120/72 mmHg at today's visit  Hyperlipidemia management - Continues atorvastatin  therapy for cholesterol management  Parkinsonism - Continues to follow with neurology - Currently taking medication for Parkinson's disease - Has a pedal exerciser but does not use it regularly  Peripheral edema - Mild swelling in the legs - No significant pain or discomfort - No back pain          Relevant past medical, surgical, family and social history reviewed and updated as indicated. Interim medical history since our last visit reviewed. Allergies and medications reviewed and updated.  Review of Systems  Constitutional:  Negative for chills and fever.  Eyes:  Negative for visual disturbance.  Respiratory:  Negative for shortness of breath and wheezing.   Cardiovascular:  Negative for chest pain and leg swelling.  Musculoskeletal:  Negative for back pain and gait problem.  Skin:  Negative for rash.  Neurological:  Negative for dizziness and light-headedness.  All other systems reviewed and are negative.   Per HPI unless specifically indicated above   Allergies as of  06/16/2024       Reactions   Sulfonamide Derivatives    REACTION: rash        Medication List        Accurate as of June 16, 2024 11:43 AM. If you have any questions, ask your nurse or doctor.          acetaminophen  500 MG tablet Commonly known as: TYLENOL  Take 1,000 mg by mouth every 6 (six) hours as needed.   albuterol  108 (90 Base) MCG/ACT inhaler Commonly known as: VENTOLIN  HFA Inhale 2 puffs into the lungs every 6 (six) hours as needed for wheezing or shortness of breath.   amLODipine  5 MG tablet Commonly known as: NORVASC  Take 1 tablet (5 mg total) by mouth daily.   aspirin  81 MG tablet Take 1 tablet (81 mg total) by mouth daily with breakfast.   atorvastatin  40 MG tablet Commonly known as: LIPITOR Take 1 tablet (40 mg total) by mouth daily.   carbidopa -levodopa  25-100 MG tablet Commonly known as: SINEMET  IR TAKE 2 TABLETS BY MOUTH THREE TIMES DAILY (8 AM, NOON, AND 4 PM)   irbesartan  75 MG tablet Commonly known as: AVAPRO  Take 1 tablet (75 mg total) by mouth daily.   pantoprazole  40 MG tablet Commonly known as: PROTONIX  Take 1 tablet (40 mg total) by mouth daily.   Vitamin D3 50 MCG (2000 UT) Tabs Take 2,000 Units by mouth daily.         Objective:   BP 120/72   Pulse 71  Ht 5' 7 (1.702 m)   Wt 187 lb (84.8 kg)   SpO2 95%   BMI 29.29 kg/m   Wt Readings from Last 3 Encounters:  06/16/24 187 lb (84.8 kg)  06/01/24 191 lb 9.6 oz (86.9 kg)  03/05/24 190 lb (86.2 kg)    Physical Exam Physical Exam   VITALS: BP- 120/72 NECK: Thyroid  without lumps or nodules. CHEST: Lungs clear to auscultation bilaterally. CARDIOVASCULAR: Heart regular rate and rhythm. EXTREMITIES: Good pulses, mild swelling in legs.       Results for orders placed or performed in visit on 03/05/24  HM DIABETES EYE EXAM   Collection Time: 07/17/23 12:44 PM  Result Value Ref Range   HM Diabetic Eye Exam No Retinopathy No Retinopathy    Assessment & Plan:    Problem List Items Addressed This Visit       Cardiovascular and Mediastinum   Hypertension associated with type 2 diabetes mellitus (HCC) - Primary   Relevant Orders   CBC with Differential/Platelet   CMP14+EGFR   Lipid panel   TSH   Bayer DCA Hb A1c Waived     Endocrine   Hyperlipidemia associated with type 2 diabetes mellitus (HCC)   Relevant Orders   CBC with Differential/Platelet   CMP14+EGFR   Lipid panel   TSH   Bayer DCA Hb A1c Waived   Type 2 diabetes mellitus with other specified complication (HCC)   Relevant Orders   Microalbumin / creatinine urine ratio        Type 2 diabetes mellitus Well-controlled with an A1c of 5.5%. - Continue current management without medication.  Essential hypertension Well-controlled with a blood pressure reading of 120/72 mmHg. - Continue current medications: amlodipine  and irbesartan .  Hyperlipidemia Managed with atorvastatin . - Continue atorvastatin .  Parkinson's disease Well-managed with current medication regimen. Emphasized regular exercise for strength and mobility. - Encouraged regular use of pedal exerciser 3-4 times a week. - Encouraged gradual increase in exercise duration to 20-30 minutes daily.  Lower extremity edema Mild edema likely due to prolonged sitting. - Encouraged more walking and exercise to reduce swelling.  General Health Maintenance Discussed COVID-19 booster and RSV vaccination due to higher risk status. - Recommended annual COVID-19 booster. - Confirmed RSV vaccination status.          Follow up plan: Return in about 3 months (around 09/16/2024), or if symptoms worsen or fail to improve, for Diabetes recheck.  Counseling provided for all of the vaccine components Orders Placed This Encounter  Procedures   CBC with Differential/Platelet   CMP14+EGFR   Lipid panel   TSH   Bayer DCA Hb A1c Waived   Microalbumin / creatinine urine ratio    Fonda Levins, MD [facility]  Family Medicine 06/16/2024, 11:43 AM

## 2024-06-17 LAB — CBC WITH DIFFERENTIAL/PLATELET
Basophils Absolute: 0.1 x10E3/uL (ref 0.0–0.2)
Basos: 1 %
EOS (ABSOLUTE): 0.1 x10E3/uL (ref 0.0–0.4)
Eos: 1 %
Hematocrit: 41.8 % (ref 37.5–51.0)
Hemoglobin: 13.6 g/dL (ref 13.0–17.7)
Immature Grans (Abs): 0 x10E3/uL (ref 0.0–0.1)
Immature Granulocytes: 0 %
Lymphocytes Absolute: 3 x10E3/uL (ref 0.7–3.1)
Lymphs: 36 %
MCH: 33.5 pg — ABNORMAL HIGH (ref 26.6–33.0)
MCHC: 32.5 g/dL (ref 31.5–35.7)
MCV: 103 fL — ABNORMAL HIGH (ref 79–97)
Monocytes Absolute: 0.7 x10E3/uL (ref 0.1–0.9)
Monocytes: 8 %
Neutrophils Absolute: 4.5 x10E3/uL (ref 1.4–7.0)
Neutrophils: 54 %
Platelets: 371 x10E3/uL (ref 150–450)
RBC: 4.06 x10E6/uL — ABNORMAL LOW (ref 4.14–5.80)
RDW: 13 % (ref 11.6–15.4)
WBC: 8.3 x10E3/uL (ref 3.4–10.8)

## 2024-06-17 LAB — CMP14+EGFR
ALT: 5 IU/L (ref 0–44)
AST: 15 IU/L (ref 0–40)
Albumin: 4.3 g/dL (ref 3.7–4.7)
Alkaline Phosphatase: 95 IU/L (ref 48–129)
BUN/Creatinine Ratio: 15 (ref 10–24)
BUN: 14 mg/dL (ref 8–27)
Bilirubin Total: 0.8 mg/dL (ref 0.0–1.2)
CO2: 25 mmol/L (ref 20–29)
Calcium: 9.7 mg/dL (ref 8.6–10.2)
Chloride: 101 mmol/L (ref 96–106)
Creatinine, Ser: 0.93 mg/dL (ref 0.76–1.27)
Globulin, Total: 2.7 g/dL (ref 1.5–4.5)
Glucose: 120 mg/dL — ABNORMAL HIGH (ref 70–99)
Potassium: 4.8 mmol/L (ref 3.5–5.2)
Sodium: 139 mmol/L (ref 134–144)
Total Protein: 7 g/dL (ref 6.0–8.5)
eGFR: 82 mL/min/1.73 (ref >59)

## 2024-06-17 LAB — LIPID PANEL
Chol/HDL Ratio: 2.4 ratio (ref 0.0–5.0)
Cholesterol, Total: 147 mg/dL (ref 100–199)
HDL: 61 mg/dL (ref >39)
LDL Chol Calc (NIH): 68 mg/dL (ref 0–99)
Triglycerides: 96 mg/dL (ref 0–149)
VLDL Cholesterol Cal: 18 mg/dL (ref 5–40)

## 2024-06-17 LAB — TSH: TSH: 1.35 u[IU]/mL (ref 0.450–4.500)

## 2024-06-17 LAB — MICROALBUMIN / CREATININE URINE RATIO
Creatinine, Urine: 176.8 mg/dL
Microalb/Creat Ratio: 17 mg/g{creat} (ref 0–29)
Microalbumin, Urine: 30.3 ug/mL

## 2024-06-24 ENCOUNTER — Ambulatory Visit: Payer: Self-pay | Admitting: Family Medicine

## 2024-06-30 ENCOUNTER — Ambulatory Visit: Payer: Medicare Other

## 2024-06-30 VITALS — BP 120/72 | HR 71 | Ht 67.0 in | Wt 187.0 lb

## 2024-06-30 DIAGNOSIS — Z0000 Encounter for general adult medical examination without abnormal findings: Secondary | ICD-10-CM

## 2024-06-30 NOTE — Progress Notes (Signed)
 Chief Complaint  Patient presents with   Medicare Wellness     Subjective:   Paul [name] is a 83 y.o. male who presents for a Medicare Annual Wellness Visit.  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; s/p prostatectomy; involving both lobes; No extraprostatic involvement; neg margin;  pT2c, pN0, M0    Diabetes mellitus without complication (HCC)    Full dentures    does not wear bottoms   GERD (gastroesophageal reflux disease)    Hyperlipidemia    Hypertension    Iron deficiency anemia    presumped malabsorption; neg EGD/ capsule endos/colosopcy   Iron deficiency anemia, unspecified 10/16/2012   Kidney stones    Parkinson's disease (HCC)    Past Surgical History:  Procedure Laterality Date   COLONOSCOPY     HEMORRHOID SURGERY N/A 07/05/2014   Procedure: HEMORRHOIDECTOMY;  Surgeon: Debby Shipper, MD;  Location: [facility];  Service: General;  Laterality: N/A;   ORIF RADIAL FRACTURE     right-as child   robtic assisted laparoscopic     radical prostatectomy and bilateral pelvic lymphadenectomy   UPPER GI ENDOSCOPY     Family History  Problem Relation Age of Onset   Heart failure Mother    Diabetes type II Mother    Diabetes Mother    Heart attack Father    Diabetes Brother    Diabetes Sister    Cancer Sister        colon   Diabetes Sister    Diabetes Brother    Cancer Brother        bone and leukemia   Diabetes Brother    Diabetes Brother    Cancer Brother    Healthy Son        over [employer]    Social History   Occupational History   Occupation: retired    Comment: holiday representative work  Tobacco Use   Smoking status: Former    Current packs/day: 0.00    Average packs/day: 1 pack/day for 25.0 years (25.0 ttl pk-yrs)    Types: Cigarettes    Start date: 09/18/1956    Quit date: 09/18/1981    Years since quitting: 42.8   Smokeless  tobacco: Never  Vaping Use   Vaping status: Never Used  Substance and Sexual Activity   Alcohol use: No   Drug use: No   Sexual activity: Yes   Tobacco Counseling Counseling given: Yes  SDOH Screenings   Food Insecurity: No Food Insecurity (06/30/2024)  Housing: Low Risk  (06/30/2024)  Transportation Needs: No Transportation Needs (06/30/2024)  Utilities: Not At Risk (06/30/2024)  Alcohol Screen: Low Risk  (07/31/2022)  Depression (PHQ2-9): Low Risk  (06/30/2024)  Financial Resource Strain: Low Risk  (03/04/2024)  Physical Activity: Insufficiently Active (06/30/2024)  Social Connections: Socially Integrated (06/30/2024)  Stress: No Stress Concern Present (06/30/2024)  Tobacco Use: Medium Risk (06/30/2024)  Health Literacy: Adequate Health Literacy (06/30/2024)   See flowsheets for full screening details  Depression Screen PHQ 2 & 9 Depression Scale- Over the past 2 weeks, how often have you been bothered by any of the following problems? Little interest or pleasure in doing things: 0 Feeling down, depressed, or hopeless (PHQ Adolescent also includes...irritable): 0 PHQ-2 Total Score: 0     Goals Addressed             This Visit's Progress    AWV  On track    06/28/2019 AWV Goal: Fall Prevention  Over the next year, patient will decrease their risk for falls by: Using assistive devices, such as a cane or walker, as needed Identifying fall risks within their home and correcting them by: Removing throw rugs Adding handrails to stairs or ramps Removing clutter and keeping a clear pathway throughout the home Increasing light, especially at night Adding shower handles/bars Raising toilet seat Identifying potential personal risk factors for falls: Medication side effects Incontinence/urgency Vestibular dysfunction Hearing loss Musculoskeletal disorders Neurological disorders Orthostatic hypotension         Visit info / Clinical Intake: Medicare Wellness  Visit Type:: Subsequent Annual Wellness Visit Persons participating in visit:: patient Medicare Wellness Visit Mode:: Telephone If telephone:: video declined Because this visit was a virtual/telehealth visit:: vitals recorded from last visit If Telephone or Video please confirm:: I connected with the patient using audio enabled telemedicine application and verified that I am speaking with the correct person using two identifiers Patient Location:: home Provider Location:: office Information given by:: patient Interpreter Needed?: No Pre-visit prep was completed: yes AWV questionnaire completed by patient prior to visit?: no Living arrangements:: lives with spouse/significant other Patient's Overall Health Status Rating: very good Typical amount of pain: none Does pain affect daily life?: no Are you currently prescribed opioids?: no  Dietary Habits and Nutritional Risks How many meals a day?: 3 Eats fruit and vegetables daily?: yes Most meals are obtained by: preparing own meals In the last 2 weeks, have you had any of the following?: none Diabetic:: no  Functional Status Activities of Daily Living (to include ambulation/medication): Independent Ambulation: Independent Medication Administration: Needs assistance (comment) (pt's wife) Home Management: Needs assistance (comment) (pt's wife) Manage your own finances?: (!) no (pt's wife) Primary transportation is: driving Concerns about hearing?: no  Fall Screening Falls in the past year?: 0 Number of falls in past year: 0 Was there an injury with Fall?: 0 Fall Risk Category Calculator: 0 Patient Fall Risk Level: Low Fall Risk  Fall Risk Patient at Risk for Falls Due to: No Fall Risks Fall risk Follow up: Falls evaluation completed; Education provided  Home and Transportation Safety: All rugs have non-skid backing?: yes All stairs or steps have railings?: (!) no Grab bars in the bathtub or shower?: (!) no Have non-skid  surface in bathtub or shower?: (!) no Good home lighting?: yes Regular seat belt use?: yes Hospital stays in the last year:: no  Cognitive Assessment Difficulty concentrating, remembering, or making decisions? : no Will 6CIT or Mini Cog be Completed: yes What year is it?: 0 points What month is it?: 0 points Give patient an address phrase to remember (5 components): [address] [place] [address] About what time is it?: 0 points Count backwards from 20 to 1: 0 points Say the months of the year in reverse: 0 points Repeat the address phrase from earlier: 8 points 6 CIT Score: 8 points  Advance Directives (For Healthcare) Does Patient Have a Medical Advance Directive?: Yes Type of Advance Directive: Living will Copy of Living Will in Chart?: No - copy requested Would patient like information on creating a medical advance directive?: No - Patient declined  Reviewed/Updated  Reviewed/Updated: Reviewed All (Medical, Surgical, Family, Medications, Allergies, Care Teams, Patient Goals); Medical History; Surgical History; Family History; Medications; Allergies; Care Teams; Patient Goals        Objective:    Today's Vitals   06/30/24 1313  BP: 120/72  Pulse:  71  Weight: 187 lb (84.8 kg)  Height: 5' 7 (1.702 m)   Body mass index is 29.29 kg/m.  Current Medications (verified) Outpatient Encounter Medications as of 06/30/2024  Medication Sig   acetaminophen  (TYLENOL ) 500 MG tablet Take 1,000 mg by mouth every 6 (six) hours as needed.   albuterol  (VENTOLIN  HFA) 108 (90 Base) MCG/ACT inhaler Inhale 2 puffs into the lungs every 6 (six) hours as needed for wheezing or shortness of breath.   amLODipine  (NORVASC ) 5 MG tablet Take 1 tablet (5 mg total) by mouth daily.   aspirin  81 MG tablet Take 1 tablet (81 mg total) by mouth daily with breakfast.   atorvastatin  (LIPITOR) 40 MG tablet Take 1 tablet (40 mg total) by mouth daily.   carbidopa -levodopa  (SINEMET  IR) 25-100 MG tablet  TAKE 2 TABLETS BY MOUTH THREE TIMES DAILY (8 AM, NOON, AND 4 PM)   Cholecalciferol (VITAMIN D3) 2000 UNITS TABS Take 2,000 Units by mouth daily.   irbesartan  (AVAPRO ) 75 MG tablet Take 1 tablet (75 mg total) by mouth daily.   pantoprazole  (PROTONIX ) 40 MG tablet Take 1 tablet (40 mg total) by mouth daily.   No facility-administered encounter medications on file as of 06/30/2024.   Hearing/Vision screen Hearing Screening - Comments:: Pt denies hearing dif Vision Screening - Comments:: Pt wear reading glasses/pt goes [facility] [place]/last of upcoming appt in 12/25 Immunizations and Health Maintenance Health Maintenance  Topic Date Due   COVID-19 Vaccine (4 - 2025-26 season) 04/05/2024   OPHTHALMOLOGY EXAM  07/16/2024   HEMOGLOBIN A1C  12/14/2024   FOOT EXAM  03/05/2025   Diabetic kidney evaluation - eGFR measurement  06/16/2025   Diabetic kidney evaluation - Urine ACR  06/16/2025   Medicare Annual Wellness (AWV)  06/30/2025   DTaP/Tdap/Td (3 - Td or Tdap) 12/03/2033   Pneumococcal Vaccine: 50+ Years  Completed   Influenza Vaccine  Completed   Zoster Vaccines- Shingrix   Completed   Meningococcal B Vaccine  Aged Out        Assessment/Plan:  This is a routine wellness examination for Elsie.  Patient Care Team: Dettinger, Fonda LABOR, MD as PCP - General (Family Medicine) Dallis Prentice JAYSON DEVONNA as Physician Assistant (Physician Assistant) Georgina Nancyann ORN, MD (Inactive) as Attending Physician (Family Medicine) Nicholaus Tanda LITTIE DOUGLAS, MD as Attending Physician (Urology) Tat, Asberry RAMAN, DO as Consulting Physician (Neurology)  I have personally reviewed and noted the following in the patient's chart:   Medical and social history Use of alcohol, tobacco or illicit drugs  Current medications and supplements including opioid prescriptions. Functional ability and status Nutritional status Physical activity Advanced directives List of other physicians Hospitalizations, surgeries,  and ER visits in previous 12 months Vitals Screenings to include cognitive, depression, and falls Referrals and appointments  No orders of the defined types were placed in this encounter.  In addition, I have reviewed and discussed with patient certain preventive protocols, quality metrics, and best practice recommendations. A written personalized care plan for preventive services as well as general preventive health recommendations were provided to patient.   Ozie Ned, CMA   06/30/2024   Return in 1 year (on 06/30/2025).  After Visit Summary: (MyChart) Due to this being a telephonic visit, the after visit summary with patients personalized plan was offered to patient via MyChart   Nurse Notes: pt is aware and due the covid vaccine, per pt plan on getting it.

## 2024-07-13 DIAGNOSIS — K5900 Constipation, unspecified: Secondary | ICD-10-CM | POA: Diagnosis not present

## 2024-07-20 DIAGNOSIS — H5203 Hypermetropia, bilateral: Secondary | ICD-10-CM | POA: Diagnosis not present

## 2024-07-20 LAB — OPHTHALMOLOGY REPORT-SCANNED

## 2024-08-20 ENCOUNTER — Other Ambulatory Visit: Payer: Self-pay | Admitting: Neurology

## 2024-08-20 DIAGNOSIS — G20A1 Parkinson's disease without dyskinesia, without mention of fluctuations: Secondary | ICD-10-CM

## 2024-09-20 ENCOUNTER — Ambulatory Visit: Admitting: Family Medicine

## 2024-12-02 ENCOUNTER — Ambulatory Visit: Admitting: Neurology

## 2025-07-06 ENCOUNTER — Ambulatory Visit
